# Patient Record
Sex: Male | Born: 1965 | Race: White | Hispanic: No | Marital: Married | Smoking: Former smoker
Health system: Southern US, Community
[De-identification: ages and names within clinical notes are randomized; demographics above are authoritative.]

## PROBLEM LIST (undated history)

## (undated) DIAGNOSIS — F32A Depression, unspecified: Secondary | ICD-10-CM

## (undated) DIAGNOSIS — F419 Anxiety disorder, unspecified: Secondary | ICD-10-CM

## (undated) DIAGNOSIS — L509 Urticaria, unspecified: Secondary | ICD-10-CM

## (undated) DIAGNOSIS — G4730 Sleep apnea, unspecified: Secondary | ICD-10-CM

## (undated) DIAGNOSIS — M2550 Pain in unspecified joint: Secondary | ICD-10-CM

## (undated) DIAGNOSIS — J45909 Unspecified asthma, uncomplicated: Secondary | ICD-10-CM

## (undated) DIAGNOSIS — M459 Ankylosing spondylitis of unspecified sites in spine: Secondary | ICD-10-CM

## (undated) DIAGNOSIS — F988 Other specified behavioral and emotional disorders with onset usually occurring in childhood and adolescence: Secondary | ICD-10-CM

## (undated) DIAGNOSIS — M549 Dorsalgia, unspecified: Secondary | ICD-10-CM

## (undated) DIAGNOSIS — K589 Irritable bowel syndrome without diarrhea: Secondary | ICD-10-CM

## (undated) DIAGNOSIS — K760 Fatty (change of) liver, not elsewhere classified: Secondary | ICD-10-CM

## (undated) DIAGNOSIS — F514 Sleep terrors [night terrors]: Secondary | ICD-10-CM

## (undated) DIAGNOSIS — F1990 Other psychoactive substance use, unspecified, uncomplicated: Secondary | ICD-10-CM

## (undated) DIAGNOSIS — F329 Major depressive disorder, single episode, unspecified: Secondary | ICD-10-CM

## (undated) DIAGNOSIS — F1010 Alcohol abuse, uncomplicated: Secondary | ICD-10-CM

## (undated) DIAGNOSIS — E039 Hypothyroidism, unspecified: Secondary | ICD-10-CM

## (undated) HISTORY — PX: WISDOM TOOTH EXTRACTION: SHX21

## (undated) HISTORY — DX: Sleep terrors (night terrors): F51.4

## (undated) HISTORY — PX: SINOSCOPY: SHX187

## (undated) HISTORY — DX: Fatty (change of) liver, not elsewhere classified: K76.0

## (undated) HISTORY — DX: Other specified behavioral and emotional disorders with onset usually occurring in childhood and adolescence: F98.8

## (undated) HISTORY — PX: ADENOIDECTOMY: SUR15

## (undated) HISTORY — DX: Alcohol abuse, uncomplicated: F10.10

## (undated) HISTORY — DX: Urticaria, unspecified: L50.9

## (undated) HISTORY — PX: TONSILLECTOMY: SUR1361

## (undated) HISTORY — DX: Dorsalgia, unspecified: M54.9

## (undated) HISTORY — DX: Other psychoactive substance use, unspecified, uncomplicated: F19.90

## (undated) HISTORY — PX: KNEE SURGERY: SHX244

## (undated) HISTORY — DX: Irritable bowel syndrome, unspecified: K58.9

## (undated) HISTORY — DX: Pain in unspecified joint: M25.50

---

## 1990-01-13 HISTORY — PX: TONSILLECTOMY AND ADENOIDECTOMY: SHX28

## 2001-12-23 ENCOUNTER — Emergency Department (HOSPITAL_COMMUNITY): Admission: EM | Admit: 2001-12-23 | Discharge: 2001-12-23 | Payer: Self-pay | Admitting: *Deleted

## 2009-03-26 ENCOUNTER — Encounter: Admission: RE | Admit: 2009-03-26 | Discharge: 2009-03-26 | Payer: Self-pay | Admitting: Family Medicine

## 2009-04-04 ENCOUNTER — Encounter: Admission: RE | Admit: 2009-04-04 | Discharge: 2009-04-04 | Payer: Self-pay | Admitting: Family Medicine

## 2010-01-23 ENCOUNTER — Ambulatory Visit
Admission: RE | Admit: 2010-01-23 | Discharge: 2010-01-23 | Payer: Self-pay | Source: Home / Self Care | Attending: Orthopedic Surgery | Admitting: Orthopedic Surgery

## 2010-01-28 LAB — POCT HEMOGLOBIN-HEMACUE: Hemoglobin: 16.4 g/dL (ref 13.0–17.0)

## 2010-02-01 NOTE — Op Note (Signed)
NAMESELSO, MANNOR NO.:  [id number]  MEDICAL RECORD NO.:  [id number]          PATIENT TYPE:  AMB  LOCATION:  DSC                          FACILITY:  [facility]  PHYSICIAN:  Harvie Junior, M.D.   DATE OF BIRTH:  10-Mar-1965  DATE OF PROCEDURE:  01/23/2010 DATE OF DISCHARGE:                              OPERATIVE REPORT   This is a 45 year old man.  PREOPERATIVE DIAGNOSIS:  Medial meniscal tear with suspected proliferative synovial process.  POSTOPERATIVE DIAGNOSES: 1. Medial meniscal tear. 2. Severe chondromalacia of the patellofemoral joint. 3. Proliferative synovium. 4. Medial shelf plica.  OPERATIVE PROCEDURES: 1. Partial posterior horn medial meniscectomy. 2. A chondroplasty of patellofemoral joint. 3. Extensive four-compartment synovectomy. 4. Excision of medial shelf plica.  SURGEON:  Harvie Junior, MD  ASSISTANT:  Marshia Ly, PA  ANESTHESIA:  General.  BRIEF HISTORY:  Mr. Scholze is a 45 year old male with a long history of having had significant pain in his left knee.  We treated him conservatively for a period time with aspiration, injection therapy, activity modification.  With failure of all of these issues and because of concerns of his rheumatologist that he could have some proliferative synovial process, he was ultimately taken to the operating room for evaluation and fixation as needed.  PROCEDURE:  The patient was taken to operating room.  After adequate anesthesia obtained with general anesthetic, the patient was placed supine on the operating room table.  Left leg was examined under anesthesia and noted to be stable in all directions.  He was noted to have a significant effusion at this time.  The patient was prepped and draped in usual sterile fashion.  Following this, routine arthroscopic examination revealed that there was significant amount of synovial fluid within the knee, it looked normal.  Attention was then turned  towards arthroscopy of the knee which showed that there was some significant proliferative synovitis up in the patellofemoral pouch, suprapatellar pouch, and the medial compartment, some with a large medial shelf plica. We went past the medial shelf plica and down into the medial compartment where we noted the patient to have some proliferative synovitis but not dramatic.  Attention was then turned back to the posterior horn of the medial meniscus where there was a complex tear of the posterior horn and the medial meniscus and this was trimmed back to a smooth and stable rim.  There was no real proliferative synovitis along the rim of the meniscus which we a lot of times will see with these proliferative synovitic conditions.  Attention was turned to the ACL which was normal. Attention was turned to lateral compartment, again some synovitis anteriorly and laterally but not again dramatic.  Attention was turned to the lateral compartment which was normal in terms of lateral meniscus and lateral femoral condyle.  Attention was turned back up into the patellofemoral joint where at this time we identified a severe problem with the trochlea.  There was a large delamination the articular cartilage of the trochlea.  This was debrided with a large biter under the delaminating edges back to a smooth and stable rim which  unfortunately left a fairly significant defect.  Patella did not look bad in terms of corresponding defect but in this trochlea was a fairly large defect, probably over a 2 x 3 sq cm area.  Once this was debrided, attention was turned to the proliferative synovial process.  We made a superomedial portal and pulled out synovium that we sent to pathology. We then used a shaver from the superomedial portal to get the superior suprapatellar pouch, took the camera in all three of these areas and basically did a synovectomy in superior patellar pouch, went into the medial gutter and did  extensive synovectomy there as well as debridement of medial shelf plica, went into the lateral gutter, did an extensive synovectomy there and then anteriorly in the knee, we did a synovectomy as well.  Once this was completed, the knee was copiously and thoroughly lavaged and suctioned dry.  Final check was made for any loose fragment, piece of cartilage, seen none.  The knee was again irrigated, suctioned dry and arthroscopic portals were closed with bandage.  Sterile compressive dressing was applied.  The patient was taken to recovery room in satisfactory condition.  Estimated blood loss for procedure was none.     Harvie Junior, M.D.     Ranae Plumber  D:  01/23/2010  T:  01/23/2010  Job:  [id number]  cc:   Sandria Bales. Ezzard Standing, M.D.  Electronically Signed by Jodi Geralds M.D. on 01/31/2010 08:59:36 PM

## 2010-02-03 ENCOUNTER — Encounter: Payer: Self-pay | Admitting: Family Medicine

## 2010-02-22 ENCOUNTER — Other Ambulatory Visit: Payer: Self-pay | Admitting: Family Medicine

## 2010-02-22 DIAGNOSIS — R109 Unspecified abdominal pain: Secondary | ICD-10-CM

## 2010-02-25 ENCOUNTER — Other Ambulatory Visit: Payer: Self-pay

## 2010-03-01 ENCOUNTER — Other Ambulatory Visit: Payer: Self-pay

## 2010-03-18 ENCOUNTER — Ambulatory Visit
Admission: RE | Admit: 2010-03-18 | Discharge: 2010-03-18 | Disposition: A | Discharge status: Home / Self Care | Payer: BC Managed Care – PPO | Source: Ambulatory Visit | Attending: Family Medicine | Admitting: Family Medicine

## 2010-03-18 DIAGNOSIS — R109 Unspecified abdominal pain: Secondary | ICD-10-CM

## 2010-03-18 MED ORDER — IOHEXOL 300 MG/ML  SOLN
125.0000 mL | Freq: Once | INTRAMUSCULAR | Status: AC | PRN
  Administered 2010-03-18: 125 mL via INTRAVENOUS

## 2014-03-09 ENCOUNTER — Other Ambulatory Visit: Payer: Self-pay | Admitting: Family Medicine

## 2014-03-09 DIAGNOSIS — M545 Low back pain: Secondary | ICD-10-CM

## 2014-03-18 ENCOUNTER — Other Ambulatory Visit: Payer: Self-pay

## 2014-03-26 ENCOUNTER — Other Ambulatory Visit: Payer: Self-pay

## 2014-03-29 ENCOUNTER — Encounter: Payer: Self-pay | Admitting: Neurology

## 2014-03-29 ENCOUNTER — Ambulatory Visit (INDEPENDENT_AMBULATORY_CARE_PROVIDER_SITE_OTHER): Payer: BLUE CROSS/BLUE SHIELD | Admitting: Neurology

## 2014-03-29 VITALS — BP 131/75 | HR 83 | Resp 18 | Ht 73.0 in | Wt 260.0 lb

## 2014-03-29 DIAGNOSIS — F514 Sleep terrors [night terrors]: Secondary | ICD-10-CM | POA: Diagnosis not present

## 2014-03-29 DIAGNOSIS — G43909 Migraine, unspecified, not intractable, without status migrainosus: Secondary | ICD-10-CM | POA: Insufficient documentation

## 2014-03-29 DIAGNOSIS — E669 Obesity, unspecified: Secondary | ICD-10-CM | POA: Diagnosis not present

## 2014-03-29 DIAGNOSIS — G43009 Migraine without aura, not intractable, without status migrainosus: Secondary | ICD-10-CM

## 2014-03-29 DIAGNOSIS — R0683 Snoring: Secondary | ICD-10-CM | POA: Diagnosis not present

## 2014-03-29 HISTORY — DX: Sleep terrors (night terrors): F51.4

## 2014-03-29 MED ORDER — CLONAZEPAM 0.5 MG PO TABS: 0.5000 mg | ORAL_TABLET | Freq: Every day | ORAL | Status: DC
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
## 2014-04-27 ENCOUNTER — Ambulatory Visit (INDEPENDENT_AMBULATORY_CARE_PROVIDER_SITE_OTHER): Payer: BLUE CROSS/BLUE SHIELD | Admitting: Neurology

## 2014-04-27 DIAGNOSIS — F514 Sleep terrors [night terrors]: Secondary | ICD-10-CM | POA: Diagnosis not present

## 2014-04-27 DIAGNOSIS — R0683 Snoring: Secondary | ICD-10-CM

## 2014-04-27 DIAGNOSIS — G43009 Migraine without aura, not intractable, without status migrainosus: Secondary | ICD-10-CM

## 2014-04-27 DIAGNOSIS — E669 Obesity, unspecified: Secondary | ICD-10-CM

## 2014-04-28 NOTE — Sleep Study (Signed)
See Scanned Documents in Encounters tab

## 2014-05-16 ENCOUNTER — Telehealth: Payer: Self-pay

## 2014-05-16 DIAGNOSIS — G4733 Obstructive sleep apnea (adult) (pediatric): Secondary | ICD-10-CM

## 2014-05-16 NOTE — Telephone Encounter (Signed)
Called pt to give results of sleep study. No answer. Left message on machine.

## 2014-05-19 NOTE — Telephone Encounter (Signed)
Pt called back, and I relayed pt his sleep study results. Informed him of the severe osa and hypoexemia, and that cpap treatment is recommended. Cpap titration study is advised. Pt wishes to proceed. Will place order. Encouraged to call back with any questions or concerns.

## 2014-05-19 NOTE — Telephone Encounter (Signed)
Patient calling you back . I relayed to patient the office closes today at 12:00 and you would give him a call back in between patient's . Patient understood and was fine with this.

## 2014-06-05 ENCOUNTER — Ambulatory Visit (INDEPENDENT_AMBULATORY_CARE_PROVIDER_SITE_OTHER): Payer: BLUE CROSS/BLUE SHIELD | Admitting: Neurology

## 2014-06-05 DIAGNOSIS — G4733 Obstructive sleep apnea (adult) (pediatric): Secondary | ICD-10-CM | POA: Diagnosis not present

## 2014-06-06 NOTE — Sleep Study (Signed)
See scanned documents in Encounters tab 

## 2014-06-15 ENCOUNTER — Telehealth: Payer: Self-pay

## 2014-06-15 DIAGNOSIS — G4733 Obstructive sleep apnea (adult) (pediatric): Secondary | ICD-10-CM

## 2014-06-15 NOTE — Telephone Encounter (Signed)
Called pt with sleep study results. Informed him that Dr. Vickey Hugerohmeier recommends a cpap. He is agreeable. Insurance is [employer], and he has no preference on DME company. Will send to [facility]. Encouraged pt to wear the cpap 4 or more hours nightly, and to bring his cpap to all appts with us. F/u appt for insurance requirements made for 8/2, pt knows to get here 15 minutes early.

## 2014-07-05 ENCOUNTER — Telehealth: Payer: Self-pay | Admitting: Neurology

## 2014-07-05 NOTE — Telephone Encounter (Signed)
Patient called and requested to speak with the nurse regarding his CPAP machine. Please call and advise.

## 2014-07-06 NOTE — Telephone Encounter (Signed)
Returned pt's phone call. He says he has not heard anything from [facility] regarding his cpap. I called [facility] and they confirmed that they had left three voicemails on the pt's cell phone and he had not returned them. I confirmed the pt's phone number with [facility] and it was the same one we have. Asked [facility] to please try calling the pt again. They said they would.

## 2014-08-16 ENCOUNTER — Encounter: Payer: Self-pay | Admitting: Neurology

## 2014-08-16 ENCOUNTER — Ambulatory Visit (INDEPENDENT_AMBULATORY_CARE_PROVIDER_SITE_OTHER): Payer: BLUE CROSS/BLUE SHIELD | Admitting: Neurology

## 2014-08-16 VITALS — BP 120/82 | HR 74 | Resp 20 | Ht 73.0 in | Wt 262.0 lb

## 2014-08-16 DIAGNOSIS — F901 Attention-deficit hyperactivity disorder, predominantly hyperactive type: Secondary | ICD-10-CM | POA: Insufficient documentation

## 2014-08-16 DIAGNOSIS — Z9989 Dependence on other enabling machines and devices: Secondary | ICD-10-CM

## 2014-08-16 DIAGNOSIS — G4733 Obstructive sleep apnea (adult) (pediatric): Secondary | ICD-10-CM | POA: Insufficient documentation

## 2014-08-16 DIAGNOSIS — G4730 Sleep apnea, unspecified: Secondary | ICD-10-CM

## 2014-08-16 DIAGNOSIS — G4710 Hypersomnia, unspecified: Secondary | ICD-10-CM | POA: Diagnosis not present

## 2014-08-16 MED ORDER — METHYLPHENIDATE HCL 10 MG PO TABS: 10.0000 mg | ORAL_TABLET | Freq: Two times a day (BID) | ORAL | Status: DC

## 2014-08-16 MED ORDER — CLONAZEPAM 0.5 MG PO TABS: 0.2500 mg | ORAL_TABLET | Freq: Every day | ORAL | Status: DC

## 2014-08-16 NOTE — Patient Instructions (Signed)
Methylphenidate tablets What is this medicine? METHYLPHENIDATE (meth il FEN i date) is used to treat attention-deficit hyperactivity disorder (ADHD). It is also used to treat narcolepsy. This medicine may be used for other purposes; ask your health care provider or pharmacist if you have questions. COMMON BRAND NAME(S): Methylin, Ritalin What should I tell my health care provider before I take this medicine? They need to know if you have any of these conditions: -anxiety or panic attacks -circulation problems in fingers and toes -glaucoma -hardening or blockages of the arteries or heart blood vessels -heart disease or a heart defect -high blood pressure -history of a drug or alcohol abuse problem -history of stroke -liver disease -mental illness -motor tics, family history or diagnosis of Tourette's syndrome -seizures -suicidal thoughts, plans, or attempt; a previous suicide attempt by you or a family member -thyroid disease -an unusual or allergic reaction to methylphenidate, other medicines, foods, dyes, or preservatives -pregnant or trying to get pregnant -breast-feeding How should I use this medicine? Take this medicine by mouth with a glass of water. Follow the directions on the prescription label. It is best to take this medicine 30 to 45 minutes before meals, unless your doctor tells you otherwise. Take your medicine at regular intervals. Usually the last dose of the day will be taken at least 4 to 6 hours before bedtime, so it will not interfere with sleep. Do not take your medicine more often than directed. A special MedGuide will be given to you by the pharmacist with each prescription and refill. Be sure to read this information carefully each time. Talk to your pediatrician regarding the use of this medicine in children. While this drug may be prescribed for children as young as 6 years of age for selected conditions, precautions do apply. Overdosage: If you think you have  taken too much of this medicine contact a poison control center or emergency room at once. NOTE: This medicine is only for you. Do not share this medicine with others. What if I miss a dose? If you miss a dose, take it as soon as you can. If it is almost time for your next dose, take only that dose. Do not take double or extra doses. What may interact with this medicine? Do not take this medicine with any of the following medications: -lithium -MAOIs like Carbex, Eldepryl, Marplan, Nardil, and Parnate -other stimulant medicines for attention disorders, weight loss, or to stay awake -procarbazine This medicine may also interact with the following medications: -atomoxetine -caffeine -certain medicines for blood pressure, heart disease, irregular heart beat -certain medicines for depression, anxiety, or psychotic disturbances -certain medicines for seizures like carbamazepine, phenobarbital, phenytoin -cold or allergy medicines -warfarin This list may not describe all possible interactions. Give your health care provider a list of all the medicines, herbs, non-prescription drugs, or dietary supplements you use. Also tell them if you smoke, drink alcohol, or use illegal drugs. Some items may interact with your medicine. What should I watch for while using this medicine? Visit your doctor or health care professional for regular checks on your progress. This prescription requires that you follow special procedures with your doctor and pharmacy. You will need to have a new written prescription from your doctor or health care professional every time you need a refill. This medicine may affect your concentration, or hide signs of tiredness. Until you know how this drug affects you, do not drive, ride a bicycle, use machinery, or do anything that needs mental alertness.   Tell your doctor or health care professional if this medicine loses its effects, or if you feel you need to take more than the  prescribed amount. Do not change the dosage without talking to your doctor or health care professional. For males, contact your doctor or health care professional right away if you have an erection that lasts longer than 4 hours or if it becomes painful. This may be a sign of a serious problem and must be treated right away to prevent permanent damage. Decreased appetite is a common side effect when starting this medicine. Eating small, frequent meals or snacks can help. Talk to your doctor if you continue to have poor eating habits. Height and weight growth of a child taking this medicine will be monitored closely. Do not take this medicine close to bedtime. It may prevent you from sleeping. If you are going to need surgery, a MRI, CT scan, or other procedure, tell your doctor that you are taking this medicine. You may need to stop taking this medicine before the procedure. Tell your doctor or healthcare professional right away if you notice unexplained wounds on your fingers and toes while taking this medicine. You should also tell your healthcare provider if you experience numbness or pain, changes in the skin color, or sensitivity to temperature in your fingers or toes. What side effects may I notice from receiving this medicine? Side effects that you should report to your doctor or health care professional as soon as possible: -allergic reactions like skin rash, itching or hives, swelling of the face, lips, or tongue -changes in vision -chest pain or chest tightness -fast, irregular heartbeat -fingers or toes feel numb, cool, painful -hallucination, loss of contact with reality -high blood pressure -males: prolonged or painful erection -seizures -severe headaches -shortness of breath -suicidal thoughts or other mood changes -trouble walking, dizziness, loss of balance or coordination -uncontrollable head, mouth, neck, arm, or leg movements -unusual bleeding or bruising Side effects that  usually do not require medical attention (report to your doctor or health care professional if they continue or are bothersome): -anxious -headache -loss of appetite -nausea, vomiting -trouble sleeping -weight loss This list may not describe all possible side effects. Call your doctor for medical advice about side effects. You may report side effects to FDA at 1-800-FDA-1088. Where should I keep my medicine? Keep out of the reach of children. This medicine can be abused. Keep your medicine in a safe place to protect it from theft. Do not share this medicine with anyone. Selling or giving away this medicine is dangerous and against the law. Store at room temperature between 15 and 30 degrees C (59 and 86 degrees F). Protect from light and moisture. Keep container tightly closed. Throw away any unused medicine after the expiration date. NOTE: This sheet is a summary. It may not cover all possible information. If you have questions about this medicine, talk to your doctor, pharmacist, or health care provider.  2015, Elsevier/Gold Standard. (2012-09-20 10:09:08)  
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
## 2014-08-29 ENCOUNTER — Other Ambulatory Visit: Payer: Self-pay

## 2014-08-29 ENCOUNTER — Telehealth: Payer: Self-pay | Admitting: Neurology

## 2014-08-29 DIAGNOSIS — Z9989 Dependence on other enabling machines and devices: Secondary | ICD-10-CM

## 2014-08-29 DIAGNOSIS — G4730 Sleep apnea, unspecified: Principal | ICD-10-CM

## 2014-08-29 DIAGNOSIS — G4733 Obstructive sleep apnea (adult) (pediatric): Secondary | ICD-10-CM

## 2014-08-29 DIAGNOSIS — F901 Attention-deficit hyperactivity disorder, predominantly hyperactive type: Secondary | ICD-10-CM

## 2014-08-29 DIAGNOSIS — G4710 Hypersomnia, unspecified: Secondary | ICD-10-CM

## 2014-08-29 MED ORDER — METHYLPHENIDATE HCL 10 MG PO TABS: 10.0000 mg | ORAL_TABLET | Freq: Two times a day (BID) | ORAL | Status: DC

## 2014-08-29 NOTE — Telephone Encounter (Signed)
Pt states that he has only been taking ritalin 10 mg once a day instead of twice a day as prescribed because the RX was written to only give him 30 tablets which only is 2 weeks worth. He hasn't felt any difference. He is requesting another RX for the ritalin with enough pills to take 2/day or perhaps a stronger dose. I advised pt that I would speak to Dr. Vickey Huger and call him back with her recommendations.

## 2014-08-29 NOTE — Telephone Encounter (Signed)
Patient is calling for a written Rx methylphenidate 10 mg.  He states he wonders if he needs a higher dosage as after the first day he could not tell a difference.  Please call.

## 2014-08-29 NOTE — Telephone Encounter (Signed)
Spoke to Dr. Vickey Huger. She said the pt should take the ritalin up to 2 times a day. She said to refill the ritalin for 60 tablets so pt will have 30 days worth of medication.  Spoke to pt and explained this to him. Pt verbalized understanding.

## 2014-09-18 ENCOUNTER — Emergency Department (HOSPITAL_COMMUNITY)
Admission: EM | Admit: 2014-09-18 | Discharge: 2014-09-18 | Disposition: A | Discharge status: Home / Self Care | Payer: BLUE CROSS/BLUE SHIELD | Attending: Emergency Medicine | Admitting: Emergency Medicine

## 2014-09-18 ENCOUNTER — Encounter (HOSPITAL_COMMUNITY): Payer: Self-pay | Admitting: Family Medicine

## 2014-09-18 DIAGNOSIS — Z8659 Personal history of other mental and behavioral disorders: Secondary | ICD-10-CM | POA: Diagnosis not present

## 2014-09-18 DIAGNOSIS — Y998 Other external cause status: Secondary | ICD-10-CM | POA: Diagnosis not present

## 2014-09-18 DIAGNOSIS — T63061A Toxic effect of venom of other North and South American snake, accidental (unintentional), initial encounter: Secondary | ICD-10-CM | POA: Insufficient documentation

## 2014-09-18 DIAGNOSIS — Y9289 Other specified places as the place of occurrence of the external cause: Secondary | ICD-10-CM | POA: Diagnosis not present

## 2014-09-18 DIAGNOSIS — Z79899 Other long term (current) drug therapy: Secondary | ICD-10-CM | POA: Diagnosis not present

## 2014-09-18 DIAGNOSIS — Y9389 Activity, other specified: Secondary | ICD-10-CM | POA: Diagnosis not present

## 2014-09-18 DIAGNOSIS — T63001A Toxic effect of unspecified snake venom, accidental (unintentional), initial encounter: Secondary | ICD-10-CM

## 2014-09-18 LAB — COMPREHENSIVE METABOLIC PANEL
ALT: 85 U/L — ABNORMAL HIGH (ref 17–63)
AST: 50 U/L — ABNORMAL HIGH (ref 15–41)
Albumin: 4.3 g/dL (ref 3.5–5.0)
Alkaline Phosphatase: 45 U/L (ref 38–126)
Anion gap: 8 (ref 5–15)
BUN: 17 mg/dL (ref 6–20)
CO2: 22 mmol/L (ref 22–32)
Calcium: 9.2 mg/dL (ref 8.9–10.3)
Chloride: 112 mmol/L — ABNORMAL HIGH (ref 101–111)
Creatinine, Ser: 1.08 mg/dL (ref 0.61–1.24)
GFR calc Af Amer: >60 mL/min (ref >60)
GFR calc non Af Amer: >60 mL/min (ref >60)
Glucose, Bld: 105 mg/dL — ABNORMAL HIGH (ref 65–99)
Potassium: 4.6 mmol/L (ref 3.5–5.1)
Sodium: 142 mmol/L (ref 135–145)
Total Bilirubin: 1 mg/dL (ref 0.3–1.2)
Total Protein: 6.7 g/dL (ref 6.5–8.1)

## 2014-09-18 LAB — CBC WITH DIFFERENTIAL/PLATELET
Basophils Absolute: 0 10*3/uL (ref 0.0–0.1)
Basophils Relative: 0 % (ref 0–1)
Eosinophils Absolute: 0.2 10*3/uL (ref 0.0–0.7)
Eosinophils Relative: 3 % (ref 0–5)
HCT: 45.2 % (ref 39.0–52.0)
Hemoglobin: 15.3 g/dL (ref 13.0–17.0)
Lymphocytes Relative: 30 % (ref 12–46)
Lymphs Abs: 1.8 10*3/uL (ref 0.7–4.0)
MCH: 28.4 pg (ref 26.0–34.0)
MCHC: 33.8 g/dL (ref 30.0–36.0)
MCV: 83.9 fL (ref 78.0–100.0)
Monocytes Absolute: 0.5 10*3/uL (ref 0.1–1.0)
Monocytes Relative: 9 % (ref 3–12)
Neutro Abs: 3.4 10*3/uL (ref 1.7–7.7)
Neutrophils Relative %: 58 % (ref 43–77)
Platelets: 176 10*3/uL (ref 150–400)
RBC: 5.39 MIL/uL (ref 4.22–5.81)
RDW: 13.4 % (ref 11.5–15.5)
WBC: 5.9 10*3/uL (ref 4.0–10.5)

## 2014-09-18 LAB — PROTIME-INR
INR: 1.1 (ref 0.00–1.49)
Prothrombin Time: 14.4 seconds (ref 11.6–15.2)

## 2014-09-18 LAB — FIBRINOGEN: Fibrinogen: 238 mg/dL (ref 204–475)

## 2014-09-18 MED ORDER — HYDROCODONE-ACETAMINOPHEN 5-325 MG PO TABS: 2.0000 | ORAL_TABLET | ORAL | Status: DC | PRN

## 2014-09-18 MED ORDER — ONDANSETRON 4 MG PO TBDP: 4.0000 mg | ORAL_TABLET | Freq: Three times a day (TID) | ORAL | Status: DC | PRN

## 2014-09-18 MED ORDER — OXYCODONE-ACETAMINOPHEN 5-325 MG PO TABS
1.0000 | ORAL_TABLET | Freq: Once | ORAL | Status: AC
  Administered 2014-09-18: 1 via ORAL
  Filled 2014-09-18: qty 1

## 2014-09-18 MED ORDER — DIPHENHYDRAMINE HCL 50 MG/ML IJ SOLN
25.0000 mg | Freq: Once | INTRAMUSCULAR | Status: AC
  Administered 2014-09-18: 25 mg via INTRAVENOUS
  Filled 2014-09-18: qty 1

## 2014-09-18 MED ORDER — CROTALIDAE POLYVAL IMMUNE FAB IV SOLR
4.0000 | Freq: Once | INTRAVENOUS | Status: DC
  Filled 2014-09-18: qty 72

## 2014-09-18 MED ORDER — SODIUM CHLORIDE 0.9 % IV SOLN
4.0000 | Freq: Once | INTRAVENOUS | Status: AC
  Administered 2014-09-18: 72 mL via INTRAVENOUS
  Filled 2014-09-18: qty 72

## 2014-09-18 MED ORDER — MORPHINE SULFATE (PF) 4 MG/ML IV SOLN
4.0000 mg | INTRAVENOUS | Status: DC | PRN
  Administered 2014-09-18 (×2): 4 mg via INTRAVENOUS
  Filled 2014-09-18 (×2): qty 1

## 2014-09-18 MED ORDER — ONDANSETRON HCL 4 MG/2ML IJ SOLN
4.0000 mg | Freq: Once | INTRAMUSCULAR | Status: AC
  Administered 2014-09-18: 4 mg via INTRAVENOUS
  Filled 2014-09-18: qty 2

## 2014-09-18 NOTE — ED Notes (Signed)
Wrist 20 cm FA 26 cm.

## 2014-09-18 NOTE — ED Notes (Addendum)
Pt FA measured at 28cm circumference

## 2014-09-18 NOTE — ED Notes (Signed)
Wrist: 20 Cm, FA: 26 Cm

## 2014-09-18 NOTE — ED Notes (Signed)
Spoke with Onalee Hua from poison control and gave him an update on patient status.

## 2014-09-18 NOTE — ED Notes (Signed)
Pt wrist measured at 21 cm

## 2014-09-18 NOTE — ED Provider Notes (Signed)
CSN: [id number]     Arrival date & time 09/18/14  1443 History   First MD Initiated Contact with Patient 09/18/14 1449     Chief Complaint  Patient presents with  . Snake Bite      HPI  Pt bitten by copperhead snake on right middle finger at 13:45.  Presents here via ems at 14:30.  Has noted increased pain and swelling with interval measurements every 15 minutes. Given IV pain meds by ems.  Right hand dominant.  Single strike.  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     Past Medical History  Diagnosis Date  . Night terrors   . Adult night terrors 03/29/2014   Past Surgical History  Procedure Laterality Date  . Tonsillectomy and adenoidectomy  1992  . Knee surgery Left   . Wisdom tooth extraction     Family History  Problem Relation Age of Onset  . Aneurysm Mother   . Stroke Father    Social History  Substance Use Topics  . Smoking status: Never Smoker   . Smokeless tobacco: Never Used  . Alcohol Use: 0.0 oz/week    0 Standard drinks or equivalent per week     Comment: Two drinks daily    Review of Systems  Constitutional: Negative for fever, chills, diaphoresis, appetite change and fatigue.  HENT: Negative for mouth sores, sore throat and trouble swallowing.   Eyes: Negative for visual disturbance.  Respiratory: Negative for cough, chest tightness, shortness of breath and wheezing.   Cardiovascular: Negative for chest pain.  Gastrointestinal: Negative for nausea, vomiting, abdominal pain, diarrhea and abdominal distention.  Endocrine: Negative for polydipsia, polyphagia and polyuria.  Genitourinary: Negative for dysuria, frequency and hematuria.  Musculoskeletal: Negative for gait problem.       Bite to RMF.  STS to all digits except thumb, and through wrist, to mid FA.  Soft compartments.  Immediate CRF. Normal sensation.  Limited ROM to flex fist 2/2 swelling.  Skin: Negative for color change, pallor and rash.   Neurological: Negative for dizziness, syncope, light-headedness and headaches.  Hematological: Does not bruise/bleed easily.  Psychiatric/Behavioral: Negative for behavioral problems and confusion.      Allergies  Review of patient's allergies indicates no known allergies.  Home Medications   Prior to Admission medications   Medication Sig Start Date End Date Taking? Authorizing Provider  clonazePAM (KLONOPIN) 0.5 MG tablet Take 0.5 tablets (0.25 mg total) by mouth at bedtime. Patient taking differently: Take 0.5 mg by mouth at bedtime.  08/16/14  Yes Porfirio Mylar Dohmeier, MD  cyclobenzaprine (FLEXERIL) 10 MG tablet Take 10 mg by mouth daily as needed for muscle spasms.  03/08/14  Yes Historical Provider, MD  HUMIRA PEN 40 MG/0.8ML PNKT Inject 40 mg into the skin See admin instructions. Twice monthly (1st & 15th) 07/26/14  Yes Historical Provider, MD  meloxicam (MOBIC) 15 MG tablet Take 15 mg by mouth as needed for pain.  03/08/14  Yes Historical Provider, MD  methylphenidate (RITALIN) 10 MG tablet Take 1 tablet (10 mg total) by mouth 2 (two) times daily. 08/29/14  Yes Carmen Dohmeier, MD  HYDROcodone-acetaminophen (NORCO/VICODIN) 5-325 MG per tablet Take 2 tablets by mouth every 4 (four) hours as needed. 09/18/14   Rolland Porter, MD  ondansetron (ZOFRAN ODT) 4 MG disintegrating tablet Take 1 tablet (4 mg total) by mouth every 8 (eight) hours as needed for nausea. 09/18/14   Rolland Porter, MD   BP  128/66 mmHg  Pulse 62  Temp(Src) 97.9 F (36.6 C) (Oral)  Resp 19  SpO2 96% Physical Exam  Constitutional: He is oriented to person, place, and time. He appears well-developed and well-nourished. No distress.  HENT:  Head: Normocephalic.  Eyes: Conjunctivae are normal. Pupils are equal, round, and reactive to light. No scleral icterus.  Neck: Normal range of motion. Neck supple. No thyromegaly present.  Cardiovascular: Normal rate and regular rhythm.  Exam reveals no gallop and no friction rub.   No murmur  heard. Pulmonary/Chest: Effort normal and breath sounds normal. No respiratory distress. He has no wheezes. He has no rales.  Abdominal: Soft. Bowel sounds are normal. He exhibits no distension. There is no tenderness. There is no rebound.  Musculoskeletal: Normal range of motion.  Bite to RMF.  STS to all digits except thumb, and extending to wrist and to mid FA.  Immediate CRF. + Sensation. Soft compartments.  Limited ROM to fisting 2/2 STS.  Neurological: He is alert and oriented to person, place, and time.  Skin: Skin is warm and dry. No rash noted.  Psychiatric: He has a normal mood and affect. His behavior is normal.    ED Course  Procedures (including critical care time) Labs Review Labs Reviewed  COMPREHENSIVE METABOLIC PANEL - Abnormal; Notable for the following:    Chloride 112 (*)    Glucose, Bld 105 (*)    AST 50 (*)    ALT 85 (*)    All other components within normal limits  CBC WITH DIFFERENTIAL/PLATELET  PROTIME-INR  FIBRINOGEN    Imaging Review No results found. I have personally reviewed and evaluated these images and lab results as part of my medical decision-making.   EKG Interpretation None      MDM   Final diagnoses:  Snake bite, accidental or unintentional, initial encounter    D/W RN at [facility] control.  With rapid STS over 90 minutes to mid forearm Crofab recommended.  D/W patient.  Ordered through pharmacy.  No contraindications.  CRITICAL CARE Performed by: Rolland Porter JOSEPH   Total critical care time: 33 Minutes for infusion of crofab  Critical care time was exclusive of separately billable procedures and treating other patients.  Critical care was necessary to treat or prevent imminent or life-threatening deterioration.  Critical care was time spent personally by me on the following activities: development of treatment plan with patient and/or surrogate as well as nursing, discussions with consultants, evaluation of patient's response to  treatment, examination of patient, obtaining history from patient or surrogate, ordering and performing treatments and interventions, ordering and review of laboratory studies, ordering and review of radiographic studies, pulse oximetry and re-evaluation of patient's condition. Care   Patient reevaluated multiple times. Multiple re-measurements obtained. Observed for 4 hours after completion of infusion of his CroFab. He is doing well. Is required only one additional dose of pain medication. His compartments remain soft. He has excellent capillary refill and sensation. His edema along all measurements including the hand, wrist, and forearm have improved. I again discussed him with poison control. He is appropriate for discharge home. Strict elevation precautions as I recheck with any worsening at home tonight.    Rolland Porter, MD 09/18/14 2308

## 2014-09-18 NOTE — ED Notes (Signed)
Forearm 26 cm. Wrist 20 cm. Pt able to move hand a little better than an hour ago.   

## 2014-09-18 NOTE — ED Notes (Signed)
Pt wrist and arm measured with no change. Pt having no reaction to medication. Started at 50cc an hour x 10 minutes and now CROFAB going at 250cc an hour.

## 2014-09-18 NOTE — ED Notes (Signed)
Pt here for snake bite to right arm x 1 hour ago. sts swelling radiating up arm. sts copper head bite,

## 2014-09-18 NOTE — ED Notes (Addendum)
Pt FA measured at 23 cm in circumference.

## 2014-09-18 NOTE — Discharge Instructions (Signed)
Snake Bite °Snakes may be either venomous (containing poison) or nonvenomous (nonpoisonous). A nonvenomous snake bite will cause trauma or a wound to the skin and possibly the deeper tissues. A venomous snake will also cause a traumatic wound, but more importantly, it may have injected venom into the wound. Snake bite venom can be extremely serious and even deadly. One type of venom may cause major skin, tissue and muscle damage, and failure of normal blood clotting. This may cause extreme swelling and pain of the affected area. Another type of venom can affect the brain and nervous system and may cause death. The treatment for venomous snake bite may require the use of antivenom medicine. If you are unsure if your bite is from a venomous snake, you MUST seek immediate medical attention. °YOU MIGHT NEED A TETANUS SHOT NOW IF: °· You have no idea when you had the last one. °· You have never had a tetanus shot before. °· The bite broke your skin. °If you need a tetanus shot, and you decide not to get one, there is a rare chance of getting tetanus. Sickness from tetanus can be serious. °HOME CARE INSTRUCTIONS  °A snake bit you and caused a skin wound. It may or may not have been venomous. If the snake was venomous, a small amount of venom may have been injected into your skin. °· Keep the bite area clean and dry. °· Keep the extremity elevated above the level of the heart for the next 48 hours. °· Wash the bite area 3 times daily with soap and water or an antiseptic. Apply an adhesive or gauze bandage to the bite area. °· If you develop blistering of any type at the site of the bite, protect the blisters from breaking. Do not attempt to open it. °· If you were given a tetanus shot, your arm may get swollen, red and warm at the shot site. This is a common response to the injection. °SEEK IMMEDIATE MEDICAL CARE IF:  °· You develop symptoms of poisoning including increased pain, redness, swelling, blood blisters or purple  spots in the bite area, nausea, vomiting, numbness, tingling, excessive sweating, breathing difficulty, blurred vision, feelings of lightheadedness, or feeling faint. If you develop symptoms of poisoning, you MUST seek immediate medical attention. °· The bite becomes infected. Symptoms may include redness, swelling, pain, tenderness, pus, red streaks running from the wound, or an oral temperature above 102° F (38.9° C), not controlled by medicine. °· Your condition or wound becomes worse. °MAKE SURE YOU:  °· Understand these instructions. °· Will watch your condition. °· Will get help right away if you are not doing well or get worse. °Document Released: 12/28/1999 Document Revised: 03/24/2011 Document Reviewed: 05/23/2009 °[employer]® Patient Information ©2015 [employer]. This information is not intended to replace advice given to you by your health care provider. Make sure you discuss any questions you have with your health care provider. ° °

## 2014-09-18 NOTE — ED Notes (Signed)
Forearm 26 cm. Wrist 20 cm. Pt able to move hand a little better than an hour ago.

## 2014-09-18 NOTE — ED Notes (Signed)
Patient's wrist measurement. Wrist: 20.4 Cm. FA: 26.6 cm

## 2014-09-20 ENCOUNTER — Encounter: Payer: Self-pay | Admitting: Neurology

## 2014-09-20 ENCOUNTER — Ambulatory Visit (INDEPENDENT_AMBULATORY_CARE_PROVIDER_SITE_OTHER): Payer: BLUE CROSS/BLUE SHIELD | Admitting: Neurology

## 2014-09-20 VITALS — BP 139/85 | HR 72 | Resp 20 | Ht 73.0 in | Wt 267.0 lb

## 2014-09-20 DIAGNOSIS — G4730 Sleep apnea, unspecified: Secondary | ICD-10-CM

## 2014-09-20 DIAGNOSIS — F901 Attention-deficit hyperactivity disorder, predominantly hyperactive type: Secondary | ICD-10-CM | POA: Diagnosis not present

## 2014-09-20 DIAGNOSIS — G4710 Hypersomnia, unspecified: Secondary | ICD-10-CM

## 2014-09-20 DIAGNOSIS — G4733 Obstructive sleep apnea (adult) (pediatric): Secondary | ICD-10-CM | POA: Diagnosis not present

## 2014-09-20 DIAGNOSIS — M5442 Lumbago with sciatica, left side: Secondary | ICD-10-CM | POA: Diagnosis not present

## 2014-09-20 DIAGNOSIS — F514 Sleep terrors [night terrors]: Secondary | ICD-10-CM | POA: Diagnosis not present

## 2014-09-20 DIAGNOSIS — Z9989 Dependence on other enabling machines and devices: Principal | ICD-10-CM

## 2014-09-20 MED ORDER — METHYLPHENIDATE HCL 10 MG PO TABS: 10.0000 mg | ORAL_TABLET | Freq: Two times a day (BID) | ORAL | Status: DC

## 2014-09-20 MED ORDER — HYDROCODONE-ACETAMINOPHEN 5-325 MG PO TABS: 2.0000 | ORAL_TABLET | ORAL | Status: DC | PRN

## 2014-09-20 MED ORDER — CLONAZEPAM 0.5 MG PO TABS: 0.5000 mg | ORAL_TABLET | Freq: Every day | ORAL | Status: DC

## 2014-09-20 NOTE — Patient Instructions (Signed)
Sciatica with Rehab The sciatic nerve runs from the back down the leg and is responsible for sensation and control of the muscles in the back (posterior) side of the thigh, lower leg, and foot. Sciatica is a condition that is characterized by inflammation of this nerve.  SYMPTOMS   Signs of nerve damage, including numbness and/or weakness along the posterior side of the lower extremity.  Pain in the back of the thigh that may also travel down the leg.  Pain that worsens when sitting for long periods of time.  Occasionally, pain in the back or buttock. CAUSES  Inflammation of the sciatic nerve is the cause of sciatica. The inflammation is due to something irritating the nerve. Common sources of irritation include:  Sitting for long periods of time.  Direct trauma to the nerve.  Arthritis of the spine.  Herniated or ruptured disk.  Slipping of the vertebrae (spondylolisthesis).  Pressure from soft tissues, such as muscles or ligament-like tissue (fascia). RISK INCREASES WITH:  Sports that place pressure or stress on the spine (football or weightlifting).  Poor strength and flexibility.  Failure to warm up properly before activity.  Family history of low back pain or disk disorders.  Previous back injury or surgery.  Poor body mechanics, especially when lifting, or poor posture. PREVENTION   Warm up and stretch properly before activity.  Maintain physical fitness:  Strength, flexibility, and endurance.  Cardiovascular fitness.  Learn and use proper technique, especially with posture and lifting. When possible, have coach correct improper technique.  Avoid activities that place stress on the spine. PROGNOSIS If treated properly, then sciatica usually resolves within 6 weeks. However, occasionally surgery is necessary.  RELATED COMPLICATIONS   Permanent nerve damage, including pain, numbness, tingle, or weakness.  Chronic back pain.  Risks of surgery: infection,  bleeding, nerve damage, or damage to surrounding tissues. TREATMENT Treatment initially involves resting from any activities that aggravate your symptoms. The use of ice and medication may help reduce pain and inflammation. The use of strengthening and stretching exercises may help reduce pain with activity. These exercises may be performed at home or with referral to a therapist. A therapist may recommend further treatments, such as transcutaneous electronic nerve stimulation (TENS) or ultrasound. Your caregiver may recommend corticosteroid injections to help reduce inflammation of the sciatic nerve. If symptoms persist despite non-surgical (conservative) treatment, then surgery may be recommended. MEDICATION  If pain medication is necessary, then nonsteroidal anti-inflammatory medications, such as aspirin and ibuprofen, or other minor pain relievers, such as acetaminophen, are often recommended.  Do not take pain medication for 7 days before surgery.  Prescription pain relievers may be given if deemed necessary by your caregiver. Use only as directed and only as much as you need.  Ointments applied to the skin may be helpful.  Corticosteroid injections may be given by your caregiver. These injections should be reserved for the most serious cases, because they may only be given a certain number of times. HEAT AND COLD  Cold treatment (icing) relieves pain and reduces inflammation. Cold treatment should be applied for 10 to 15 minutes every 2 to 3 hours for inflammation and pain and immediately after any activity that aggravates your symptoms. Use ice packs or massage the area with a piece of ice (ice massage).  Heat treatment may be used prior to performing the stretching and strengthening activities prescribed by your caregiver, physical therapist, or athletic trainer. Use a heat pack or soak the injury in warm water.   SEEK MEDICAL CARE IF:  Treatment seems to offer no benefit, or the condition  worsens.  Any medications produce adverse side effects. EXERCISES  RANGE OF MOTION (ROM) AND STRETCHING EXERCISES - Sciatica Most people with sciatic will find that their symptoms worsen with either excessive bending forward (flexion) or arching at the low back (extension). The exercises which will help resolve your symptoms will focus on the opposite motion. Your physician, physical therapist or athletic trainer will help you determine which exercises will be most helpful to resolve your low back pain. Do not complete any exercises without first consulting with your clinician. Discontinue any exercises which worsen your symptoms until you speak to your clinician. If you have pain, numbness or tingling which travels down into your buttocks, leg or foot, the goal of the therapy is for these symptoms to move closer to your back and eventually resolve. Occasionally, these leg symptoms will get better, but your low back pain may worsen; this is typically an indication of progress in your rehabilitation. Be certain to be very alert to any changes in your symptoms and the activities in which you participated in the 24 hours prior to the change. Sharing this information with your clinician will allow him/her to most efficiently treat your condition. These exercises may help you when beginning to rehabilitate your injury. Your symptoms may resolve with or without further involvement from your physician, physical therapist or athletic trainer. While completing these exercises, remember:   Restoring tissue flexibility helps normal motion to return to the joints. This allows healthier, less painful movement and activity.  An effective stretch should be held for at least 30 seconds.  A stretch should never be painful. You should only feel a gentle lengthening or release in the stretched tissue. FLEXION RANGE OF MOTION AND STRETCHING EXERCISES: STRETCH - Flexion, Single Knee to Chest   Lie on a firm bed or floor  with both legs extended in front of you.  Keeping one leg in contact with the floor, bring your opposite knee to your chest. Hold your leg in place by either grabbing behind your thigh or at your knee.  Pull until you feel a gentle stretch in your low back. Hold __________ seconds.  Slowly release your grasp and repeat the exercise with the opposite side. Repeat __________ times. Complete this exercise __________ times per day.  STRETCH - Flexion, Double Knee to Chest  Lie on a firm bed or floor with both legs extended in front of you.  Keeping one leg in contact with the floor, bring your opposite knee to your chest.  Tense your stomach muscles to support your back and then lift your other knee to your chest. Hold your legs in place by either grabbing behind your thighs or at your knees.  Pull both knees toward your chest until you feel a gentle stretch in your low back. Hold __________ seconds.  Tense your stomach muscles and slowly return one leg at a time to the floor. Repeat __________ times. Complete this exercise __________ times per day.  STRETCH - Low Trunk Rotation   Lie on a firm bed or floor. Keeping your legs in front of you, bend your knees so they are both pointed toward the ceiling and your feet are flat on the floor.  Extend your arms out to the side. This will stabilize your upper body by keeping your shoulders in contact with the floor.  Gently and slowly drop both knees together to one side until   you feel a gentle stretch in your low back. Hold for __________ seconds.  Tense your stomach muscles to support your low back as you bring your knees back to the starting position. Repeat the exercise to the other side. Repeat __________ times. Complete this exercise __________ times per day  EXTENSION RANGE OF MOTION AND FLEXIBILITY EXERCISES: STRETCH - Extension, Prone on Elbows  Lie on your stomach on the floor, a bed will be too soft. Place your palms about shoulder  width apart and at the height of your head.  Place your elbows under your shoulders. If this is too painful, stack pillows under your chest.  Allow your body to relax so that your hips drop lower and make contact more completely with the floor.  Hold this position for __________ seconds.  Slowly return to lying flat on the floor. Repeat __________ times. Complete this exercise __________ times per day.  RANGE OF MOTION - Extension, Prone Press Ups  Lie on your stomach on the floor, a bed will be too soft. Place your palms about shoulder width apart and at the height of your head.  Keeping your back as relaxed as possible, slowly straighten your elbows while keeping your hips on the floor. You may adjust the placement of your hands to maximize your comfort. As you gain motion, your hands will come more underneath your shoulders.  Hold this position __________ seconds.  Slowly return to lying flat on the floor. Repeat __________ times. Complete this exercise __________ times per day.  STRENGTHENING EXERCISES - Sciatica  These exercises may help you when beginning to rehabilitate your injury. These exercises should be done near your "sweet spot." This is the neutral, low-back arch, somewhere between fully rounded and fully arched, that is your least painful position. When performed in this safe range of motion, these exercises can be used for people who have either a flexion or extension based injury. These exercises may resolve your symptoms with or without further involvement from your physician, physical therapist or athletic trainer. While completing these exercises, remember:   Muscles can gain both the endurance and the strength needed for everyday activities through controlled exercises.  Complete these exercises as instructed by your physician, physical therapist or athletic trainer. Progress with the resistance and repetition exercises only as your caregiver advises.  You may  experience muscle soreness or fatigue, but the pain or discomfort you are trying to eliminate should never worsen during these exercises. If this pain does worsen, stop and make certain you are following the directions exactly. If the pain is still present after adjustments, discontinue the exercise until you can discuss the trouble with your clinician. STRENGTHENING - Deep Abdominals, Pelvic Tilt   Lie on a firm bed or floor. Keeping your legs in front of you, bend your knees so they are both pointed toward the ceiling and your feet are flat on the floor.  Tense your lower abdominal muscles to press your low back into the floor. This motion will rotate your pelvis so that your tail bone is scooping upwards rather than pointing at your feet or into the floor.  With a gentle tension and even breathing, hold this position for __________ seconds. Repeat __________ times. Complete this exercise __________ times per day.  STRENGTHENING - Abdominals, Crunches   Lie on a firm bed or floor. Keeping your legs in front of you, bend your knees so they are both pointed toward the ceiling and your feet are flat on the   floor. Cross your arms over your chest.  Slightly tip your chin down without bending your neck.  Tense your abdominals and slowly lift your trunk high enough to just clear your shoulder blades. Lifting higher can put excessive stress on the low back and does not further strengthen your abdominal muscles.  Control your return to the starting position. Repeat __________ times. Complete this exercise __________ times per day.  STRENGTHENING - Quadruped, Opposite UE/LE Lift  Assume a hands and knees position on a firm surface. Keep your hands under your shoulders and your knees under your hips. You may place padding under your knees for comfort.  Find your neutral spine and gently tense your abdominal muscles so that you can maintain this position. Your shoulders and hips should form a rectangle  that is parallel with the floor and is not twisted.  Keeping your trunk steady, lift your right hand no higher than your shoulder and then your left leg no higher than your hip. Make sure you are not holding your breath. Hold this position __________ seconds.  Continuing to keep your abdominal muscles tense and your back steady, slowly return to your starting position. Repeat with the opposite arm and leg. Repeat __________ times. Complete this exercise __________ times per day.  STRENGTHENING - Abdominals and Quadriceps, Straight Leg Raise   Lie on a firm bed or floor with both legs extended in front of you.  Keeping one leg in contact with the floor, bend the other knee so that your foot can rest flat on the floor.  Find your neutral spine, and tense your abdominal muscles to maintain your spinal position throughout the exercise.  Slowly lift your straight leg off the floor about 6 inches for a count of 15, making sure to not hold your breath.  Still keeping your neutral spine, slowly lower your leg all the way to the floor. Repeat this exercise with each leg __________ times. Complete this exercise __________ times per day. POSTURE AND BODY MECHANICS CONSIDERATIONS - Sciatica Keeping correct posture when sitting, standing or completing your activities will reduce the stress put on different body tissues, allowing injured tissues a chance to heal and limiting painful experiences. The following are general guidelines for improved posture. Your physician or physical therapist will provide you with any instructions specific to your needs. While reading these guidelines, remember:  The exercises prescribed by your provider will help you have the flexibility and strength to maintain correct postures.  The correct posture provides the optimal environment for your joints to work. All of your joints have less wear and tear when properly supported by a spine with good posture. This means you will  experience a healthier, less painful body.  Correct posture must be practiced with all of your activities, especially prolonged sitting and standing. Correct posture is as important when doing repetitive low-stress activities (typing) as it is when doing a single heavy-load activity (lifting). RESTING POSITIONS Consider which positions are most painful for you when choosing a resting position. If you have pain with flexion-based activities (sitting, bending, stooping, squatting), choose a position that allows you to rest in a less flexed posture. You would want to avoid curling into a fetal position on your side. If your pain worsens with extension-based activities (prolonged standing, working overhead), avoid resting in an extended position such as sleeping on your stomach. Most people will find more comfort when they rest with their spine in a more neutral position, neither too rounded nor too   arched. Lying on a non-sagging bed on your side with a pillow between your knees, or on your back with a pillow under your knees will often provide some relief. Keep in mind, being in any one position for a prolonged period of time, no matter how correct your posture, can still lead to stiffness. PROPER SITTING POSTURE In order to minimize stress and discomfort on your spine, you must sit with correct posture Sitting with good posture should be effortless for a healthy body. Returning to good posture is a gradual process. Many people can work toward this most comfortably by using various supports until they have the flexibility and strength to maintain this posture on their own. When sitting with proper posture, your ears will fall over your shoulders and your shoulders will fall over your hips. You should use the back of the chair to support your upper back. Your low back will be in a neutral position, just slightly arched. You may place a small pillow or folded towel at the base of your low back for support.  When  working at a desk, create an environment that supports good, upright posture. Without extra support, muscles fatigue and lead to excessive strain on joints and other tissues. Keep these recommendations in mind: CHAIR:   A chair should be able to slide under your desk when your back makes contact with the back of the chair. This allows you to work closely.  The chair's height should allow your eyes to be level with the upper part of your monitor and your hands to be slightly lower than your elbows. BODY POSITION  Your feet should make contact with the floor. If this is not possible, use a foot rest.  Keep your ears over your shoulders. This will reduce stress on your neck and low back. INCORRECT SITTING POSTURES   If you are feeling tired and unable to assume a healthy sitting posture, do not slouch or slump. This puts excessive strain on your back tissues, causing more damage and pain. Healthier options include:  Using more support, like a lumbar pillow.  Switching tasks to something that requires you to be upright or walking.  Talking a brief walk.  Lying down to rest in a neutral-spine position. PROLONGED STANDING WHILE SLIGHTLY LEANING FORWARD  When completing a task that requires you to lean forward while standing in one place for a long time, place either foot up on a stationary 2-4 inch high object to help maintain the best posture. When both feet are on the ground, the low back tends to lose its slight inward curve. If this curve flattens (or becomes too large), then the back and your other joints will experience too much stress, fatigue more quickly and can cause pain.  CORRECT STANDING POSTURES Proper standing posture should be assumed with all daily activities, even if they only take a few moments, like when brushing your teeth. As in sitting, your ears should fall over your shoulders and your shoulders should fall over your hips. You should keep a slight tension in your abdominal  muscles to brace your spine. Your tailbone should point down to the ground, not behind your body, resulting in an over-extended swayback posture.  INCORRECT STANDING POSTURES  Common incorrect standing postures include a forward head, locked knees and/or an excessive swayback. WALKING Walk with an upright posture. Your ears, shoulders and hips should all line-up. PROLONGED ACTIVITY IN A FLEXED POSITION When completing a task that requires you to bend forward   at your waist or lean over a low surface, try to find a way to stabilize 3 of 4 of your limbs. You can place a hand or elbow on your thigh or rest a knee on the surface you are reaching across. This will provide you more stability so that your muscles do not fatigue as quickly. By keeping your knees relaxed, or slightly bent, you will also reduce stress across your low back. CORRECT LIFTING TECHNIQUES DO :   Assume a wide stance. This will provide you more stability and the opportunity to get as close as possible to the object which you are lifting.  Tense your abdominals to brace your spine; then bend at the knees and hips. Keeping your back locked in a neutral-spine position, lift using your leg muscles. Lift with your legs, keeping your back straight.  Test the weight of unknown objects before attempting to lift them.  Try to keep your elbows locked down at your sides in order get the best strength from your shoulders when carrying an object.  Always ask for help when lifting heavy or awkward objects. INCORRECT LIFTING TECHNIQUES DO NOT:   Lock your knees when lifting, even if it is a small object.  Bend and twist. Pivot at your feet or move your feet when needing to change directions.  Assume that you cannot safely pick up a paperclip without proper posture. Document Released: 12/30/2004 Document Revised: 05/16/2013 Document Reviewed: 04/13/2008 [employer] Patient Information 2015 [employer]. This information is not intended to  replace advice given to you by your health care provider. Make sure you discuss any questions you have with your health care provider.  
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
## 2014-10-18 ENCOUNTER — Other Ambulatory Visit: Payer: BLUE CROSS/BLUE SHIELD

## 2014-11-09 ENCOUNTER — Encounter: Payer: Self-pay | Admitting: *Deleted

## 2014-11-09 ENCOUNTER — Other Ambulatory Visit: Payer: Self-pay | Admitting: Neurology

## 2014-11-09 DIAGNOSIS — G4730 Sleep apnea, unspecified: Principal | ICD-10-CM

## 2014-11-09 DIAGNOSIS — G4733 Obstructive sleep apnea (adult) (pediatric): Secondary | ICD-10-CM

## 2014-11-09 DIAGNOSIS — F901 Attention-deficit hyperactivity disorder, predominantly hyperactive type: Secondary | ICD-10-CM

## 2014-11-09 DIAGNOSIS — Z9989 Dependence on other enabling machines and devices: Secondary | ICD-10-CM

## 2014-11-09 DIAGNOSIS — G4710 Hypersomnia, unspecified: Secondary | ICD-10-CM

## 2014-11-09 MED ORDER — METHYLPHENIDATE HCL 10 MG PO TABS: 10.0000 mg | ORAL_TABLET | Freq: Two times a day (BID) | ORAL | Status: DC

## 2014-11-09 NOTE — Progress Notes (Signed)
Ritalin rx. up front [facility] 

## 2014-11-09 NOTE — Telephone Encounter (Signed)
Request entered, forwarded to provider for approval.  

## 2014-11-09 NOTE — Telephone Encounter (Signed)
Patient is calling to get a written Rx for methylphenidate (RITALIN) 10 MG tablet. I advised the Rx will be ready in 24 hours unless otherwise advised by the nurse.  Thank you. °

## 2014-11-15 ENCOUNTER — Ambulatory Visit (INDEPENDENT_AMBULATORY_CARE_PROVIDER_SITE_OTHER): Payer: BLUE CROSS/BLUE SHIELD

## 2014-11-15 DIAGNOSIS — M5442 Lumbago with sciatica, left side: Secondary | ICD-10-CM

## 2014-11-15 MED ORDER — GADOPENTETATE DIMEGLUMINE 469.01 MG/ML IV SOLN: 20.0000 mL | Freq: Once | INTRAVENOUS | Status: DC | PRN

## 2014-11-21 ENCOUNTER — Telehealth: Payer: Self-pay

## 2014-11-21 ENCOUNTER — Telehealth: Payer: Self-pay | Admitting: *Deleted

## 2014-11-21 DIAGNOSIS — M48061 Spinal stenosis, lumbar region without neurogenic claudication: Secondary | ICD-10-CM

## 2014-11-21 NOTE — Telephone Encounter (Signed)
-----   Message from Melvyn Novasarmen Dohmeier, MD sent at 11/20/2014  5:15 PM EST ----- Severe spinal stenosis.  L 3-4 and L4-5 - recommend a neurosurgical evaluation. Please ask patient if he already has a neurosurgeon that he would like to see CD   coincidentally : Cyst in the right kidney .

## 2014-11-21 NOTE — Telephone Encounter (Signed)
Called [facility] they will make a copy of Mri Disc will  send over to [facility] for patient,I called patient 11/21/14.

## 2014-11-21 NOTE — Telephone Encounter (Signed)
Spoke to pt and advised him of the severe spinal stenosis, and that Dr. Vickey Hugerohmeier recommends a neurosurgical evaluation. He would like a neurosurgery referral.  Pt was advised that there is a cyst in his right kidney seen on MRI. He asked that I fax these results to Dr. Laurann Montanaynthia White to discuss further with her.  Pt also requesting "pictures" from his MRI. I advised him that I would have our MR department give him a call regarding that. [facility]

## 2014-11-21 NOTE — Telephone Encounter (Signed)
Advised pt that the MRI results were faxed to Dr. Cliffton AstersWhite. Pt is requesting Dr. Vickey Hugerohmeier to give him a call regarding MRI results. He has more questions.

## 2014-11-22 NOTE — Telephone Encounter (Signed)
Patient reports back problems since age 49, he will see a neurosurgeon and asked for a disc of the images.  The renal cyst is not a worrisome finding. CD

## 2014-12-14 ENCOUNTER — Telehealth: Payer: Self-pay

## 2014-12-14 ENCOUNTER — Other Ambulatory Visit: Payer: Self-pay | Admitting: Neurology

## 2014-12-14 DIAGNOSIS — G4710 Hypersomnia, unspecified: Secondary | ICD-10-CM

## 2014-12-14 DIAGNOSIS — F901 Attention-deficit hyperactivity disorder, predominantly hyperactive type: Secondary | ICD-10-CM

## 2014-12-14 DIAGNOSIS — Z9989 Dependence on other enabling machines and devices: Secondary | ICD-10-CM

## 2014-12-14 DIAGNOSIS — G4733 Obstructive sleep apnea (adult) (pediatric): Secondary | ICD-10-CM

## 2014-12-14 DIAGNOSIS — G4730 Sleep apnea, unspecified: Principal | ICD-10-CM

## 2014-12-14 MED ORDER — METHYLPHENIDATE HCL 10 MG PO TABS: 10.0000 mg | ORAL_TABLET | Freq: Two times a day (BID) | ORAL | Status: DC

## 2014-12-14 NOTE — Telephone Encounter (Signed)
Request entered, forwarded to provider for approval.  

## 2014-12-14 NOTE — Telephone Encounter (Signed)
I spoke to pt and advised him that his RX was ready for pick up at the front desk. Pt verbalized understanding.

## 2014-12-14 NOTE — Telephone Encounter (Signed)
Patient called to request refill of methylphenidate (RITALIN) 10 MG tablet °

## 2015-01-17 ENCOUNTER — Telehealth: Payer: Self-pay | Admitting: Neurology

## 2015-01-17 NOTE — Telephone Encounter (Signed)
I called back and spoke with the patient.  He prefers to wait until Dr Vickey Hugerohmeier is back in the office tomorrow to address his medication questions.

## 2015-01-17 NOTE — Telephone Encounter (Signed)
Pt needs refill on methylphenidate (RITALIN) 10 MG tablet. Pt also called and said that he used to take Welbutrin for seasonal blues. He wants to know if he started to take it again would it be ok with his other medications. He says he was taking 150 mg once a day. He said he has some from an old rx. Please call and advise [phone].

## 2015-01-18 ENCOUNTER — Other Ambulatory Visit: Payer: Self-pay

## 2015-01-18 ENCOUNTER — Telehealth: Payer: Self-pay

## 2015-01-18 DIAGNOSIS — G4730 Sleep apnea, unspecified: Principal | ICD-10-CM

## 2015-01-18 DIAGNOSIS — F901 Attention-deficit hyperactivity disorder, predominantly hyperactive type: Secondary | ICD-10-CM

## 2015-01-18 DIAGNOSIS — Z9989 Dependence on other enabling machines and devices: Secondary | ICD-10-CM

## 2015-01-18 DIAGNOSIS — G4710 Hypersomnia, unspecified: Secondary | ICD-10-CM

## 2015-01-18 DIAGNOSIS — G4733 Obstructive sleep apnea (adult) (pediatric): Secondary | ICD-10-CM

## 2015-01-18 MED ORDER — METHYLPHENIDATE HCL 10 MG PO TABS: 10.0000 mg | ORAL_TABLET | Freq: Two times a day (BID) | ORAL | Status: DC

## 2015-01-18 NOTE — Telephone Encounter (Signed)
I left a voice mail explaining the mood changes can related to dopamine release , and a higher dose may be avoidable by changing to extended release FOCALIN, generic at 20 mg. He was asked to call me back. Reviewed medication list. CD

## 2015-01-18 NOTE — Telephone Encounter (Signed)
Patent states he was previously taking Wellbutrin 150mg  tablets one daily for seasonal blues.  He would like to start taking it again, and would like to know if Dr Vickey Hugerohmeier is agreeable with this.  Also wants to ensure there aren't any contraindications with meds we prescribe.  Indicates he still has some tablets left over from an old Rx.

## 2015-01-18 NOTE — Telephone Encounter (Signed)
Spoke to pt. He also states that his ritalin does not have the effect it used to. He doesn't feel that it keeps him awake any more. He is taking ritalin 10mg  BID. He says that if he forgets his ritalin one day, he feels very depressed.  Pt is asking whether he should increase the ritalin or change to something else? He also still wants Dr. Vickey Hugerohmeier to review his medications and give her opinion on the wellbutrin (see Jessica's note).  Pt was advised his RX for ritalin is ready for pick up at the front desk.

## 2015-01-19 MED ORDER — DEXMETHYLPHENIDATE HCL ER 20 MG PO CP24: 20.0000 mg | ORAL_CAPSULE | Freq: Every day | ORAL | Status: DC

## 2015-01-19 MED ORDER — METHYLPHENIDATE HCL 10 MG PO TABS: 10.0000 mg | ORAL_TABLET | Freq: Two times a day (BID) | ORAL | Status: DC

## 2015-01-19 NOTE — Addendum Note (Signed)
Addended by: Melvyn NovasHMEIER, Zylie Mumaw on: 01/19/2015 12:40 PM   Modules accepted: Orders

## 2015-01-19 NOTE — Telephone Encounter (Signed)
I called patient back to relay providers note.  Got no answer.  Left message.

## 2015-01-19 NOTE — Telephone Encounter (Signed)
Patient would like to up the dosage of methylphenidate (RITALIN) 10 MG tablet and states Dr. Vickey Hugerohmeier mentioned switching to time release medication. Patient will go with whatever Dr. Vickey Hugerohmeier recommends.

## 2015-01-19 NOTE — Telephone Encounter (Signed)
Patient returned Dr. Vickey Hugerohmeier' call

## 2015-01-19 NOTE — Telephone Encounter (Signed)
Patient called back, prefers time release. Order printed. CD

## 2015-02-21 ENCOUNTER — Telehealth: Payer: Self-pay

## 2015-02-21 ENCOUNTER — Other Ambulatory Visit: Payer: Self-pay | Admitting: Neurology

## 2015-02-21 DIAGNOSIS — G4733 Obstructive sleep apnea (adult) (pediatric): Secondary | ICD-10-CM

## 2015-02-21 DIAGNOSIS — F901 Attention-deficit hyperactivity disorder, predominantly hyperactive type: Secondary | ICD-10-CM

## 2015-02-21 DIAGNOSIS — G4710 Hypersomnia, unspecified: Secondary | ICD-10-CM

## 2015-02-21 DIAGNOSIS — G4730 Sleep apnea, unspecified: Secondary | ICD-10-CM

## 2015-02-21 DIAGNOSIS — Z9989 Dependence on other enabling machines and devices: Secondary | ICD-10-CM

## 2015-02-21 MED ORDER — DEXMETHYLPHENIDATE HCL ER 20 MG PO CP24: 20.0000 mg | ORAL_CAPSULE | Freq: Every day | ORAL | Status: DC

## 2015-02-21 MED ORDER — METHYLPHENIDATE HCL 10 MG PO TABS: 10.0000 mg | ORAL_TABLET | Freq: Two times a day (BID) | ORAL | Status: DC

## 2015-02-21 NOTE — Telephone Encounter (Signed)
Pt needs refill on methylphenidate (RITALIN) 10 MG tablet and dexmethylphenidate (FOCALIN XR) 20 MG 24 hr capsule. Thank you

## 2015-02-21 NOTE — Telephone Encounter (Signed)
I called pt's number listed to advise him that his RXs are available for pick up at the front desk. This no longer is pt's number. Please get a correct number for pt if he calls back.  I called pt's wife's number listed on the DPR and asked her to call us back.  When pt calls back, please get a correct phone number and update it, and advised pt that his RXs are available for pick up at the front desk.

## 2015-02-22 NOTE — Telephone Encounter (Signed)
Patient called to check status of RX's, advised RX's are ready for pick up, phone number has been updated.

## 2015-03-26 ENCOUNTER — Other Ambulatory Visit: Payer: Self-pay | Admitting: Neurology

## 2015-03-26 DIAGNOSIS — F901 Attention-deficit hyperactivity disorder, predominantly hyperactive type: Secondary | ICD-10-CM

## 2015-03-26 MED ORDER — DEXMETHYLPHENIDATE HCL ER 20 MG PO CP24: 20.0000 mg | ORAL_CAPSULE | Freq: Every day | ORAL | Status: DC

## 2015-03-26 NOTE — Telephone Encounter (Signed)
Patient called to request refill of dexmethylphenidate (FOCALIN XR) 20 MG 24 hr capsule

## 2015-03-27 NOTE — Telephone Encounter (Signed)
Spoke to pt and advised him that his RX for focalin is ready for pick up at the front desk.

## 2015-04-27 ENCOUNTER — Telehealth: Payer: Self-pay | Admitting: Neurology

## 2015-04-27 DIAGNOSIS — G4733 Obstructive sleep apnea (adult) (pediatric): Secondary | ICD-10-CM

## 2015-04-27 DIAGNOSIS — G4730 Sleep apnea, unspecified: Secondary | ICD-10-CM

## 2015-04-27 DIAGNOSIS — G4710 Hypersomnia, unspecified: Secondary | ICD-10-CM

## 2015-04-27 DIAGNOSIS — Z9989 Dependence on other enabling machines and devices: Secondary | ICD-10-CM

## 2015-04-27 DIAGNOSIS — F901 Attention-deficit hyperactivity disorder, predominantly hyperactive type: Secondary | ICD-10-CM

## 2015-04-27 NOTE — Telephone Encounter (Signed)
Patient is calling to get written Rx's for methylphenidate (RITALIN) 10 MG tablet and dexmethylphenidate (FOCALIN XR) 20 MG 24 hr capsule. I advised the Rx's will be ready in 24 hours (Monday) unless the nurse advises otherwise.

## 2015-04-30 MED ORDER — METHYLPHENIDATE HCL 10 MG PO TABS: 10.0000 mg | ORAL_TABLET | Freq: Two times a day (BID) | ORAL | Status: DC

## 2015-04-30 MED ORDER — DEXMETHYLPHENIDATE HCL ER 20 MG PO CP24: 20.0000 mg | ORAL_CAPSULE | Freq: Every day | ORAL | Status: DC

## 2015-04-30 NOTE — Telephone Encounter (Signed)
I called pt to advise him that his ritalin and focalin RXs are available for pick up at the front desk. No answer, left a message asking him to call me back. If pt calls back, please advise pt of this information and of clinic hours.

## 2015-04-30 NOTE — Telephone Encounter (Signed)
Pt returned Kristen's call. Clinic hrs relayed to pt. York SpanielSaid he will come today to pick up RX's.

## 2015-06-08 ENCOUNTER — Telehealth: Payer: Self-pay | Admitting: Neurology

## 2015-06-08 DIAGNOSIS — G4733 Obstructive sleep apnea (adult) (pediatric): Secondary | ICD-10-CM

## 2015-06-08 DIAGNOSIS — G4710 Hypersomnia, unspecified: Secondary | ICD-10-CM

## 2015-06-08 DIAGNOSIS — G4730 Sleep apnea, unspecified: Principal | ICD-10-CM

## 2015-06-08 DIAGNOSIS — Z9989 Dependence on other enabling machines and devices: Secondary | ICD-10-CM

## 2015-06-08 DIAGNOSIS — F901 Attention-deficit hyperactivity disorder, predominantly hyperactive type: Secondary | ICD-10-CM

## 2015-06-08 MED ORDER — METHYLPHENIDATE HCL 10 MG PO TABS: 10.0000 mg | ORAL_TABLET | Freq: Two times a day (BID) | ORAL | Status: DC

## 2015-06-08 MED ORDER — DEXMETHYLPHENIDATE HCL ER 20 MG PO CP24: 20.0000 mg | ORAL_CAPSULE | Freq: Every day | ORAL | Status: DC

## 2015-06-08 NOTE — Telephone Encounter (Signed)
I will refill the Ritalin and Focalin at this time.

## 2015-06-08 NOTE — Telephone Encounter (Signed)
Patient called to request refill of methylphenidate (RITALIN) 10 MG tablet and dexmethylphenidate (FOCALIN XR) 20 MG 24 hr capsule

## 2015-06-12 NOTE — Telephone Encounter (Signed)
Rx printed, signed, up front for pick-up. 

## 2015-08-02 ENCOUNTER — Telehealth: Payer: Self-pay | Admitting: Neurology

## 2015-08-02 DIAGNOSIS — G4730 Sleep apnea, unspecified: Secondary | ICD-10-CM

## 2015-08-02 DIAGNOSIS — Z9989 Dependence on other enabling machines and devices: Secondary | ICD-10-CM

## 2015-08-02 DIAGNOSIS — G4733 Obstructive sleep apnea (adult) (pediatric): Secondary | ICD-10-CM

## 2015-08-02 DIAGNOSIS — F901 Attention-deficit hyperactivity disorder, predominantly hyperactive type: Secondary | ICD-10-CM

## 2015-08-02 DIAGNOSIS — G4710 Hypersomnia, unspecified: Secondary | ICD-10-CM

## 2015-08-02 MED ORDER — DEXMETHYLPHENIDATE HCL ER 20 MG PO CP24: 20.0000 mg | ORAL_CAPSULE | Freq: Every day | ORAL | Status: DC

## 2015-08-02 MED ORDER — METHYLPHENIDATE HCL 10 MG PO TABS: 10.0000 mg | ORAL_TABLET | Freq: Two times a day (BID) | ORAL | Status: DC

## 2015-08-02 NOTE — Telephone Encounter (Signed)
Pt's RX for focalin and ritalin are ready for pick up at the front desk. No answer, left a message asking pt to call me back. If pt calls back, please advise him of this information and of clinic hours.

## 2015-08-02 NOTE — Telephone Encounter (Signed)
Patient called to request refills of methylphenidate (RITALIN) 10 MG tablet and dexmethylphenidate (FOCALIN XR) 20 MG 24 hr capsule

## 2015-08-03 NOTE — Telephone Encounter (Signed)
Pt called back , I relayed his rx's were ready for pick up. He will be here Monday

## 2015-09-12 ENCOUNTER — Other Ambulatory Visit: Payer: Self-pay | Admitting: Neurology

## 2015-09-12 DIAGNOSIS — G4710 Hypersomnia, unspecified: Secondary | ICD-10-CM

## 2015-09-12 DIAGNOSIS — G4733 Obstructive sleep apnea (adult) (pediatric): Secondary | ICD-10-CM

## 2015-09-12 DIAGNOSIS — Z9989 Dependence on other enabling machines and devices: Secondary | ICD-10-CM

## 2015-09-12 DIAGNOSIS — F901 Attention-deficit hyperactivity disorder, predominantly hyperactive type: Secondary | ICD-10-CM

## 2015-09-12 DIAGNOSIS — G4730 Sleep apnea, unspecified: Principal | ICD-10-CM

## 2015-09-12 MED ORDER — DEXMETHYLPHENIDATE HCL ER 20 MG PO CP24: 20.0000 mg | ORAL_CAPSULE | Freq: Every day | ORAL | 0 refills | Status: DC

## 2015-09-12 MED ORDER — METHYLPHENIDATE HCL 10 MG PO TABS: 10.0000 mg | ORAL_TABLET | Freq: Two times a day (BID) | ORAL | 0 refills | Status: DC

## 2015-09-12 NOTE — Telephone Encounter (Signed)
I called pt's pharmacy and spoke to the pharmacist. They do not accept faxed ritalin or focalin RXs. They would have to be e-scribed. EPIC does not allow these medications to be e-scribed and therefore, pt will have to pick these prescriptions up. I explained this to the pt. Pt verbalized understanding. I advised him that his ritalin and focalin are ready for pick up at the front desk.

## 2015-09-12 NOTE — Telephone Encounter (Signed)
Patient requesting refill of  methylphenidate (RITALIN) 10 MG tablet , dexmethylphenidate (FOCALIN XR) 20 MG 24 hr capsule Pharmacy: pick up [id number] - [place], [place] - [address]  Pt wants to know if he can have this sent to his pharmacy now. He says that his daughter is on the same medication and he does not have to pick hers up anymore. Please call pt back

## 2015-09-27 ENCOUNTER — Ambulatory Visit (INDEPENDENT_AMBULATORY_CARE_PROVIDER_SITE_OTHER): Payer: BLUE CROSS/BLUE SHIELD | Admitting: Neurology

## 2015-09-27 ENCOUNTER — Encounter: Payer: Self-pay | Admitting: Neurology

## 2015-09-27 DIAGNOSIS — G4710 Hypersomnia, unspecified: Secondary | ICD-10-CM | POA: Diagnosis not present

## 2015-09-27 DIAGNOSIS — G4733 Obstructive sleep apnea (adult) (pediatric): Secondary | ICD-10-CM

## 2015-09-27 DIAGNOSIS — F901 Attention-deficit hyperactivity disorder, predominantly hyperactive type: Secondary | ICD-10-CM | POA: Diagnosis not present

## 2015-09-27 DIAGNOSIS — G4730 Sleep apnea, unspecified: Secondary | ICD-10-CM

## 2015-09-27 DIAGNOSIS — Z9989 Dependence on other enabling machines and devices: Secondary | ICD-10-CM

## 2015-09-27 MED ORDER — DEXMETHYLPHENIDATE HCL ER 20 MG PO CP24: 20.0000 mg | ORAL_CAPSULE | Freq: Every day | ORAL | 0 refills | Status: DC

## 2015-09-27 MED ORDER — METHYLPHENIDATE HCL 10 MG PO TABS: 10.0000 mg | ORAL_TABLET | Freq: Two times a day (BID) | ORAL | 0 refills | Status: DC
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
## 2015-12-27 ENCOUNTER — Telehealth: Payer: Self-pay | Admitting: Neurology

## 2015-12-27 DIAGNOSIS — G4730 Sleep apnea, unspecified: Secondary | ICD-10-CM

## 2015-12-27 DIAGNOSIS — F901 Attention-deficit hyperactivity disorder, predominantly hyperactive type: Secondary | ICD-10-CM

## 2015-12-27 DIAGNOSIS — G4733 Obstructive sleep apnea (adult) (pediatric): Secondary | ICD-10-CM

## 2015-12-27 DIAGNOSIS — Z9989 Dependence on other enabling machines and devices: Secondary | ICD-10-CM

## 2015-12-27 DIAGNOSIS — G4710 Hypersomnia, unspecified: Secondary | ICD-10-CM

## 2015-12-27 NOTE — Telephone Encounter (Signed)
Dr. Vickey Hugerohmeier patient. Both Rx due, and pt up to date on appts. Ok to refill?

## 2015-12-27 NOTE — Telephone Encounter (Signed)
Pt request refill fo r methylphenidate (RITALIN) 10 MG tablet and dexmethylphenidate (FOCALIN XR) 20 MG 24 hr capsule . Pt will pick up RX on Monday

## 2015-12-28 NOTE — Telephone Encounter (Signed)
I'm sorry I did not see this yesterday, if Dr. Vickey Hugerohmeier is not here Monday I will sign it thanks

## 2015-12-31 MED ORDER — DEXMETHYLPHENIDATE HCL ER 20 MG PO CP24: 20.0000 mg | ORAL_CAPSULE | Freq: Every day | ORAL | 0 refills | Status: DC

## 2015-12-31 MED ORDER — METHYLPHENIDATE HCL 10 MG PO TABS: 10.0000 mg | ORAL_TABLET | Freq: Two times a day (BID) | ORAL | 0 refills | Status: DC

## 2015-12-31 NOTE — Telephone Encounter (Signed)
I called patient and he is aware that I have placed Rxs at front desk for p/u

## 2015-12-31 NOTE — Telephone Encounter (Signed)
Rx x2 printed in Dr. Oliva Bustardohmeier's name, waiting for signature

## 2015-12-31 NOTE — Addendum Note (Signed)
Addended by: Crisoforo OxfordURNER, Koula Venier S on: 12/31/2015 08:52 AM   Modules accepted: Orders

## 2016-03-17 ENCOUNTER — Other Ambulatory Visit: Payer: Self-pay | Admitting: Neurology

## 2016-03-17 DIAGNOSIS — F901 Attention-deficit hyperactivity disorder, predominantly hyperactive type: Secondary | ICD-10-CM

## 2016-03-17 DIAGNOSIS — G4733 Obstructive sleep apnea (adult) (pediatric): Secondary | ICD-10-CM

## 2016-03-17 DIAGNOSIS — G4730 Sleep apnea, unspecified: Secondary | ICD-10-CM

## 2016-03-17 DIAGNOSIS — G4710 Hypersomnia, unspecified: Secondary | ICD-10-CM

## 2016-03-17 DIAGNOSIS — Z9989 Dependence on other enabling machines and devices: Secondary | ICD-10-CM

## 2016-03-17 MED ORDER — DEXMETHYLPHENIDATE HCL ER 20 MG PO CP24: 20.0000 mg | ORAL_CAPSULE | Freq: Every day | ORAL | 0 refills | Status: DC

## 2016-03-17 MED ORDER — METHYLPHENIDATE HCL 10 MG PO TABS: 10.0000 mg | ORAL_TABLET | Freq: Two times a day (BID) | ORAL | 0 refills | Status: DC

## 2016-03-17 NOTE — Telephone Encounter (Signed)
I called pt. I advised him that his RXs are available for pick up at the front desk. Pt verbalized understanding.

## 2016-03-17 NOTE — Telephone Encounter (Signed)
Patient called office requesting refill for dexmethylphenidate (FOCALIN XR) 20 MG 24 hr capsule and methylphenidate (RITALIN) 10 MG tablet.

## 2016-03-17 NOTE — Addendum Note (Signed)
Addended by: Geronimo RunningINKINS, Graesyn Schreifels A on: 03/17/2016 11:41 AM   Modules accepted: Orders

## 2016-05-19 ENCOUNTER — Other Ambulatory Visit: Payer: Self-pay | Admitting: Neurology

## 2016-05-19 DIAGNOSIS — G4730 Sleep apnea, unspecified: Secondary | ICD-10-CM

## 2016-05-19 DIAGNOSIS — F901 Attention-deficit hyperactivity disorder, predominantly hyperactive type: Secondary | ICD-10-CM

## 2016-05-19 DIAGNOSIS — Z9989 Dependence on other enabling machines and devices: Secondary | ICD-10-CM

## 2016-05-19 DIAGNOSIS — G4733 Obstructive sleep apnea (adult) (pediatric): Secondary | ICD-10-CM

## 2016-05-19 DIAGNOSIS — G4710 Hypersomnia, unspecified: Secondary | ICD-10-CM

## 2016-05-19 MED ORDER — DEXMETHYLPHENIDATE HCL ER 20 MG PO CP24: 20.0000 mg | ORAL_CAPSULE | Freq: Every day | ORAL | 0 refills | Status: DC

## 2016-05-19 MED ORDER — METHYLPHENIDATE HCL 10 MG PO TABS: 10.0000 mg | ORAL_TABLET | Freq: Two times a day (BID) | ORAL | 0 refills | Status: DC

## 2016-05-19 NOTE — Addendum Note (Signed)
Addended by: Geronimo RunningINKINS, Eylin Pontarelli A on: 05/19/2016 01:21 PM   Modules accepted: Orders

## 2016-05-19 NOTE — Telephone Encounter (Signed)
Patient called office requesting refill for dexmethylphenidate (FOCALIN XR) 20 MG 24 hr capsule and methylphenidate (RITALIN) 10 MG tablet

## 2016-05-19 NOTE — Telephone Encounter (Signed)
I called pt and advised him that his prescriptions are ready for pick up at the front desk and of clinic hours. Pt verbalized understanding of results. Pt had no questions at this time but was encouraged to call back if questions arise.

## 2016-07-15 ENCOUNTER — Other Ambulatory Visit: Payer: Self-pay | Admitting: Neurology

## 2016-07-15 DIAGNOSIS — F901 Attention-deficit hyperactivity disorder, predominantly hyperactive type: Secondary | ICD-10-CM

## 2016-07-15 DIAGNOSIS — G4733 Obstructive sleep apnea (adult) (pediatric): Secondary | ICD-10-CM

## 2016-07-15 DIAGNOSIS — G4730 Sleep apnea, unspecified: Secondary | ICD-10-CM

## 2016-07-15 DIAGNOSIS — G4710 Hypersomnia, unspecified: Secondary | ICD-10-CM

## 2016-07-15 DIAGNOSIS — Z9989 Dependence on other enabling machines and devices: Secondary | ICD-10-CM

## 2016-07-15 NOTE — Telephone Encounter (Signed)
Patient called office requesting refills for methylphenidate (RITALIN) 10 MG tablet AND dexmethylphenidate (FOCALIN XR) 20 MG 24 hr capsule

## 2016-07-17 MED ORDER — METHYLPHENIDATE HCL 10 MG PO TABS: 10.0000 mg | ORAL_TABLET | Freq: Two times a day (BID) | ORAL | 0 refills | Status: DC

## 2016-07-17 MED ORDER — DEXMETHYLPHENIDATE HCL ER 20 MG PO CP24: 20.0000 mg | ORAL_CAPSULE | Freq: Every day | ORAL | 0 refills | Status: DC

## 2016-07-17 NOTE — Addendum Note (Signed)
Addended by: Marlana LatusBRUNO, CASEY C on: 07/17/2016 10:14 AM   Modules accepted: Orders

## 2016-07-17 NOTE — Telephone Encounter (Signed)
Called pt and made him aware that his meds will be ready for pick up at the front desk. Pt verbalized understanding

## 2016-09-11 ENCOUNTER — Other Ambulatory Visit: Payer: Self-pay | Admitting: Neurology

## 2016-09-11 ENCOUNTER — Telehealth: Payer: Self-pay | Admitting: Neurology

## 2016-09-11 DIAGNOSIS — G4733 Obstructive sleep apnea (adult) (pediatric): Secondary | ICD-10-CM

## 2016-09-11 DIAGNOSIS — Z9989 Dependence on other enabling machines and devices: Secondary | ICD-10-CM

## 2016-09-11 DIAGNOSIS — G4710 Hypersomnia, unspecified: Secondary | ICD-10-CM

## 2016-09-11 DIAGNOSIS — G4730 Sleep apnea, unspecified: Principal | ICD-10-CM

## 2016-09-11 DIAGNOSIS — F901 Attention-deficit hyperactivity disorder, predominantly hyperactive type: Secondary | ICD-10-CM

## 2016-09-11 MED ORDER — DEXMETHYLPHENIDATE HCL ER 20 MG PO CP24: 20.0000 mg | ORAL_CAPSULE | Freq: Every day | ORAL | 0 refills | Status: DC

## 2016-09-11 MED ORDER — METHYLPHENIDATE HCL 10 MG PO TABS: 10.0000 mg | ORAL_TABLET | Freq: Two times a day (BID) | ORAL | 0 refills | Status: DC

## 2016-09-11 NOTE — Telephone Encounter (Signed)
Prescriptions will be at front ready for pick up after lunch.

## 2016-09-11 NOTE — Telephone Encounter (Signed)
Patient called in stated he needed to get a refill for his Ritalin 10 mg & Focalin 20 mg.

## 2016-09-22 ENCOUNTER — Ambulatory Visit (INDEPENDENT_AMBULATORY_CARE_PROVIDER_SITE_OTHER): Payer: 59 | Admitting: Neurology

## 2016-09-22 ENCOUNTER — Encounter: Payer: Self-pay | Admitting: Neurology

## 2016-09-22 VITALS — BP 150/93 | HR 66 | Ht 72.0 in | Wt 266.0 lb

## 2016-09-22 DIAGNOSIS — G4733 Obstructive sleep apnea (adult) (pediatric): Secondary | ICD-10-CM | POA: Insufficient documentation

## 2016-09-22 DIAGNOSIS — Z789 Other specified health status: Secondary | ICD-10-CM | POA: Diagnosis not present

## 2016-09-22 DIAGNOSIS — F514 Sleep terrors [night terrors]: Secondary | ICD-10-CM | POA: Diagnosis not present

## 2016-09-22 NOTE — Addendum Note (Signed)
Addended by: Melvyn NovasHMEIER, Kemani Demarais on: 09/22/2016 10:28 AM   Modules accepted: Orders
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
## 2016-10-20 ENCOUNTER — Ambulatory Visit (INDEPENDENT_AMBULATORY_CARE_PROVIDER_SITE_OTHER): Payer: 59 | Admitting: Neurology

## 2016-10-20 DIAGNOSIS — G4733 Obstructive sleep apnea (adult) (pediatric): Secondary | ICD-10-CM | POA: Diagnosis not present

## 2016-10-20 DIAGNOSIS — Z789 Other specified health status: Secondary | ICD-10-CM

## 2016-10-20 DIAGNOSIS — F514 Sleep terrors [night terrors]: Secondary | ICD-10-CM

## 2016-10-20 DIAGNOSIS — M450 Ankylosing spondylitis of multiple sites in spine: Secondary | ICD-10-CM

## 2016-11-04 ENCOUNTER — Other Ambulatory Visit: Payer: Self-pay | Admitting: Neurology

## 2016-11-04 ENCOUNTER — Telehealth: Payer: Self-pay | Admitting: Neurology

## 2016-11-04 DIAGNOSIS — G4733 Obstructive sleep apnea (adult) (pediatric): Secondary | ICD-10-CM

## 2016-11-04 DIAGNOSIS — Z9989 Dependence on other enabling machines and devices: Secondary | ICD-10-CM

## 2016-11-04 DIAGNOSIS — G4730 Sleep apnea, unspecified: Secondary | ICD-10-CM

## 2016-11-04 DIAGNOSIS — G4710 Hypersomnia, unspecified: Secondary | ICD-10-CM

## 2016-11-04 DIAGNOSIS — F901 Attention-deficit hyperactivity disorder, predominantly hyperactive type: Secondary | ICD-10-CM

## 2016-11-04 MED ORDER — DEXMETHYLPHENIDATE HCL ER 20 MG PO CP24: 20.0000 mg | ORAL_CAPSULE | Freq: Every day | ORAL | 0 refills | Status: DC

## 2016-11-04 MED ORDER — METHYLPHENIDATE HCL 10 MG PO TABS: 10.0000 mg | ORAL_TABLET | Freq: Two times a day (BID) | ORAL | 0 refills | Status: DC

## 2016-11-04 NOTE — Telephone Encounter (Signed)
Prescriptions will be ready for pick up.

## 2016-11-04 NOTE — Telephone Encounter (Signed)
Patient requesting refill of  methylphenidate (RITALIN) 10 MG tablet and dexmethylphenidate (FOCALIN XR) 20 MG 24 hr capsule.

## 2016-11-10 ENCOUNTER — Ambulatory Visit: Payer: 59 | Admitting: Nurse Practitioner

## 2016-11-20 NOTE — Procedures (Signed)
[facility] [address] [place], [place] [address] NAME:    [name]                                                         DOB: 08/24/1965 MEDICAL RECORD NUMBER [id number]                                 DOS: 11/16/16 & 11/17/16 REFERRING PHYSICIAN: Harlan Stains, M.D.                           STUDY PERFORMED: Home Sleep Study HISTORY: [name] is a 51 y.o. male Patient, who was seen here for night terrors and apnea in a RV on 09-22-2016, had a previous HST on 10-20-2016. That HST device failed and we repeat now on Watch Pat. This study is performed with and without dental device.  [name] has used CPAP, but not compliantly over the last 9 months. His Epworth Sleepiness Scale however reflects a score of only 4 points and fatigue severity of 11 points and he seems to overall do very well. Current medications are identical to last years he states. He had a baseline AHI of 31.8- but felt not better on CPAP. He uses a mandibular advancement device - but feels jaw pain in AM. He still has a deviated septum- and considers ENT surgery.  [name] has a long history of sleep terrors night terrors, beginning in 1994 and was referred by Dr. Harlan Stains on 06-25-11 for a sleep consultation. At that time he had used Lexapro along with Wellbutrin. He had a sleep study in 2007 which had not seen any apnea or organic sleep disorder at the time. A parasomnia was not captured. 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 He does have a history of HLA-B27 positive blood tests ankylosing spondylitis. STUDY RESULTS:  Study without dental device-             Study with dental device Total Recording:    6 hours, 55 minutes                                 6 hours, 94mns Total Apnea/Hypopnea Index (AHI): 26.0 /hr.                        13.2/hr. Average Oxygen Saturation:            93 %                               95%                                         Lowest Oxygen  Saturation:              85 %  88% Time Oxygen Saturation Below 88%: 1.0   %                           0% Average Heart Rate:                         66 bpm                          66 bpm                                                       ( 46 -97)                            ( 41-94) IMPRESSION: The HST with dental device documented a reduction in AHI by almost 50%, placed the previous moderate-severe apnea into the mild category and allowed for a reduction in hypoxemia time. The patient did not experience tachy-brady arrhythmias in either study, with or without dental device.  RECOMMENDATION: The dental device works - I like for [name] to continue wearing it.   I certify that I have reviewed the raw data recording prior to the issuance of this report in accordance with the standards of the American Academy of Sleep Medicine (AASM). Asencion Partridge Cariah Salatino,M.D.    11-19-2016  Medical Director of [facility] at [facility]; Diplomat of the ABPN and ABSM, and accredited by AASM

## 2016-11-25 ENCOUNTER — Telehealth: Payer: Self-pay | Admitting: Neurology

## 2016-11-25 NOTE — Telephone Encounter (Signed)
Pt has returned the call to RN Casey, he is asking for a call back °

## 2016-11-25 NOTE — Telephone Encounter (Signed)
Called the patient back to make him aware that the sleep study that was completed with the dental device did show that the patient had a 50 % reduction in his apnea. Dr. Brett Fairy is recommending that the patient start using the dental device to help with apnea treatment. The patient stated that he thought this was what he needed to have done to give reason to have the ENT surgery completed. I explained that had the dental device not worked with treating then she would recommend the ENT but since it did help she didn't feel that was necessary. The patient wanted to know if he could still pursue the ENT and have the surgery if this would be covered under insurance I explained that I did not know the answer and that he probably wouldn't know unless he met and had a consultation with the ENT. The patient really was wanting to get something to help with the night terrors. He is having them very frequently and it brings him to tears it causes him so much terror. I explained that she had no answer in regards to that but that I would ask if she feels a cognitive behavior therapist referral would be helpful. I will discuss with Dr Brett Fairy and contact him back with her thoughts.  Pt verbalized understanding. Pt had no questions at this time but was encouraged to call back if questions arise.

## 2016-11-25 NOTE — Telephone Encounter (Signed)
Called patient to discuss sleep study results. No answer at this time. LVM for the patient to call back.   

## 2016-11-25 NOTE — Telephone Encounter (Signed)
-----   Message from Melvyn Novasarmen Dohmeier, MD sent at 11/20/2016  8:39 AM EST ----- The apnea component has been reduced to a mild apnea  By using the dental device- This can not address the parasomnia or night terror component. I would rec. To continue using the dental device as CPAP was not well tolerated. Should the patient undergo septal deviation surgery correction , he may tolerate CPAP and alleviate the apnea completely. CD

## 2016-11-26 ENCOUNTER — Other Ambulatory Visit: Payer: Self-pay | Admitting: Neurology

## 2016-11-26 DIAGNOSIS — J342 Deviated nasal septum: Secondary | ICD-10-CM

## 2016-11-26 DIAGNOSIS — G4733 Obstructive sleep apnea (adult) (pediatric): Secondary | ICD-10-CM

## 2016-11-26 MED ORDER — CLONIDINE HCL 0.1 MG PO TABS: 0.1000 mg | ORAL_TABLET | Freq: Every day | ORAL | 11 refills | Status: DC

## 2016-11-26 NOTE — Telephone Encounter (Signed)
Called the patient and made him aware that we will start a medication to help with the night terrors. I have educated on this and it was called into the pharmacy that he has on file. Pt verbalized understanding. I have also informed him that we will place a referral in for ENT to Dr Annalee GentaShoemaker to help the patient will assessing the deviated setptum and if surgery would be an option for him. Pt verbalized understanding. Pt had no questions at this time but was encouraged to call back if questions arise.

## 2016-11-27 ENCOUNTER — Other Ambulatory Visit: Payer: Self-pay | Admitting: Orthopedic Surgery

## 2016-11-27 DIAGNOSIS — M48062 Spinal stenosis, lumbar region with neurogenic claudication: Secondary | ICD-10-CM

## 2016-11-27 DIAGNOSIS — G9519 Other vascular myelopathies: Secondary | ICD-10-CM

## 2016-12-15 ENCOUNTER — Ambulatory Visit
Admission: RE | Admit: 2016-12-15 | Discharge: 2016-12-15 | Disposition: A | Discharge status: Home / Self Care | Payer: BLUE CROSS/BLUE SHIELD | Source: Ambulatory Visit | Attending: Orthopedic Surgery | Admitting: Orthopedic Surgery

## 2016-12-15 DIAGNOSIS — G9519 Other vascular myelopathies: Secondary | ICD-10-CM

## 2016-12-15 DIAGNOSIS — M48062 Spinal stenosis, lumbar region with neurogenic claudication: Secondary | ICD-10-CM

## 2016-12-19 ENCOUNTER — Other Ambulatory Visit: Payer: Self-pay | Admitting: Orthopedic Surgery

## 2017-01-08 ENCOUNTER — Encounter (HOSPITAL_COMMUNITY): Payer: Self-pay | Admitting: *Deleted

## 2017-01-08 NOTE — Pre-Procedure Instructions (Signed)
[name]  01/08/2017      [id number] - [place], [place] - [address] [place] [id number] Phone: [phone] Fax: [phone]    Your procedure is scheduled on January 14, 2017.  [facility].M.  Call this number if you have problems the morning of surgery:  [phone]   Remember:  Do not eat food or drink liquids after midnight.  Take these medicines the morning of surgery with A SIP OF WATER Levothyroxine.  May use Albuterol inhaler if needed. Bring inhaler with you to the hospital.  7 days prior to surgery STOP taking any Aspirin (unless otherwise instructed by your surgeon), Aleve, Naproxen, Ibuprofen, Motrin, Advil, Goody's, BC's, all herbal medications, fish oil, all vitamins, and Meloxicam (Mobic).   Do not wear jewelry.  Do not wear lotions, powders, or cologne, or deodorant.  Men may shave face and neck.  Do not bring valuables to the hospital.  [facility] is not responsible for any belongings or valuables.  Contacts, dentures or bridgework may not be worn into surgery.  Leave your suitcase in the car.  After surgery it may be brought to your room.  For patients admitted to the hospital, discharge time will be determined by your treatment team.  Patients discharged the day of surgery will not be allowed to drive home.    [facility]- Preparing For Surgery  Before surgery, you can play an important role. Because skin is not sterile, your skin needs to be as free of germs as possible. You can reduce the number of germs on your skin by washing with CHG (chlorahexidine gluconate) Soap before surgery.  CHG is an antiseptic cleaner which kills germs and bonds with the skin to continue killing germs even after washing.  Please do not use if you have an allergy to CHG or antibacterial soaps. If your skin becomes reddened/irritated stop using the CHG.  Do not shave  (including legs and underarms) for at least 48 hours prior to first CHG shower. It is OK to shave your face.  Please follow these instructions carefully.   1. Shower the NIGHT BEFORE SURGERY and the MORNING OF SURGERY with CHG.   2. If you chose to wash your hair, wash your hair first as usual with your normal shampoo.  3. After you shampoo, rinse your hair and body thoroughly to remove the shampoo.  4. Use CHG as you would any other liquid soap. You can apply CHG directly to the skin and wash gently with a scrungie or a clean washcloth.   5. Apply the CHG Soap to your body ONLY FROM THE NECK DOWN.  Do not use on open wounds or open sores. Avoid contact with your eyes, ears, mouth and genitals (private parts). Wash Face and genitals (private parts)  with your normal soap.  6. Wash thoroughly, paying special attention to the area where your surgery will be performed.  7. Thoroughly rinse your body with warm water from the neck down.  8. DO NOT shower/wash with your normal soap after using and rinsing off the CHG Soap.  9. Pat yourself dry with a CLEAN TOWEL.  10. Wear CLEAN PAJAMAS to bed the night before surgery, wear comfortable clothes the morning of surgery  11. Place CLEAN SHEETS on your bed the night of your first shower and DO NOT SLEEP WITH PETS.    Day  of Surgery: Do not apply any deodorants/lotions. Please wear clean clothes to the hospital/surgery center.      Please read over the following fact sheets that you were given. Pain Booklet, Coughing and Deep Breathing, MRSA Information and Surgical Site Infection Prevention

## 2017-01-09 ENCOUNTER — Encounter (HOSPITAL_COMMUNITY)
Admission: RE | Admit: 2017-01-09 | Discharge: 2017-01-09 | Disposition: A | Discharge status: Home / Self Care | Payer: 59 | Source: Ambulatory Visit | Attending: Orthopedic Surgery | Admitting: Orthopedic Surgery

## 2017-01-09 ENCOUNTER — Other Ambulatory Visit: Payer: Self-pay

## 2017-01-09 ENCOUNTER — Encounter (HOSPITAL_COMMUNITY): Payer: Self-pay

## 2017-01-09 ENCOUNTER — Ambulatory Visit (HOSPITAL_COMMUNITY)
Admission: RE | Admit: 2017-01-09 | Discharge: 2017-01-09 | Disposition: A | Discharge status: Home / Self Care | Payer: 59 | Source: Ambulatory Visit | Attending: Orthopedic Surgery | Admitting: Orthopedic Surgery

## 2017-01-09 DIAGNOSIS — Z01812 Encounter for preprocedural laboratory examination: Secondary | ICD-10-CM | POA: Diagnosis present

## 2017-01-09 DIAGNOSIS — Z79899 Other long term (current) drug therapy: Secondary | ICD-10-CM | POA: Diagnosis not present

## 2017-01-09 DIAGNOSIS — J45909 Unspecified asthma, uncomplicated: Secondary | ICD-10-CM | POA: Diagnosis not present

## 2017-01-09 DIAGNOSIS — Z01818 Encounter for other preprocedural examination: Secondary | ICD-10-CM | POA: Insufficient documentation

## 2017-01-09 DIAGNOSIS — E039 Hypothyroidism, unspecified: Secondary | ICD-10-CM | POA: Insufficient documentation

## 2017-01-09 DIAGNOSIS — Z01810 Encounter for preprocedural cardiovascular examination: Secondary | ICD-10-CM | POA: Diagnosis present

## 2017-01-09 DIAGNOSIS — G4733 Obstructive sleep apnea (adult) (pediatric): Secondary | ICD-10-CM | POA: Insufficient documentation

## 2017-01-09 DIAGNOSIS — M459 Ankylosing spondylitis of unspecified sites in spine: Secondary | ICD-10-CM | POA: Diagnosis not present

## 2017-01-09 HISTORY — DX: Ankylosing spondylitis of unspecified sites in spine: M45.9

## 2017-01-09 HISTORY — DX: Major depressive disorder, single episode, unspecified: F32.9

## 2017-01-09 HISTORY — DX: Anxiety disorder, unspecified: F41.9

## 2017-01-09 HISTORY — DX: Unspecified asthma, uncomplicated: J45.909

## 2017-01-09 HISTORY — DX: Depression, unspecified: F32.A

## 2017-01-09 HISTORY — DX: Sleep apnea, unspecified: G47.30

## 2017-01-09 HISTORY — DX: Hypothyroidism, unspecified: E03.9

## 2017-01-09 LAB — URINALYSIS, ROUTINE W REFLEX MICROSCOPIC
Bilirubin Urine: NEGATIVE
GLUCOSE, UA: NEGATIVE mg/dL
KETONES UR: NEGATIVE mg/dL
Leukocytes, UA: NEGATIVE
Nitrite: NEGATIVE
PH: 5 (ref 5.0–8.0)
Protein, ur: NEGATIVE mg/dL
SPECIFIC GRAVITY, URINE: 1.017 (ref 1.005–1.030)
SQUAMOUS EPITHELIAL / LPF: NONE SEEN

## 2017-01-09 LAB — COMPREHENSIVE METABOLIC PANEL
ALK PHOS: 48 U/L (ref 38–126)
ALT: 79 U/L — AB (ref 17–63)
ANION GAP: 9 (ref 5–15)
AST: 46 U/L — ABNORMAL HIGH (ref 15–41)
Albumin: 4.3 g/dL (ref 3.5–5.0)
BILIRUBIN TOTAL: 0.7 mg/dL (ref 0.3–1.2)
BUN: 14 mg/dL (ref 6–20)
CALCIUM: 9.1 mg/dL (ref 8.9–10.3)
CO2: 25 mmol/L (ref 22–32)
CREATININE: 0.94 mg/dL (ref 0.61–1.24)
Chloride: 105 mmol/L (ref 101–111)
Glucose, Bld: 124 mg/dL — ABNORMAL HIGH (ref 65–99)
Potassium: 3.8 mmol/L (ref 3.5–5.1)
SODIUM: 139 mmol/L (ref 135–145)
TOTAL PROTEIN: 7.1 g/dL (ref 6.5–8.1)

## 2017-01-09 LAB — CBC WITH DIFFERENTIAL/PLATELET
Basophils Absolute: 0 10*3/uL (ref 0.0–0.1)
Basophils Relative: 0 %
EOS ABS: 0.2 10*3/uL (ref 0.0–0.7)
Eosinophils Relative: 3 %
HCT: 45.9 % (ref 39.0–52.0)
HEMOGLOBIN: 15.2 g/dL (ref 13.0–17.0)
LYMPHS ABS: 2 10*3/uL (ref 0.7–4.0)
LYMPHS PCT: 36 %
MCH: 27.6 pg (ref 26.0–34.0)
MCHC: 33.1 g/dL (ref 30.0–36.0)
MCV: 83.5 fL (ref 78.0–100.0)
MONOS PCT: 8 %
Monocytes Absolute: 0.4 10*3/uL (ref 0.1–1.0)
NEUTROS PCT: 53 %
Neutro Abs: 2.9 10*3/uL (ref 1.7–7.7)
Platelets: 173 10*3/uL (ref 150–400)
RBC: 5.5 MIL/uL (ref 4.22–5.81)
RDW: 13.3 % (ref 11.5–15.5)
WBC: 5.5 10*3/uL (ref 4.0–10.5)

## 2017-01-09 LAB — SURGICAL PCR SCREEN
MRSA, PCR: NEGATIVE
STAPHYLOCOCCUS AUREUS: NEGATIVE

## 2017-01-09 LAB — PROTIME-INR
INR: 0.93
PROTHROMBIN TIME: 12.3 s (ref 11.4–15.2)

## 2017-01-09 LAB — APTT: aPTT: 27 seconds (ref 24–36)

## 2017-01-12 ENCOUNTER — Encounter (HOSPITAL_COMMUNITY): Payer: Self-pay

## 2017-01-12 MED ORDER — DEXTROSE 5 % IV SOLN
3.0000 g | INTRAVENOUS | Status: DC
  Filled 2017-01-12: qty 3000

## 2017-01-12 NOTE — Progress Notes (Signed)
Anesthesia Chart Review:  Pt is a 51 year old male scheduled for L3-4 decompression on 01/14/2017 with Estill BambergMark Dumonski, MD  - PCP is Samuel Jesterynthia Butler, MD  PMH includes:  OSA, asthma, night terrors, hypothyroidism, ankylosing spondilitis. Never smoker. BMI 36  Medications include: Albuterol, clonidine, Focalin XR, Humira, levothyroxine, methylphenidate   BP (!) 175/77 Comment: informed Boneta LucksJenny, RN  Pulse 84   Temp 36.6 C (Oral)   Resp 18   Ht 6' (1.829 m)   Wt 265 lb 14 oz (120.6 kg)   SpO2 96%   BMI 36.06 kg/m   Preoperative labs reviewed.   - AST 46, ALT 79  CXR 01/09/17: No active cardiopulmonary disease.  EKG 01/09/17: NSR  Pt does not have hx HTN, but BP elevated at pre-admission testing.  Will route this note to PCP for follow up.   If no changes, I anticipate pt can proceed with surgery as scheduled.   Rica Mastngela Kabbe, FNP-BC [facility] Phone: [phone] 01/12/2017 10:31 AM

## 2017-01-13 ENCOUNTER — Encounter (HOSPITAL_COMMUNITY): Payer: Self-pay | Admitting: Anesthesiology

## 2017-01-13 NOTE — Anesthesia Preprocedure Evaluation (Addendum)
Anesthesia Evaluation  Patient identified by MRN, date of birth, ID band Patient awake    Reviewed: Allergy & Precautions, NPO status , Patient's Chart, lab work & pertinent test results  Airway Mallampati: II  TM Distance: >3 FB Neck ROM: Full    Dental  (+) Teeth Intact, Dental Advisory Given   Pulmonary asthma , sleep apnea ,    breath sounds clear to auscultation       Cardiovascular negative cardio ROS   Rhythm:Regular Rate:Normal     Neuro/Psych  Headaches, PSYCHIATRIC DISORDERS Anxiety Depression  Neuromuscular disease    GI/Hepatic negative GI ROS, Neg liver ROS,   Endo/Other  diabetes, Type 2, Insulin DependentHypothyroidism   Renal/GU negative Renal ROS  negative genitourinary   Musculoskeletal negative musculoskeletal ROS (+)   Abdominal (+) + obese,   Peds  Hematology negative hematology ROS (+)   Anesthesia Other Findings   Reproductive/Obstetrics                            Lab Results  Component Value Date   WBC 5.5 01/09/2017   HGB 15.2 01/09/2017   HCT 45.9 01/09/2017   MCV 83.5 01/09/2017   PLT 173 01/09/2017   Lab Results  Component Value Date   CREATININE 0.94 01/09/2017   BUN 14 01/09/2017   NA 139 01/09/2017   K 3.8 01/09/2017   CL 105 01/09/2017   CO2 25 01/09/2017   Lab Results  Component Value Date   INR 0.93 01/09/2017   INR 1.10 09/18/2014   EKG: NSR   Anesthesia Physical Anesthesia Plan  ASA: II  Anesthesia Plan: General   Post-op Pain Management:    Induction: Intravenous  PONV Risk Score and Plan: 3 and Ondansetron, Dexamethasone and Midazolam  Airway Management Planned: Oral ETT  Additional Equipment: None  Intra-op Plan:   Post-operative Plan: Extubation in OR  Informed Consent: I have reviewed the patients History and Physical, chart, labs and discussed the procedure including the risks, benefits and alternatives for the  proposed anesthesia with the patient or authorized representative who has indicated his/her understanding and acceptance.   Dental advisory given  Plan Discussed with: CRNA  Anesthesia Plan Comments:         Anesthesia Quick Evaluation

## 2017-01-14 ENCOUNTER — Encounter (HOSPITAL_COMMUNITY)
Admission: RE | Disposition: A | Discharge status: Home / Self Care | Payer: Self-pay | Source: Ambulatory Visit | Attending: Orthopedic Surgery

## 2017-01-14 ENCOUNTER — Ambulatory Visit (HOSPITAL_COMMUNITY): Payer: 59 | Admitting: Emergency Medicine

## 2017-01-14 ENCOUNTER — Ambulatory Visit (HOSPITAL_COMMUNITY): Payer: 59 | Admitting: Anesthesiology

## 2017-01-14 ENCOUNTER — Encounter (HOSPITAL_COMMUNITY): Payer: Self-pay | Admitting: *Deleted

## 2017-01-14 ENCOUNTER — Ambulatory Visit (HOSPITAL_COMMUNITY): Payer: 59

## 2017-01-14 ENCOUNTER — Ambulatory Visit (HOSPITAL_COMMUNITY)
Admission: RE | Admit: 2017-01-14 | Discharge: 2017-01-14 | Disposition: A | Discharge status: Home / Self Care | Payer: 59 | Source: Ambulatory Visit | Attending: Orthopedic Surgery | Admitting: Orthopedic Surgery

## 2017-01-14 ENCOUNTER — Other Ambulatory Visit: Payer: Self-pay

## 2017-01-14 DIAGNOSIS — J45909 Unspecified asthma, uncomplicated: Secondary | ICD-10-CM | POA: Insufficient documentation

## 2017-01-14 DIAGNOSIS — M48062 Spinal stenosis, lumbar region with neurogenic claudication: Secondary | ICD-10-CM | POA: Insufficient documentation

## 2017-01-14 DIAGNOSIS — M5126 Other intervertebral disc displacement, lumbar region: Secondary | ICD-10-CM | POA: Insufficient documentation

## 2017-01-14 DIAGNOSIS — F329 Major depressive disorder, single episode, unspecified: Secondary | ICD-10-CM | POA: Diagnosis not present

## 2017-01-14 DIAGNOSIS — F419 Anxiety disorder, unspecified: Secondary | ICD-10-CM | POA: Diagnosis not present

## 2017-01-14 DIAGNOSIS — Z79899 Other long term (current) drug therapy: Secondary | ICD-10-CM | POA: Insufficient documentation

## 2017-01-14 DIAGNOSIS — E039 Hypothyroidism, unspecified: Secondary | ICD-10-CM | POA: Insufficient documentation

## 2017-01-14 DIAGNOSIS — Z419 Encounter for procedure for purposes other than remedying health state, unspecified: Secondary | ICD-10-CM

## 2017-01-14 DIAGNOSIS — G4730 Sleep apnea, unspecified: Secondary | ICD-10-CM | POA: Insufficient documentation

## 2017-01-14 HISTORY — PX: LUMBAR LAMINECTOMY/DECOMPRESSION MICRODISCECTOMY: SHX5026

## 2017-01-14 SURGERY — LUMBAR LAMINECTOMY/DECOMPRESSION MICRODISCECTOMY
Anesthesia: General

## 2017-01-14 MED ORDER — MIDAZOLAM HCL 2 MG/2ML IJ SOLN
INTRAMUSCULAR | Status: AC
  Filled 2017-01-14: qty 2

## 2017-01-14 MED ORDER — BUPIVACAINE LIPOSOME 1.3 % IJ SUSP
20.0000 mL | INTRAMUSCULAR | Status: DC
  Filled 2017-01-14: qty 20

## 2017-01-14 MED ORDER — HYDROMORPHONE HCL 1 MG/ML IJ SOLN
0.2500 mg | INTRAMUSCULAR | Status: DC | PRN
  Administered 2017-01-14 (×4): 0.5 mg via INTRAVENOUS

## 2017-01-14 MED ORDER — PROMETHAZINE HCL 25 MG/ML IJ SOLN: 6.2500 mg | INTRAMUSCULAR | Status: DC | PRN

## 2017-01-14 MED ORDER — DEXMEDETOMIDINE HCL 200 MCG/2ML IV SOLN
INTRAVENOUS | Status: DC | PRN
  Administered 2017-01-14: 4 ug via INTRAVENOUS
  Administered 2017-01-14: 8 ug via INTRAVENOUS
  Administered 2017-01-14 (×7): 4 ug via INTRAVENOUS

## 2017-01-14 MED ORDER — SODIUM CHLORIDE 0.9 % IV SOLN
INTRAVENOUS | Status: DC | PRN
  Administered 2017-01-14: 500 mL

## 2017-01-14 MED ORDER — DEXAMETHASONE SODIUM PHOSPHATE 10 MG/ML IJ SOLN
INTRAMUSCULAR | Status: AC
  Filled 2017-01-14: qty 1

## 2017-01-14 MED ORDER — THROMBIN (RECOMBINANT) 5000 UNITS EX SOLR
CUTANEOUS | Status: DC | PRN
  Administered 2017-01-14: 5000 [IU] via TOPICAL
  Administered 2017-01-14: 10000 [IU] via TOPICAL
  Administered 2017-01-14: 5000 [IU] via TOPICAL

## 2017-01-14 MED ORDER — FENTANYL CITRATE (PF) 250 MCG/5ML IJ SOLN
INTRAMUSCULAR | Status: DC | PRN
  Administered 2017-01-14: 100 ug via INTRAVENOUS
  Administered 2017-01-14 (×3): 50 ug via INTRAVENOUS
  Administered 2017-01-14: 100 ug via INTRAVENOUS
  Administered 2017-01-14: 50 ug via INTRAVENOUS

## 2017-01-14 MED ORDER — CEFAZOLIN SODIUM-DEXTROSE 2-4 GM/100ML-% IV SOLN
INTRAVENOUS | Status: AC
  Filled 2017-01-14: qty 100

## 2017-01-14 MED ORDER — THROMBIN (RECOMBINANT) 5000 UNITS EX SOLR
CUTANEOUS | Status: AC
  Filled 2017-01-14: qty 5000

## 2017-01-14 MED ORDER — METHYLPREDNISOLONE ACETATE 40 MG/ML IJ SUSP
INTRAMUSCULAR | Status: AC
  Filled 2017-01-14: qty 1

## 2017-01-14 MED ORDER — CEFAZOLIN SODIUM-DEXTROSE 2-3 GM-%(50ML) IV SOLR
INTRAVENOUS | Status: DC | PRN
  Administered 2017-01-14: 2 g via INTRAVENOUS

## 2017-01-14 MED ORDER — 0.9 % SODIUM CHLORIDE (POUR BTL) OPTIME
TOPICAL | Status: DC | PRN
  Administered 2017-01-14 (×3): 1000 mL

## 2017-01-14 MED ORDER — THROMBIN (RECOMBINANT) 5000 UNITS EX SOLR
CUTANEOUS | Status: AC
  Filled 2017-01-14: qty 10000

## 2017-01-14 MED ORDER — BUPIVACAINE-EPINEPHRINE 0.25% -1:200000 IJ SOLN
INTRAMUSCULAR | Status: DC | PRN
  Administered 2017-01-14: 30 mL

## 2017-01-14 MED ORDER — DEXTROSE 5 % IV SOLN
INTRAVENOUS | Status: DC | PRN
  Administered 2017-01-14: 25 ug/min via INTRAVENOUS

## 2017-01-14 MED ORDER — ARTIFICIAL TEARS OPHTHALMIC OINT
TOPICAL_OINTMENT | OPHTHALMIC | Status: DC | PRN
  Administered 2017-01-14: 1 via OPHTHALMIC

## 2017-01-14 MED ORDER — PHENYLEPHRINE HCL 10 MG/ML IJ SOLN
INTRAMUSCULAR | Status: DC | PRN
  Administered 2017-01-14: 40 ug via INTRAVENOUS
  Administered 2017-01-14 (×2): 80 ug via INTRAVENOUS

## 2017-01-14 MED ORDER — BUPIVACAINE-EPINEPHRINE (PF) 0.25% -1:200000 IJ SOLN
INTRAMUSCULAR | Status: AC
  Filled 2017-01-14: qty 30

## 2017-01-14 MED ORDER — POVIDONE-IODINE 7.5 % EX SOLN
Freq: Once | CUTANEOUS | Status: DC
  Filled 2017-01-14: qty 118

## 2017-01-14 MED ORDER — ONDANSETRON HCL 4 MG/2ML IJ SOLN
INTRAMUSCULAR | Status: AC
  Filled 2017-01-14: qty 2

## 2017-01-14 MED ORDER — DEXAMETHASONE SODIUM PHOSPHATE 10 MG/ML IJ SOLN
INTRAMUSCULAR | Status: DC | PRN
  Administered 2017-01-14: 4 mg via INTRAVENOUS

## 2017-01-14 MED ORDER — METHYLENE BLUE 0.5 % INJ SOLN
INTRAVENOUS | Status: DC | PRN
  Administered 2017-01-14: .3 mL

## 2017-01-14 MED ORDER — HYDROMORPHONE HCL 1 MG/ML IJ SOLN
INTRAMUSCULAR | Status: AC
  Administered 2017-01-14: 0.5 mg via INTRAVENOUS
  Filled 2017-01-14: qty 1

## 2017-01-14 MED ORDER — MEPERIDINE HCL 25 MG/ML IJ SOLN: 6.2500 mg | INTRAMUSCULAR | Status: DC | PRN

## 2017-01-14 MED ORDER — BUPIVACAINE LIPOSOME 1.3 % IJ SUSP
INTRAMUSCULAR | Status: DC | PRN
  Administered 2017-01-14: 20 mL

## 2017-01-14 MED ORDER — LACTATED RINGERS IV SOLN
INTRAVENOUS | Status: DC | PRN
  Administered 2017-01-14 (×2): via INTRAVENOUS

## 2017-01-14 MED ORDER — LIDOCAINE 2% (20 MG/ML) 5 ML SYRINGE
INTRAMUSCULAR | Status: DC | PRN
  Administered 2017-01-14: 100 mg via INTRAVENOUS

## 2017-01-14 MED ORDER — FENTANYL CITRATE (PF) 250 MCG/5ML IJ SOLN
INTRAMUSCULAR | Status: AC
  Filled 2017-01-14: qty 5

## 2017-01-14 MED ORDER — MIDAZOLAM HCL 2 MG/2ML IJ SOLN
INTRAMUSCULAR | Status: DC | PRN
  Administered 2017-01-14: 2 mg via INTRAVENOUS

## 2017-01-14 MED ORDER — PROPOFOL 10 MG/ML IV BOLUS
INTRAVENOUS | Status: AC
  Filled 2017-01-14: qty 20

## 2017-01-14 MED ORDER — LACTATED RINGERS IV SOLN: INTRAVENOUS | Status: DC

## 2017-01-14 MED ORDER — ROCURONIUM BROMIDE 10 MG/ML (PF) SYRINGE
PREFILLED_SYRINGE | INTRAVENOUS | Status: DC | PRN
  Administered 2017-01-14: 20 mg via INTRAVENOUS
  Administered 2017-01-14: 10 mg via INTRAVENOUS
  Administered 2017-01-14: 40 mg via INTRAVENOUS
  Administered 2017-01-14: 30 mg via INTRAVENOUS
  Administered 2017-01-14: 50 mg via INTRAVENOUS
  Administered 2017-01-14: 10 mg via INTRAVENOUS

## 2017-01-14 MED ORDER — ONDANSETRON HCL 4 MG/2ML IJ SOLN
INTRAMUSCULAR | Status: DC | PRN
  Administered 2017-01-14: 4 mg via INTRAVENOUS

## 2017-01-14 MED ORDER — SUGAMMADEX SODIUM 200 MG/2ML IV SOLN
INTRAVENOUS | Status: DC | PRN
  Administered 2017-01-14: 400 mg via INTRAVENOUS

## 2017-01-14 MED ORDER — PROPOFOL 10 MG/ML IV BOLUS
INTRAVENOUS | Status: DC | PRN
  Administered 2017-01-14: 200 mg via INTRAVENOUS

## 2017-01-14 MED ORDER — METHYLPREDNISOLONE ACETATE 40 MG/ML IJ SUSP
INTRAMUSCULAR | Status: DC | PRN
  Administered 2017-01-14: 40 mg

## 2017-01-14 MED ORDER — SUGAMMADEX SODIUM 500 MG/5ML IV SOLN
INTRAVENOUS | Status: AC
  Filled 2017-01-14: qty 5

## 2017-01-14 MED ORDER — ROCURONIUM BROMIDE 10 MG/ML (PF) SYRINGE
PREFILLED_SYRINGE | INTRAVENOUS | Status: AC
  Filled 2017-01-14: qty 10

## 2017-01-14 MED ORDER — METHYLENE BLUE 0.5 % INJ SOLN
INTRAVENOUS | Status: AC
  Filled 2017-01-14: qty 10

## 2017-01-14 MED ORDER — HYDROMORPHONE HCL 1 MG/ML IJ SOLN
INTRAMUSCULAR | Status: AC
  Filled 2017-01-14: qty 1

## 2017-01-14 SURGICAL SUPPLY — 73 items
BENZOIN TINCTURE PRP APPL 2/3 (GAUZE/BANDAGES/DRESSINGS) ×3 IMPLANT
BUR ROUND FLUTED 5 RND (BURR) ×2 IMPLANT
BUR ROUND FLUTED 5MM RND (BURR) ×1
BUR ROUND PRECISION 4.0 (BURR) IMPLANT
BUR ROUND PRECISION 4.0MM (BURR)
CANISTER SUCT 3000ML PPV (MISCELLANEOUS) ×3 IMPLANT
CARTRIDGE OIL MAESTRO DRILL (MISCELLANEOUS) ×1 IMPLANT
CLOSURE WOUND 1/2 X4 (GAUZE/BANDAGES/DRESSINGS) ×1
CORDS BIPOLAR (ELECTRODE) ×3 IMPLANT
COVER SURGICAL LIGHT HANDLE (MISCELLANEOUS) ×3 IMPLANT
DIFFUSER DRILL AIR PNEUMATIC (MISCELLANEOUS) ×3 IMPLANT
DRAIN CHANNEL 15F RND FF W/TCR (WOUND CARE) IMPLANT
DRAPE POUCH INSTRU U-SHP 10X18 (DRAPES) ×3 IMPLANT
DRAPE SURG 17X23 STRL (DRAPES) ×12 IMPLANT
DURAPREP 26ML APPLICATOR (WOUND CARE) ×3 IMPLANT
ELECT BLADE 4.0 EZ CLEAN MEGAD (MISCELLANEOUS) ×3
ELECT CAUTERY BLADE 6.4 (BLADE) ×3 IMPLANT
ELECT REM PT RETURN 9FT ADLT (ELECTROSURGICAL) ×3
ELECTRODE BLDE 4.0 EZ CLN MEGD (MISCELLANEOUS) ×1 IMPLANT
ELECTRODE REM PT RTRN 9FT ADLT (ELECTROSURGICAL) ×1 IMPLANT
EVACUATOR SILICONE 100CC (DRAIN) IMPLANT
FILTER STRAW FLUID ASPIR (MISCELLANEOUS) ×3 IMPLANT
GAUZE SPONGE 4X4 12PLY STRL (GAUZE/BANDAGES/DRESSINGS) ×3 IMPLANT
GAUZE SPONGE 4X4 16PLY XRAY LF (GAUZE/BANDAGES/DRESSINGS) ×6 IMPLANT
GLOVE BIO SURGEON STRL SZ7 (GLOVE) ×3 IMPLANT
GLOVE BIO SURGEON STRL SZ8 (GLOVE) ×6 IMPLANT
GLOVE BIOGEL PI IND STRL 7.0 (GLOVE) ×1 IMPLANT
GLOVE BIOGEL PI IND STRL 8 (GLOVE) ×2 IMPLANT
GLOVE BIOGEL PI INDICATOR 7.0 (GLOVE) ×2
GLOVE BIOGEL PI INDICATOR 8 (GLOVE) ×4
GOWN STRL REUS W/ TWL LRG LVL3 (GOWN DISPOSABLE) ×1 IMPLANT
GOWN STRL REUS W/ TWL XL LVL3 (GOWN DISPOSABLE) ×2 IMPLANT
GOWN STRL REUS W/TWL LRG LVL3 (GOWN DISPOSABLE) ×2
GOWN STRL REUS W/TWL XL LVL3 (GOWN DISPOSABLE) ×4
IV CATH 14GX2 1/4 (CATHETERS) ×3 IMPLANT
KIT BASIN OR (CUSTOM PROCEDURE TRAY) ×3 IMPLANT
KIT POSITION SURG JACKSON T1 (MISCELLANEOUS) ×3 IMPLANT
KIT ROOM TURNOVER OR (KITS) ×3 IMPLANT
NEEDLE 18GX1X1/2 (RX/OR ONLY) (NEEDLE) ×3 IMPLANT
NEEDLE 22X1 1/2 (OR ONLY) (NEEDLE) ×3 IMPLANT
NEEDLE HYPO 25GX1X1/2 BEV (NEEDLE) ×3 IMPLANT
NEEDLE SPNL 18GX3.5 QUINCKE PK (NEEDLE) ×6 IMPLANT
NS IRRIG 1000ML POUR BTL (IV SOLUTION) ×3 IMPLANT
OIL CARTRIDGE MAESTRO DRILL (MISCELLANEOUS) ×3
PACK LAMINECTOMY ORTHO (CUSTOM PROCEDURE TRAY) ×3 IMPLANT
PACK UNIVERSAL I (CUSTOM PROCEDURE TRAY) ×3 IMPLANT
PAD ARMBOARD 7.5X6 YLW CONV (MISCELLANEOUS) ×12 IMPLANT
PATTIES SURGICAL .5 X.5 (GAUZE/BANDAGES/DRESSINGS) IMPLANT
PATTIES SURGICAL .5 X1 (DISPOSABLE) ×3 IMPLANT
SPOGE SURGIFLO 8M (HEMOSTASIS) ×2
SPONGE INTESTINAL PEANUT (DISPOSABLE) ×3 IMPLANT
SPONGE SURGIFLO 8M (HEMOSTASIS) ×1 IMPLANT
SPONGE SURGIFOAM ABS GEL 100 (HEMOSTASIS) ×3 IMPLANT
SPONGE SURGIFOAM ABS GEL SZ50 (HEMOSTASIS) ×3 IMPLANT
STRIP CLOSURE SKIN 1/2X4 (GAUZE/BANDAGES/DRESSINGS) ×2 IMPLANT
SURGIFLO W/THROMBIN 8M KIT (HEMOSTASIS) IMPLANT
SUT MNCRL AB 4-0 PS2 18 (SUTURE) ×3 IMPLANT
SUT VIC AB 0 CT1 18XCR BRD 8 (SUTURE) IMPLANT
SUT VIC AB 0 CT1 27 (SUTURE)
SUT VIC AB 0 CT1 27XBRD ANBCTR (SUTURE) IMPLANT
SUT VIC AB 0 CT1 8-18 (SUTURE)
SUT VIC AB 1 CT1 18XCR BRD 8 (SUTURE) ×2 IMPLANT
SUT VIC AB 1 CT1 8-18 (SUTURE) ×4
SUT VIC AB 2-0 CT2 18 VCP726D (SUTURE) ×6 IMPLANT
SYR 20CC LL (SYRINGE) ×3 IMPLANT
SYR BULB IRRIGATION 50ML (SYRINGE) ×3 IMPLANT
SYR CONTROL 10ML LL (SYRINGE) ×6 IMPLANT
SYR TB 1ML 26GX3/8 SAFETY (SYRINGE) ×6 IMPLANT
SYR TB 1ML LUER SLIP (SYRINGE) ×6 IMPLANT
TOWEL OR 17X24 6PK STRL BLUE (TOWEL DISPOSABLE) ×3 IMPLANT
TOWEL OR 17X26 10 PK STRL BLUE (TOWEL DISPOSABLE) ×3 IMPLANT
WATER STERILE IRR 1000ML POUR (IV SOLUTION) ×3 IMPLANT
YANKAUER SUCT BULB TIP NO VENT (SUCTIONS) ×3 IMPLANT

## 2017-01-14 NOTE — Anesthesia Procedure Notes (Signed)
Procedure Name: Intubation Date/Time: 01/14/2017 8:42 AM Performed by: Bryson Corona, CRNA Pre-anesthesia Checklist: Patient identified, Emergency Drugs available, Suction available and Patient being monitored Patient Re-evaluated:Patient Re-evaluated prior to induction Oxygen Delivery Method: Circle System Utilized Preoxygenation: Pre-oxygenation with 100% oxygen Induction Type: IV induction Ventilation: Oral airway inserted - appropriate to patient size and Two handed mask ventilation required Laryngoscope Size: Mac and 4 Grade View: Grade II Tube type: Oral Tube size: 7.0 mm Number of attempts: 1 Airway Equipment and Method: Stylet and Oral airway Placement Confirmation: ETT inserted through vocal cords under direct vision,  positive ETCO2 and breath sounds checked- equal and bilateral Secured at: 22 cm Tube secured with: Tape Dental Injury: Teeth and Oropharynx as per pre-operative assessment

## 2017-01-14 NOTE — H&P (Signed)
PREOPERATIVE H&P  Chief Complaint: Bilateral leg pain  HPI: [name] is a 52 y.o. male who presents with ongoing pain in the bilateral legs x 30 years  MRI reveals spinal stenosis at L3/4  Patient has failed multiple forms of conservative care and continues to have pain (see office notes for additional details regarding the patient's full course of treatment)  Past Medical History:  Diagnosis Date  . Adult night terrors 03/29/2014  . Ankylosing spondylitis (HCC)   . Anxiety   . Asthma    EXERCISE INDUCED  RARELY  USES INHALER  . Depression   . Hypothyroidism   . Night terrors   . Sleep apnea    SLEEPS W/ MOUTH GUARD    Past Surgical History:  Procedure Laterality Date  . KNEE SURGERY Left   . TONSILLECTOMY AND ADENOIDECTOMY  1992  . WISDOM TOOTH EXTRACTION     Social History   Socioeconomic History  . Marital status: Married    Spouse name: None  . Number of children: 3  . Years of education: 2313  . Highest education level: None  Social Needs  . Financial resource strain: None  . Food insecurity - worry: None  . Food insecurity - inability: None  . Transportation needs - medical: None  . Transportation needs - non-medical: None  Occupational History  . None  Tobacco Use  . Smoking status: Never Smoker  . Smokeless tobacco: Never Used  Substance and Sexual Activity  . Alcohol use: Yes    Alcohol/week: 0.0 oz    Comment: Two drinks daily  . Drug use: No  . Sexual activity: None  Other Topics Concern  . None  Social History Narrative   Patient lives at home with his wife [name]([name]).   Patient works full time [employer].   Education some college.   Right handed.   Caffeine two cups of coffee daily.    Family History  Problem Relation Age of Onset  . Aneurysm Mother   . Stroke Father    No Known Allergies Prior to Admission medications   Medication Sig Start Date End Date Taking? Authorizing Provider  albuterol (PROVENTIL  HFA;VENTOLIN HFA) 108 (90 Base) MCG/ACT inhaler Inhale 2 puffs into the lungs every 6 (six) hours as needed for wheezing or shortness of breath.   Yes [provider]  cloNIDine (CATAPRES) 0.1 MG tablet Take 1 tablet (0.1 mg total) at bedtime by mouth. 11/26/16  Yes Dohmeier, Porfirio Mylararmen, MD  dexmethylphenidate (FOCALIN XR) 20 MG 24 hr capsule Take 1 capsule (20 mg total) by mouth daily. 11/04/16  Yes Dohmeier, Porfirio Mylararmen, MD  HUMIRA PEN 40 MG/0.8ML PNKT Inject 40 mg into the skin See admin instructions. Twice monthly (1st & 15th) 07/26/14  Yes [provider]  levothyroxine (SYNTHROID, LEVOTHROID) 50 MCG tablet Take 50 mcg by mouth daily. 12/04/16  Yes [provider]  meloxicam (MOBIC) 15 MG tablet Take 15 mg by mouth daily.  11/03/16  Yes [provider]  methocarbamol (ROBAXIN) 750 MG tablet TAKE 1 TABLET DAILY 12/19/16  Yes [provider]  methylphenidate (RITALIN) 10 MG tablet Take 1 tablet (10 mg total) by mouth 2 (two) times daily. 11/04/16  Yes Dohmeier, Porfirio Mylararmen, MD  HYDROcodone-acetaminophen (NORCO/VICODIN) 5-325 MG per tablet Take 2 tablets by mouth every 4 (four) hours as needed. Patient not taking: Reported on 12/30/2016 09/20/14   Dohmeier, Porfirio Mylararmen, MD     All other systems have been reviewed and were  otherwise negative with the exception of those mentioned in the HPI and as above.  Physical Exam: Vitals:   01/14/17 0725  BP: (!) 152/98  Pulse: 70  Resp: 18  Temp: (!) 97.3 F (36.3 C)  SpO2: 98%    There is no height or weight on file to calculate BMI.  General: Alert, no acute distress Cardiovascular: No pedal edema Respiratory: No cyanosis, no use of accessory musculature Skin: No lesions in the area of chief complaint Neurologic: Sensation intact distally Psychiatric: Patient is competent for consent with normal mood and affect Lymphatic: No axillary or cervical lymphadenopathy   Assessment/Plan: BILATERAL LEG PAIN Plan for  Procedure(s): LUMBAR 3-4 DECOMPRESSION   Emilee Hero, MD 01/14/2017 8:06 AM

## 2017-01-14 NOTE — Transfer of Care (Signed)
Immediate Anesthesia Transfer of Care Note  Patient: [name]  Procedure(s) Performed: LUMBAR 3-4 DECOMPRESSION (N/A )  Patient Location: PACU  Anesthesia Type:General  Level of Consciousness: drowsy  Airway & Oxygen Therapy: Patient Spontanous Breathing and Patient connected to face mask oxygen  Post-op Assessment: Report given to RN and Post -op Vital signs reviewed and stable  Post vital signs: Reviewed and stable  Last Vitals:  Vitals:   01/14/17 0725  BP: (!) 152/98  Pulse: 70  Resp: 18  Temp: (!) 36.3 C  SpO2: 98%    Last Pain:  Vitals:   01/14/17 0725  TempSrc: Oral  PainSc:       Patients Stated Pain Goal: 3 (01/14/17 0704)  Complications: No apparent anesthesia complications

## 2017-01-15 ENCOUNTER — Encounter (HOSPITAL_COMMUNITY): Payer: Self-pay | Admitting: Orthopedic Surgery

## 2017-01-15 NOTE — Anesthesia Postprocedure Evaluation (Signed)
Anesthesia Post Note  Patient: [name]  Procedure(s) Performed: LUMBAR 3-4 DECOMPRESSION (N/A )     Patient location during evaluation: PACU Anesthesia Type: General Level of consciousness: awake and alert Pain management: pain level controlled Vital Signs Assessment: post-procedure vital signs reviewed and stable Respiratory status: spontaneous breathing, nonlabored ventilation, respiratory function stable and patient connected to nasal cannula oxygen Cardiovascular status: blood pressure returned to baseline and stable Postop Assessment: no apparent nausea or vomiting Anesthetic complications: no    Last Vitals:  Vitals:   01/14/17 1340 01/14/17 1424  BP: (!) 148/89 (!) 146/101  Pulse: 84 86  Resp: 18 15  Temp: 36.7 C 36.7 C  SpO2: 95% 97%     Pain Goal: Patients Stated Pain Goal: 3 (01/14/17 0704)               Shelton SilvasKevin D Kristine Tiley

## 2017-01-15 NOTE — Op Note (Signed)
NAMLearta Harrison:  Harrison, Joe               ACCOUNT NO.:  [id number]  MEDICAL RECORD NO.:  [id number]  PHYSICIAN:  Estill BambergMark Gavriel Holzhauer, MD           DATE OF BIRTH:  DATE OF PROCEDURE:  01/14/2017                              OPERATIVE REPORT   PREOPERATIVE DIAGNOSES: 1. L3-4 spinal stenosis. 2. L3-4 disk herniation. 3. 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  POSTOPERATIVE DIAGNOSES: 1. L3-4 spinal stenosis. 2. L3-4 disk herniation. 3. 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  PROCEDURE:  L3-4 decompression with laminectomy and bilateral partial facetectomy, including removal of herniated L3-4 intervertebral disk fragment.  SURGEON:  Estill BambergMark Mitsugi Schrader, MD.  ASSISTANJason Harrison:  Joe McKenzie, PA-C.  ANESTHESIA:  General endotracheal anesthesia.  COMPLICATIONS:  None.  DISPOSITION:  Stable.  ESTIMATED BLOOD LOSS:  200 mL.  INDICATIONS FOR SURGERY:  Briefly, [name] is a very pleasant 52 year old male, who did present to me with ongoing pain in his bilateral legs and back.  The back pain has been present for many years.  The leg pain had been present for about a year, and did increase over the course of the last few months.  The patient's MRI did reveal the findings noted above.  We did proceed with appropriate nonoperative treatment measures, but he did continue to have ongoing pain.  Given the patient's ongoing dysfunction, we did discuss proceeding with a decompressive procedure. The patient was fully aware of the risks and limitations of surgery and did wish to proceed.  OPERATIVE DETAILS:  On January 14, 2017, the patient was brought to surgery and general endotracheal anesthesia was administered.  The patient was placed prone on a well-padded flat Jackson bed with a Wilson frame.  Antibiotics were given and a time-out procedure was performed after the prepping and draping the back in the usual fashion.  At this point, a midline incision was made  overlying the L3-4 intervertebral space.  The L3 and L4 lamina were subperiosteally exposed after incising the midline of the fascia.  A self-retaining retractor was placed.  At this point, a thorough and complete L3-4 decompression was performed. The L3 spinous process was removed and a bilateral partial facetectomy was performed on the left and on the right sides.  In doing so, I was able to thoroughly remove all the ligamentum flavum and facet hypertrophy compressing the spinal canal.  At this point, I turned my attention to the right lateral recess and the traversing right L4 nerve. When the assistant holding medial retraction of the right L4 nerve, a readily obvious L3-4 disk protrusion was identified, which did appear to be compressive.  Therefore, I did perform an annulotomy and using a series of straight and upward biting micro pituitaries, I did remove the protruding fragments compressing the spinal canal.  I did feel that removal of the protruding disk fragments did adequately decompress both  the right and left sides.  At this point, the wound was copiously irrigated.  I did also irrigate the intervertebral space and the wound using saline mixed with bacitracin and polymyxin B to help minimize the risk of infection.  All bleeding was then controlled using bipolar electrocautery, in addition to Surgiflo.  At this point, 20 mg of Depo-Medrol was introduced about the epidural space.  The fascia  was then closed using #1 Vicryl followed by 2-0 Vicryl, followed by 4-0 Monocryl.  Benzoin and Steri-Strips were applied, followed by sterile dressing.  All instrument counts were correct at the termination of the procedure.  Of note, Joe Coop, PA-C, was my assistant throughout surgery, and did aid in retraction suctioning and closure from start to finish.     Estill Bamberg, MD   ______________________________ Estill Bamberg, MD    MD/MEDQ  D:  01/14/2017  T:  01/14/2017   Job:  [id number]

## 2017-01-19 ENCOUNTER — Other Ambulatory Visit: Payer: Self-pay | Admitting: Neurology

## 2017-01-19 ENCOUNTER — Telehealth: Payer: Self-pay | Admitting: Neurology

## 2017-01-19 DIAGNOSIS — G4730 Sleep apnea, unspecified: Secondary | ICD-10-CM

## 2017-01-19 DIAGNOSIS — G4710 Hypersomnia, unspecified: Secondary | ICD-10-CM

## 2017-01-19 DIAGNOSIS — F901 Attention-deficit hyperactivity disorder, predominantly hyperactive type: Secondary | ICD-10-CM

## 2017-01-19 DIAGNOSIS — G4733 Obstructive sleep apnea (adult) (pediatric): Secondary | ICD-10-CM

## 2017-01-19 DIAGNOSIS — Z9989 Dependence on other enabling machines and devices: Secondary | ICD-10-CM

## 2017-01-19 MED ORDER — METHYLPHENIDATE HCL 10 MG PO TABS: 10.0000 mg | ORAL_TABLET | Freq: Two times a day (BID) | ORAL | 0 refills | Status: DC

## 2017-01-19 MED ORDER — DEXMETHYLPHENIDATE HCL ER 20 MG PO CP24: 20.0000 mg | ORAL_CAPSULE | Freq: Every day | ORAL | 0 refills | Status: DC

## 2017-01-19 NOTE — Telephone Encounter (Signed)
Pt is requesting a refill for dexmethylphenidate (FOCALIN XR) 20 MG 24 hr capsule and methylphenidate (RITALIN) 10 MG tablet

## 2017-01-19 NOTE — Telephone Encounter (Signed)
The scripts will be ready for the patient at the front desk.

## 2017-01-27 ENCOUNTER — Ambulatory Visit: Payer: 59 | Admitting: Nurse Practitioner

## 2017-01-29 ENCOUNTER — Encounter (INDEPENDENT_AMBULATORY_CARE_PROVIDER_SITE_OTHER): Payer: 59

## 2017-02-12 ENCOUNTER — Encounter (INDEPENDENT_AMBULATORY_CARE_PROVIDER_SITE_OTHER): Payer: 59

## 2017-02-16 ENCOUNTER — Ambulatory Visit (INDEPENDENT_AMBULATORY_CARE_PROVIDER_SITE_OTHER): Payer: 59 | Admitting: Family Medicine

## 2017-02-16 ENCOUNTER — Encounter (INDEPENDENT_AMBULATORY_CARE_PROVIDER_SITE_OTHER): Payer: Self-pay | Admitting: Family Medicine

## 2017-02-16 VITALS — BP 129/78 | HR 67 | Temp 97.6°F | Ht 72.0 in | Wt 253.0 lb

## 2017-02-16 DIAGNOSIS — E038 Other specified hypothyroidism: Secondary | ICD-10-CM

## 2017-02-16 DIAGNOSIS — K760 Fatty (change of) liver, not elsewhere classified: Secondary | ICD-10-CM

## 2017-02-16 DIAGNOSIS — R5383 Other fatigue: Secondary | ICD-10-CM | POA: Diagnosis not present

## 2017-02-16 DIAGNOSIS — Z1331 Encounter for screening for depression: Secondary | ICD-10-CM

## 2017-02-16 DIAGNOSIS — Z0289 Encounter for other administrative examinations: Secondary | ICD-10-CM

## 2017-02-16 DIAGNOSIS — Z9189 Other specified personal risk factors, not elsewhere classified: Secondary | ICD-10-CM

## 2017-02-16 DIAGNOSIS — G4733 Obstructive sleep apnea (adult) (pediatric): Secondary | ICD-10-CM

## 2017-02-16 DIAGNOSIS — Z6834 Body mass index (BMI) 34.0-34.9, adult: Secondary | ICD-10-CM

## 2017-02-16 DIAGNOSIS — E669 Obesity, unspecified: Secondary | ICD-10-CM

## 2017-02-17 NOTE — Progress Notes (Signed)
Office: [phone]  /  Fax: [phone]   Dear Dr. Cliffton Asters,   Thank you for referring [name] to our clinic. The following note includes my evaluation and treatment recommendations.  HPI:   Chief Complaint: OBESITY    [name] has been referred by Laurann Montana, MD for consultation regarding his obesity and obesity related comorbidities.    [name] (MR# [id number]) is a 52 y.o. male who presents on 02/16/2017 for obesity evaluation and treatment. Current BMI is Body mass index is 34.31 kg/m.Marland Kitchen [name] has been struggling with his weight for many years and has been unsuccessful in either losing weight, maintaining weight loss, or reaching his healthy weight goal.     [name] attended our information session and states he is currently in the action stage of change and ready to dedicate time achieving and maintaining a healthier weight. [name] is interested in becoming our patient and working on intensive lifestyle modifications including (but not limited to) diet, exercise and weight loss.    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 he has significant food cravings issues  he snacks frequently in the evenings he skips meals frequently he is frequently drinking liquids with calories he frequently makes poor food choices he frequently eats larger portions than normal  he struggles with emotional eating   Fatigue [name] feels his energy is lower than it should be. This has worsened with weight gain and has not worsened recently. [name] admits to daytime somnolence and denies waking up still tired. Patient is at risk for obstructive sleep apnea. Patent has a history of symptoms of daytime fatigue. Patient generally gets 7 hours of sleep per night, and  states they generally have generally restful sleep. Snoring is present. Apneic episodes are not present. 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 He has a history of fatty liver and he denies abdominal pain or jaundice. He denies excessive alcohol intake.  Hypothyroidism [name] has a diagnosis of hypothyroidism. He is on levothyroxine. He denies hot or cold intolerance or palpitations, but does admit to ongoing fatigue.  Sleep Apnea [name] is not on CPAP, he uses a mouth piece while sleeping.  At risk for cardiovascular disease [name] is at a higher than average risk for cardiovascular disease due to obesity. He currently denies any chest pain.  Depression Screen [name]'s Food and Mood (modified PHQ-9) score was  Depression screen PHQ 2/9 02/16/2017  Decreased Interest 0  Down, Depressed, Hopeless 0  PHQ - 2 Score 0  Altered sleeping 1  Tired, decreased energy 0  Change in appetite 1  Feeling bad or failure about yourself  0  Trouble concentrating 0  Moving slowly or fidgety/restless 0  Suicidal thoughts 0  PHQ-9 Score 2  Difficult doing work/chores Not difficult at all    ALLERGIES: No Known Allergies  MEDICATIONS: Current Outpatient Medications on File Prior to Visit  Medication Sig Dispense Refill  . cloNIDine (CATAPRES) 0.1 MG tablet Take 1 tablet (0.1 mg total) at bedtime by mouth. 30 tablet 11  . dexmethylphenidate (FOCALIN XR) 20 MG 24 hr capsule Take 1 capsule (20 mg total) by mouth daily. 30 capsule 0  . HUMIRA PEN 40  MG/0.8ML PNKT Inject 40 mg into the skin See admin instructions. Twice monthly (1st & 15th)  1  . levothyroxine (SYNTHROID, LEVOTHROID) 50 MCG tablet Take 50 mcg by mouth daily.  3  . meloxicam (MOBIC) 15 MG tablet Take 15 mg by mouth daily.    . methocarbamol (ROBAXIN) 750 MG tablet TAKE 1 TABLET DAILY  2  . methylphenidate (RITALIN) 10 MG tablet Take 1 tablet (10 mg total) by mouth 2 (two)  times daily. 60 tablet 0   Current Facility-Administered Medications on File Prior to Visit  Medication Dose Route Frequency Provider Last Rate Last Dose  . gadopentetate dimeglumine (MAGNEVIST) injection 20 mL  20 mL Intravenous Once PRN Dohmeier, Porfirio Mylararmen, MD        PAST MEDICAL HISTORY: Past Medical History:  Diagnosis Date  . ADD (attention deficit disorder)   . Adult night terrors 03/29/2014  . Alcohol abuse   . Ankylosing spondylitis (HCC)   . Anxiety   . Asthma    EXERCISE INDUCED  RARELY  USES INHALER  . Back pain   . Depression   . Drug use   . Fatty liver   . Hypothyroidism   . IBS (irritable bowel syndrome)   . Joint pain   . Night terrors   . Sleep apnea    SLEEPS W/ MOUTH GUARD     PAST SURGICAL HISTORY: Past Surgical History:  Procedure Laterality Date  . KNEE SURGERY Left   . LUMBAR LAMINECTOMY/DECOMPRESSION MICRODISCECTOMY N/A 01/14/2017   Procedure: LUMBAR 3-4 DECOMPRESSION;  Surgeon: Estill Bambergumonski, Mark, MD;  Location: [facility] OR;  Service: Orthopedics;  Laterality: N/A;  LUMBAR 2-4 DECOMPRESSION / 2 HRS FOR CASE.  . TONSILLECTOMY AND ADENOIDECTOMY  1992  . WISDOM TOOTH EXTRACTION      SOCIAL HISTORY: Social History   Tobacco Use  . Smoking status: Former Smoker    Years: 4.00    Types: Cigarettes    Last attempt to quit: 01/16/1986    Years since quitting: 31.1  . Smokeless tobacco: Never Used  Substance Use Topics  . Alcohol use: Yes    Alcohol/week: 0.0 oz    Comment: Two drinks daily  . Drug use: Yes    Types: Marijuana    FAMILY HISTORY: Family History  Problem Relation Age of Onset  . Aneurysm Mother   . Sudden death Mother   . Stroke Father   . Hypertension Father   . Thyroid disease Father   . Alcoholism Father     ROS: Review of Systems  Constitutional: Positive for malaise/fatigue. Negative for weight loss.       Negative hot/cold intolerance  HENT: Positive for tinnitus.        Dry mouth  Eyes: Positive for photophobia.       Wear  glasses or contacts Floaters  Negative Jaundice  Cardiovascular: Negative for chest pain and palpitations.  Gastrointestinal: Negative for abdominal pain.  Musculoskeletal: Positive for back pain.  Psychiatric/Behavioral: Positive for depression. Negative for suicidal ideas.    PHYSICAL EXAM: Blood pressure 129/78, pulse 67, temperature 97.6 F (36.4 C), temperature source Oral, height 6' (1.829 m), weight 253 lb (114.8 kg), SpO2 97 %. Body mass index is 34.31 kg/m. Physical Exam  Constitutional: He is oriented to person, place, and time. He appears well-developed and well-nourished.  HENT:  Head: Normocephalic and atraumatic.  Nose: Nose normal.  Eyes: EOM are normal. No scleral icterus.  Neck: Normal range of motion. Neck supple. No thyromegaly present.  Cardiovascular: Normal  rate and regular rhythm.  Pulmonary/Chest: Effort normal. No respiratory distress.  Abdominal: Soft. There is no tenderness.  + Obesity  Musculoskeletal:  Range of Motion normal in all 4 extremities Trace edema noted in bilateral lower extremities  Neurological: He is alert and oriented to person, place, and time. Coordination normal.  Skin: Skin is warm and dry.  Psychiatric: He has a normal mood and affect. His behavior is normal.  Vitals reviewed.   RECENT LABS AND TESTS: BMET    Component Value Date/Time   NA 140 02/16/2017 1142   K 4.4 02/16/2017 1142   CL 103 02/16/2017 1142   CO2 20 02/16/2017 1142   GLUCOSE 93 02/16/2017 1142   GLUCOSE 124 (H) 01/09/2017 1231   BUN 18 02/16/2017 1142   CREATININE 0.91 02/16/2017 1142   CALCIUM 9.7 02/16/2017 1142   GFRNONAA 97 02/16/2017 1142   GFRAA 112 02/16/2017 1142   Lab Results  Component Value Date   HGBA1C 5.3 02/16/2017   Lab Results  Component Value Date   INSULIN WILL FOLLOW 02/16/2017   CBC    Component Value Date/Time   WBC 5.5 01/09/2017 1231   RBC 5.50 01/09/2017 1231   HGB 15.2 01/09/2017 1231   HCT 45.9 01/09/2017 1231     PLT 173 01/09/2017 1231   MCV 83.5 01/09/2017 1231   MCH 27.6 01/09/2017 1231   MCHC 33.1 01/09/2017 1231   RDW 13.3 01/09/2017 1231   LYMPHSABS 2.0 01/09/2017 1231   MONOABS 0.4 01/09/2017 1231   EOSABS 0.2 01/09/2017 1231   BASOSABS 0.0 01/09/2017 1231   Iron/TIBC/Ferritin/ %Sat No results found for: IRON, TIBC, FERRITIN, IRONPCTSAT Lipid Panel     Component Value Date/Time   CHOL 130 02/16/2017 1142   TRIG 118 02/16/2017 1142   HDL 37 (L) 02/16/2017 1142   LDLCALC 69 02/16/2017 1142   Hepatic Function Panel     Component Value Date/Time   PROT 7.6 02/16/2017 1142   ALBUMIN 4.7 02/16/2017 1142   AST 45 (H) 02/16/2017 1142   ALT 78 (H) 02/16/2017 1142   ALKPHOS 64 02/16/2017 1142   BILITOT 0.7 02/16/2017 1142      Component Value Date/Time   TSH WILL FOLLOW 02/16/2017 1142    ECG  shows NSR with a rate of 70 BPM INDIRECT CALORIMETER done today shows a VO2 of 349 and a REE of 2426.  His calculated basal metabolic rate is 7829 thus his basal metabolic rate is better than expected.    ASSESSMENT AND PLAN: Other fatigue - Plan: EKG 12-Lead, Hemoglobin A1c, Insulin, random, VITAMIN D 25 Hydroxy (Vit-D Deficiency, Fractures)  NAFLD (nonalcoholic fatty liver disease) - Plan: Comprehensive metabolic panel, Lipid Panel With LDL/HDL Ratio  Other specified hypothyroidism - Plan: T3, T4, free, TSH  Obstructive sleep apnea syndrome  At risk for heart disease  Depression screening  Class 1 obesity with serious comorbidity and body mass index (BMI) of 34.0 to 34.9 in adult, unspecified obesity type  PLAN:  Fatigue [name] was informed that his fatigue may be related to obesity, depression or many other causes. Labs will be ordered, and in the meanwhile Colburn has agreed to work on diet, exercise and weight loss to help with fatigue. Proper sleep hygiene was discussed including the need for 7-8 hours of quality sleep each night. A sleep study was not ordered based on  symptoms and Epworth score.  Non-Alcoholic Fatty Liver Disease We discussed the likely diagnosis of non alcoholic fatty liver disease today  and how this condition is obesity related. Rhiley was educated on his risk of developing NASH or even liver failure and weight loss is the best treatment for NAFLD. [name] agreed to continue with his weight loss efforts with healthier diet and exercise as an essential part of his treatment plan.  Hypothyroidism [name] was informed of the importance of good thyroid control to help with weight loss efforts. He was also informed that supertheraputic thyroid levels are dangerous and will not improve weight loss results. EKG done and we will check labs. [name] agrees to continue levothyroxine as is for now and he agrees to follow up with our clinic in 2 weeks.  Sleep Apnea We discussed sleep apnea, IC, and weight loss recommended. [name] agrees to follow up with our clinic in 2 weeks.  Cardiovascular risk counselling [name] was given extended (15 minutes) coronary artery disease prevention counseling today. He is 52 y.o. male and has risk factors for heart disease including obesity. We discussed intensive lifestyle modifications today with an emphasis on specific weight loss instructions and strategies. Pt was also informed of the importance of increasing exercise and decreasing saturated fats to help prevent heart disease.  Depression Screen [name] had a negative depression screening. Depression is commonly associated with obesity and often results in emotional eating behaviors. We will monitor this closely and work on CBT to help improve the non-hunger eating patterns. Referral to Psychology may be required if no improvement is seen as he continues in our clinic.  Obesity [name] is currently in the action stage of change and his goal is to continue with weight loss efforts. I recommend Norlan begin the structured treatment plan as follows:  He has agreed to  follow the Category 3 plan + 300 calories Keonte has been instructed to eventually work up to a goal of 150 minutes of combined cardio and strengthening exercise per week for weight loss and overall health benefits. We discussed the following Behavioral Modification Strategies today: increasing lean protein intake, decreasing simple carbohydrates  and work on meal planning and easy cooking plans   He was informed of the importance of frequent follow up visits to maximize his success with intensive lifestyle modifications for his multiple health conditions. He was informed we would discuss his lab results at his next visit unless there is a critical issue that needs to be addressed sooner. Neeko agreed to keep his next visit at the agreed upon time to discuss these results.    OBESITY BEHAVIORAL INTERVENTION VISIT  Today's visit was # 1 out of 22.  Starting weight: 253 lbs Starting date: 02/16/17 Today's weight : 253 lbs  Today's date: 02/16/2017 Total lbs lost to date: 0 (Patients must lose 7 lbs in the first 6 months to continue with counseling)   ASK: We discussed the diagnosis of obesity with Mendy J Sweaney today and Yusuke agreed to give [name] permission to discuss obesity behavioral modification therapy today.  ASSESS: Lowen has the diagnosis of obesity and his BMI today is 34.31 Elex is in the action stage of change   ADVISE: Siegfried was educated on the multiple health risks of obesity as well as the benefit of weight loss to improve his health. He was advised of the need for long term treatment and the importance of lifestyle modifications.  AGREE: Multiple dietary modification options and treatment options were discussed and  Jerron agreed to the above obesity treatment plan.   Trude Mcburney, am acting as Energy manager for  [employer]  Dalbert Garnet, MD  I have reviewed the above documentation for accuracy and completeness, and I agree with the above. -Quillian Quince, MD

## 2017-02-18 LAB — COMPREHENSIVE METABOLIC PANEL
ALBUMIN: 4.7 g/dL (ref 3.5–5.5)
ALK PHOS: 64 IU/L (ref 39–117)
ALT: 78 IU/L — ABNORMAL HIGH (ref 0–44)
AST: 45 IU/L — ABNORMAL HIGH (ref 0–40)
Albumin/Globulin Ratio: 1.6 (ref 1.2–2.2)
BUN / CREAT RATIO: 20 (ref 9–20)
BUN: 18 mg/dL (ref 6–24)
Bilirubin Total: 0.7 mg/dL (ref 0.0–1.2)
CALCIUM: 9.7 mg/dL (ref 8.7–10.2)
CO2: 20 mmol/L (ref 20–29)
CREATININE: 0.91 mg/dL (ref 0.76–1.27)
Chloride: 103 mmol/L (ref 96–106)
GFR calc Af Amer: 112 mL/min/{1.73_m2} (ref >59)
GFR, EST NON AFRICAN AMERICAN: 97 mL/min/{1.73_m2} (ref >59)
GLUCOSE: 93 mg/dL (ref 65–99)
Globulin, Total: 2.9 g/dL (ref 1.5–4.5)
Potassium: 4.4 mmol/L (ref 3.5–5.2)
Sodium: 140 mmol/L (ref 134–144)
Total Protein: 7.6 g/dL (ref 6.0–8.5)

## 2017-02-18 LAB — T4, FREE: FREE T4: 1.41 ng/dL (ref 0.82–1.77)

## 2017-02-18 LAB — LIPID PANEL WITH LDL/HDL RATIO
CHOLESTEROL TOTAL: 130 mg/dL (ref 100–199)
HDL: 37 mg/dL — AB (ref >39)
LDL CALC: 69 mg/dL (ref 0–99)
LDl/HDL Ratio: 1.9 ratio (ref 0.0–3.6)
TRIGLYCERIDES: 118 mg/dL (ref 0–149)
VLDL Cholesterol Cal: 24 mg/dL (ref 5–40)

## 2017-02-18 LAB — TSH: TSH: 3.43 u[IU]/mL (ref 0.450–4.500)

## 2017-02-18 LAB — T3: T3, Total: 109 ng/dL (ref 71–180)

## 2017-02-18 LAB — VITAMIN D 25 HYDROXY (VIT D DEFICIENCY, FRACTURES): Vit D, 25-Hydroxy: 19.3 ng/mL — ABNORMAL LOW (ref 30.0–100.0)

## 2017-02-18 LAB — HEMOGLOBIN A1C
Est. average glucose Bld gHb Est-mCnc: 105 mg/dL
HEMOGLOBIN A1C: 5.3 % (ref 4.8–5.6)

## 2017-02-18 LAB — INSULIN, RANDOM: INSULIN: 10 u[IU]/mL (ref 2.6–24.9)

## 2017-03-03 ENCOUNTER — Ambulatory Visit (INDEPENDENT_AMBULATORY_CARE_PROVIDER_SITE_OTHER): Payer: 59 | Admitting: Family Medicine

## 2017-03-03 VITALS — BP 126/79 | HR 65 | Temp 97.9°F | Ht 72.0 in | Wt 247.0 lb

## 2017-03-03 DIAGNOSIS — E669 Obesity, unspecified: Secondary | ICD-10-CM

## 2017-03-03 DIAGNOSIS — E559 Vitamin D deficiency, unspecified: Secondary | ICD-10-CM | POA: Diagnosis not present

## 2017-03-03 DIAGNOSIS — E8881 Metabolic syndrome: Secondary | ICD-10-CM | POA: Insufficient documentation

## 2017-03-03 DIAGNOSIS — Z6833 Body mass index (BMI) 33.0-33.9, adult: Secondary | ICD-10-CM | POA: Diagnosis not present

## 2017-03-03 DIAGNOSIS — K760 Fatty (change of) liver, not elsewhere classified: Secondary | ICD-10-CM

## 2017-03-03 DIAGNOSIS — Z9189 Other specified personal risk factors, not elsewhere classified: Secondary | ICD-10-CM | POA: Diagnosis not present

## 2017-03-03 DIAGNOSIS — E88819 Insulin resistance, unspecified: Secondary | ICD-10-CM | POA: Insufficient documentation

## 2017-03-03 MED ORDER — VITAMIN D (ERGOCALCIFEROL) 1.25 MG (50000 UNIT) PO CAPS: 50000.0000 [IU] | ORAL_CAPSULE | ORAL | 0 refills | Status: DC

## 2017-03-03 NOTE — Progress Notes (Signed)
Office: [phone]  /  Fax: [phone]   HPI:   Chief Complaint: OBESITY [name] is here to discuss his progress with his obesity treatment plan. He is on the Category 3 plan +300 calories and is following his eating plan approximately 99 % of the time. He states he is exercising with elliptical for 15 minutes 2 times per week. [name] has done well with weight loss on his category 3 plan. He states his hunger was controlled. He is drinking beer, but is limiting his portions to his snack calories. His weight is 247 lb (112 kg) today and has had a weight loss of 6 pounds over a period of 2 weeks since his last visit. He has lost 6 lbs since starting treatment with [name].  Vitamin D deficiency [name] has a new diagnosis of vitamin D deficiency. He is not currently taking vit D and admits fatigue, but denies nausea, vomiting or muscle weakness.   Ref. Range 02/16/2017 11:42  Vitamin D, 25-Hydroxy Latest Ref Range: 30.0 - 100.0 ng/mL 19.3 (L)   Insulin Resistance [name] has a new diagnosis of insulin resistance based on his elevated fasting insulin level >5. Although [name]'s blood glucose readings and A1c are within normal limits still, insulin resistance puts him at greater risk of metabolic syndrome and diabetes.   At risk for diabetes [name] is at higher than average risk for developing diabetes due to his obesity and insulin resistance. He currently denies polyuria or polydipsia.  Non Alcoholic Fatty Liver Disease [name] had a previous dx of NAFLD. His AST and ALT are mildly elevated. He denies abdominal pain or jaundice. He denies excessive alcohol intake.  ALLERGIES: No Known Allergies  MEDICATIONS: Current Outpatient Medications on File Prior to Visit  Medication Sig Dispense Refill  . cloNIDine (CATAPRES) 0.1 MG tablet Take 1 tablet (0.1 mg total) at bedtime by mouth. 30 tablet 11  . dexmethylphenidate (FOCALIN XR) 20 MG 24 hr capsule Take 1 capsule (20 mg total) by mouth daily.  30 capsule 0  . HUMIRA PEN 40 MG/0.8ML PNKT Inject 40 mg into the skin See admin instructions. Twice monthly (1st & 15th)  1  . levothyroxine (SYNTHROID, LEVOTHROID) 50 MCG tablet Take 50 mcg by mouth daily.  3  . meloxicam (MOBIC) 15 MG tablet Take 15 mg by mouth daily.    . methocarbamol (ROBAXIN) 750 MG tablet TAKE 1 TABLET DAILY  2  . methylphenidate (RITALIN) 10 MG tablet Take 1 tablet (10 mg total) by mouth 2 (two) times daily. 60 tablet 0   Current Facility-Administered Medications on File Prior to Visit  Medication Dose Route Frequency Provider Last Rate Last Dose  . gadopentetate dimeglumine (MAGNEVIST) injection 20 mL  20 mL Intravenous Once PRN Dohmeier, Porfirio Mylar, MD        PAST MEDICAL HISTORY: Past Medical History:  Diagnosis Date  . ADD (attention deficit disorder)   . Adult night terrors 03/29/2014  . Alcohol abuse   . Ankylosing spondylitis (HCC)   . Anxiety   . Asthma    EXERCISE INDUCED  RARELY  USES INHALER  . Back pain   . Depression   . Drug use   . Fatty liver   . Hypothyroidism   . IBS (irritable bowel syndrome)   . Joint pain   . Night terrors   . Sleep apnea    SLEEPS W/ MOUTH GUARD     PAST SURGICAL HISTORY: Past Surgical History:  Procedure Laterality Date  . KNEE SURGERY Left   .  LUMBAR LAMINECTOMY/DECOMPRESSION MICRODISCECTOMY N/A 01/14/2017   Procedure: LUMBAR 3-4 DECOMPRESSION;  Surgeon: Estill Bamberg, MD;  Location: [facility] OR;  Service: Orthopedics;  Laterality: N/A;  LUMBAR 2-4 DECOMPRESSION / 2 HRS FOR CASE.  . TONSILLECTOMY AND ADENOIDECTOMY  1992  . WISDOM TOOTH EXTRACTION      SOCIAL HISTORY: Social History   Tobacco Use  . Smoking status: Former Smoker    Years: 4.00    Types: Cigarettes    Last attempt to quit: 01/16/1986    Years since quitting: 31.1  . Smokeless tobacco: Never Used  Substance Use Topics  . Alcohol use: Yes    Alcohol/week: 0.0 oz    Comment: Two drinks daily  . Drug use: Yes    Types: Marijuana    FAMILY  HISTORY: Family History  Problem Relation Age of Onset  . Aneurysm Mother   . Sudden death Mother   . Stroke Father   . Hypertension Father   . Thyroid disease Father   . Alcoholism Father     ROS: Review of Systems  Constitutional: Positive for malaise/fatigue and weight loss.  Gastrointestinal: Negative for abdominal pain, nausea and vomiting.  Genitourinary: Negative for frequency.  Musculoskeletal:       Negative for muscle weakness  Skin:       Negative for jaundice  Endo/Heme/Allergies: Negative for polydipsia.    PHYSICAL EXAM: Blood pressure 126/79, pulse 65, temperature 97.9 F (36.6 C), temperature source Oral, height 6' (1.829 m), weight 247 lb (112 kg), SpO2 96 %. Body mass index is 33.5 kg/m. Physical Exam  Constitutional: He is oriented to person, place, and time. He appears well-developed and well-nourished.  Cardiovascular: Normal rate.  Pulmonary/Chest: Effort normal.  Musculoskeletal: Normal range of motion.  Neurological: He is oriented to person, place, and time.  Skin: Skin is warm and dry.  Psychiatric: He has a normal mood and affect. His behavior is normal.  Vitals reviewed.   RECENT LABS AND TESTS: BMET    Component Value Date/Time   NA 140 02/16/2017 1142   K 4.4 02/16/2017 1142   CL 103 02/16/2017 1142   CO2 20 02/16/2017 1142   GLUCOSE 93 02/16/2017 1142   GLUCOSE 124 (H) 01/09/2017 1231   BUN 18 02/16/2017 1142   CREATININE 0.91 02/16/2017 1142   CALCIUM 9.7 02/16/2017 1142   GFRNONAA 97 02/16/2017 1142   GFRAA 112 02/16/2017 1142   Lab Results  Component Value Date   HGBA1C 5.3 02/16/2017   Lab Results  Component Value Date   INSULIN 10.0 02/16/2017   CBC    Component Value Date/Time   WBC 5.5 01/09/2017 1231   RBC 5.50 01/09/2017 1231   HGB 15.2 01/09/2017 1231   HCT 45.9 01/09/2017 1231   PLT 173 01/09/2017 1231   MCV 83.5 01/09/2017 1231   MCH 27.6 01/09/2017 1231   MCHC 33.1 01/09/2017 1231   RDW 13.3  01/09/2017 1231   LYMPHSABS 2.0 01/09/2017 1231   MONOABS 0.4 01/09/2017 1231   EOSABS 0.2 01/09/2017 1231   BASOSABS 0.0 01/09/2017 1231   Iron/TIBC/Ferritin/ %Sat No results found for: IRON, TIBC, FERRITIN, IRONPCTSAT Lipid Panel     Component Value Date/Time   CHOL 130 02/16/2017 1142   TRIG 118 02/16/2017 1142   HDL 37 (L) 02/16/2017 1142   LDLCALC 69 02/16/2017 1142   Hepatic Function Panel     Component Value Date/Time   PROT 7.6 02/16/2017 1142   ALBUMIN 4.7 02/16/2017 1142   AST 45 (  H) 02/16/2017 1142   ALT 78 (H) 02/16/2017 1142   ALKPHOS 64 02/16/2017 1142   BILITOT 0.7 02/16/2017 1142      Component Value Date/Time   TSH 3.430 02/16/2017 1142     Ref. Range 02/16/2017 11:42  Vitamin D, 25-Hydroxy Latest Ref Range: 30.0 - 100.0 ng/mL 19.3 (L)   ASSESSMENT AND PLAN: Vitamin D deficiency - Plan: Vitamin D, Ergocalciferol, (DRISDOL) 50000 units CAPS capsule  Insulin resistance  NAFLD (nonalcoholic fatty liver disease)  At risk for diabetes mellitus  Class 1 obesity with serious comorbidity and body mass index (BMI) of 33.0 to 33.9 in adult, unspecified obesity type  PLAN:  Vitamin D Deficiency Thaddaeus was informed that low vitamin D levels contributes to fatigue and are associated with obesity, breast, and colon cancer. He agrees to start to take prescription Vit D @50 ,000 IU every week #4 with no refills. We will recheck labs in 3 months and will follow up for routine testing of vitamin D, at least 2-3 times per year. He was informed of the risk of over-replacement of vitamin D and agrees to not increase his dose unless he discusses this with us first. Huston agrees to follow up with our clinic in  2 weeks.  Insulin Resistance Bart will continue to work on weight loss, exercise, and decreasing simple carbohydrates in his diet to help decrease the risk of diabetes. He was informed that eating too many simple carbohydrates or too many calories at one sitting  increases the likelihood of GI side effects. We will defer metformin for now and will recheck labs in 3 months. Dezi agreed to follow up with us as directed to monitor his progress.  Diabetes risk counseling Layson was given extended (30 minutes) diabetes prevention counseling today. He is 52 y.o. male and has risk factors for diabetes including obesity and insulin resistance. We discussed intensive lifestyle modifications today with an emphasis on weight loss as well as increasing exercise and decreasing simple carbohydrates in his diet.  Non Alcoholic Fatty Liver Disease We discussed the diagnosis of non alcoholic fatty liver disease today and how this condition is obesity related. Asser was educated on his risk of developing NASH or even liver failure and the only proven treatment for NAFLD was weight loss. Levaughn agreed to continue with his weight loss efforts with healthier diet and exercise as an essential part of his treatment plan. We will recheck labs in 3 months and Ugochukwu agreed to follow up as directed.  Obesity Marvin is currently in the action stage of change. As such, his goal is to continue with weight loss efforts He has agreed to follow the Category 3 plan +300 calories [place] has been instructed to work up to a goal of 150 minutes of combined cardio and strengthening exercise per week for weight loss and overall health benefits. We discussed the following Behavioral Modification Strategies today: increasing lean protein intake, decreasing simple carbohydrates  and decrease ETOH  Abhiraj has agreed to follow up with our clinic in 2 weeks. He was informed of the importance of frequent follow up visits to maximize his success with intensive lifestyle modifications for his multiple health conditions.   OBESITY BEHAVIORAL INTERVENTION VISIT  Today's visit was # 2 out of 22.  Starting weight: 253 lbs Starting date: 02/16/17 Today's weight : 247 lbs Today's date: 03/03/2017 Total  lbs lost to date: 6 (Patients must lose 7 lbs in the first 6 months to continue with counseling)   ASK:  We discussed the diagnosis of obesity with Kieon J Cuartas today and Lola agreed to give [name] permission to discuss obesity behavioral modification therapy today.  ASSESS: Gail has the diagnosis of obesity and his BMI today is 33.49 Eh is in the action stage of change   ADVISE: Edem was educated on the multiple health risks of obesity as well as the benefit of weight loss to improve his health. He was advised of the need for long term treatment and the importance of lifestyle modifications.  AGREE: Multiple dietary modification options and treatment options were discussed and  Eytan agreed to the above obesity treatment plan.  I, [place] Crane, am acting as transcriptionist for Quillian Quince, MD  I have reviewed the above documentation for accuracy and completeness, and I agree with the above. -Quillian Quince, MD

## 2017-03-09 ENCOUNTER — Other Ambulatory Visit: Payer: Self-pay | Admitting: Neurology

## 2017-03-09 ENCOUNTER — Telehealth: Payer: Self-pay | Admitting: Neurology

## 2017-03-09 DIAGNOSIS — F901 Attention-deficit hyperactivity disorder, predominantly hyperactive type: Secondary | ICD-10-CM

## 2017-03-09 DIAGNOSIS — G4710 Hypersomnia, unspecified: Secondary | ICD-10-CM

## 2017-03-09 DIAGNOSIS — G4730 Sleep apnea, unspecified: Secondary | ICD-10-CM

## 2017-03-09 DIAGNOSIS — G4733 Obstructive sleep apnea (adult) (pediatric): Secondary | ICD-10-CM

## 2017-03-09 DIAGNOSIS — Z9989 Dependence on other enabling machines and devices: Secondary | ICD-10-CM

## 2017-03-09 MED ORDER — DEXMETHYLPHENIDATE HCL ER 20 MG PO CP24: 20.0000 mg | ORAL_CAPSULE | Freq: Every day | ORAL | 0 refills | Status: DC

## 2017-03-09 MED ORDER — METHYLPHENIDATE HCL 10 MG PO TABS: 10.0000 mg | ORAL_TABLET | Freq: Two times a day (BID) | ORAL | 0 refills | Status: DC

## 2017-03-09 NOTE — Telephone Encounter (Signed)
Scripts will be ready for the patient at the front

## 2017-03-09 NOTE — Telephone Encounter (Signed)
Patient requesting refill for methylphenidate (RITALIN) 10 MG tablet and dexmethylphenidate (FOCALIN XR) 20 MG 24 hr capsule.

## 2017-03-13 ENCOUNTER — Ambulatory Visit: Payer: 59 | Admitting: Nurse Practitioner

## 2017-03-16 ENCOUNTER — Ambulatory Visit (INDEPENDENT_AMBULATORY_CARE_PROVIDER_SITE_OTHER): Payer: 59 | Admitting: Family Medicine

## 2017-03-16 VITALS — BP 142/79 | HR 68 | Temp 98.4°F | Ht 72.0 in | Wt 243.0 lb

## 2017-03-16 DIAGNOSIS — E669 Obesity, unspecified: Secondary | ICD-10-CM

## 2017-03-16 DIAGNOSIS — Z6833 Body mass index (BMI) 33.0-33.9, adult: Secondary | ICD-10-CM

## 2017-03-16 DIAGNOSIS — R030 Elevated blood-pressure reading, without diagnosis of hypertension: Secondary | ICD-10-CM | POA: Diagnosis not present

## 2017-03-16 NOTE — Progress Notes (Signed)
Office: [phone]  /  Fax: [phone]   HPI:   Chief Complaint: OBESITY [name] is here to discuss his progress with his obesity treatment plan. He is on the Category 3 plan + 300 calories and is following his eating plan approximately 90 % of the time. He states he is doing cardio for 25 minutes 3 times per week. [name] continues to do well with weight loss. Hunger is mostly controlled and he has added exercise. He is happy with his Category 3 plan overall.  His weight is 243 lb (110.2 kg) today and has had a weight loss of 4 pounds over a period of 2 weeks since his last visit. He has lost 10 lbs since starting treatment with [name].  Elevated Blood Pressure [name] is on clonidine at night to help with nigh terrors. His blood pressure is elevated today but has been normal the last 2 visits. He denies chest pain or headache. He is working weight loss to help control his blood pressure with the goal of decreasing his risk of heart attack and stroke. [name]'s blood pressure is not currently controlled.  ALLERGIES: No Known Allergies  MEDICATIONS: Current Outpatient Medications on File Prior to Visit  Medication Sig Dispense Refill  . cloNIDine (CATAPRES) 0.1 MG tablet Take 1 tablet (0.1 mg total) at bedtime by mouth. 30 tablet 11  . dexmethylphenidate (FOCALIN XR) 20 MG 24 hr capsule Take 1 capsule (20 mg total) by mouth daily. 30 capsule 0  . HUMIRA PEN 40 MG/0.8ML PNKT Inject 40 mg into the skin See admin instructions. Twice monthly (1st & 15th)  1  . levothyroxine (SYNTHROID, LEVOTHROID) 50 MCG tablet Take 75 mcg by mouth daily.   3  . meloxicam (MOBIC) 15 MG tablet Take 15 mg by mouth daily.    . methocarbamol (ROBAXIN) 750 MG tablet TAKE 1 TABLET DAILY  2  . methylphenidate (RITALIN) 10 MG tablet Take 1 tablet (10 mg total) by mouth 2 (two) times daily. 60 tablet 0  . Vitamin D, Ergocalciferol, (DRISDOL) 50000 units CAPS capsule Take 1 capsule (50,000 Units total) by mouth every 7  (seven) days. 4 capsule 0   Current Facility-Administered Medications on File Prior to Visit  Medication Dose Route Frequency Provider Last Rate Last Dose  . gadopentetate dimeglumine (MAGNEVIST) injection 20 mL  20 mL Intravenous Once PRN Dohmeier, Porfirio Mylararmen, MD        PAST MEDICAL HISTORY: Past Medical History:  Diagnosis Date  . ADD (attention deficit disorder)   . Adult night terrors 03/29/2014  . Alcohol abuse   . Ankylosing spondylitis (HCC)   . Anxiety   . Asthma    EXERCISE INDUCED  RARELY  USES INHALER  . Back pain   . Depression   . Drug use   . Fatty liver   . Hypothyroidism   . IBS (irritable bowel syndrome)   . Joint pain   . Night terrors   . Sleep apnea    SLEEPS W/ MOUTH GUARD     PAST SURGICAL HISTORY: Past Surgical History:  Procedure Laterality Date  . KNEE SURGERY Left   . LUMBAR LAMINECTOMY/DECOMPRESSION MICRODISCECTOMY N/A 01/14/2017   Procedure: LUMBAR 3-4 DECOMPRESSION;  Surgeon: Estill Bambergumonski, Mark, MD;  Location: [facility] OR;  Service: Orthopedics;  Laterality: N/A;  LUMBAR 2-4 DECOMPRESSION / 2 HRS FOR CASE.  . TONSILLECTOMY AND ADENOIDECTOMY  1992  . WISDOM TOOTH EXTRACTION      SOCIAL HISTORY: Social History   Tobacco Use  . Smoking  status: Former Smoker    Years: 4.00    Types: Cigarettes    Last attempt to quit: 01/16/1986    Years since quitting: 31.1  . Smokeless tobacco: Never Used  Substance Use Topics  . Alcohol use: Yes    Alcohol/week: 0.0 oz    Comment: Two drinks daily  . Drug use: Yes    Types: Marijuana    FAMILY HISTORY: Family History  Problem Relation Age of Onset  . Aneurysm Mother   . Sudden death Mother   . Stroke Father   . Hypertension Father   . Thyroid disease Father   . Alcoholism Father     ROS: Review of Systems  Constitutional: Positive for weight loss.  Cardiovascular: Negative for chest pain.  Neurological: Negative for headaches.    PHYSICAL EXAM: Blood pressure (!) 142/79, pulse 68, temperature 98.4  F (36.9 C), temperature source Oral, height 6' (1.829 m), weight 243 lb (110.2 kg), SpO2 97 %. Body mass index is 32.96 kg/m. Physical Exam  Constitutional: He is oriented to person, place, and time. He appears well-developed and well-nourished.  Cardiovascular: Normal rate.  Pulmonary/Chest: Effort normal.  Musculoskeletal: Normal range of motion.  Neurological: He is oriented to person, place, and time.  Skin: Skin is warm and dry.  Psychiatric: He has a normal mood and affect. His behavior is normal.  Vitals reviewed.   RECENT LABS AND TESTS: BMET    Component Value Date/Time   NA 140 02/16/2017 1142   K 4.4 02/16/2017 1142   CL 103 02/16/2017 1142   CO2 20 02/16/2017 1142   GLUCOSE 93 02/16/2017 1142   GLUCOSE 124 (H) 01/09/2017 1231   BUN 18 02/16/2017 1142   CREATININE 0.91 02/16/2017 1142   CALCIUM 9.7 02/16/2017 1142   GFRNONAA 97 02/16/2017 1142   GFRAA 112 02/16/2017 1142   Lab Results  Component Value Date   HGBA1C 5.3 02/16/2017   Lab Results  Component Value Date   INSULIN 10.0 02/16/2017   CBC    Component Value Date/Time   WBC 5.5 01/09/2017 1231   RBC 5.50 01/09/2017 1231   HGB 15.2 01/09/2017 1231   HCT 45.9 01/09/2017 1231   PLT 173 01/09/2017 1231   MCV 83.5 01/09/2017 1231   MCH 27.6 01/09/2017 1231   MCHC 33.1 01/09/2017 1231   RDW 13.3 01/09/2017 1231   LYMPHSABS 2.0 01/09/2017 1231   MONOABS 0.4 01/09/2017 1231   EOSABS 0.2 01/09/2017 1231   BASOSABS 0.0 01/09/2017 1231   Iron/TIBC/Ferritin/ %Sat No results found for: IRON, TIBC, FERRITIN, IRONPCTSAT Lipid Panel     Component Value Date/Time   CHOL 130 02/16/2017 1142   TRIG 118 02/16/2017 1142   HDL 37 (L) 02/16/2017 1142   LDLCALC 69 02/16/2017 1142   Hepatic Function Panel     Component Value Date/Time   PROT 7.6 02/16/2017 1142   ALBUMIN 4.7 02/16/2017 1142   AST 45 (H) 02/16/2017 1142   ALT 78 (H) 02/16/2017 1142   ALKPHOS 64 02/16/2017 1142   BILITOT 0.7  02/16/2017 1142      Component Value Date/Time   TSH 3.430 02/16/2017 1142    ASSESSMENT AND PLAN: Elevated blood pressure reading  Class 1 obesity with serious comorbidity and body mass index (BMI) of 33.0 to 33.9 in adult, unspecified obesity type  PLAN:  Elevated Blood Pressure We discussed sodium restriction, working on healthy weight loss, and a regular exercise program as the means to achieve improved blood pressure  control. [name] agreed with this plan and agreed to follow up as directed. We will continue to monitor his blood pressure as well as his progress with the above lifestyle modifications. He will continue to work on diet and exercise and we will recheck blood pressure in 2 to 3 weeks.  We spent > than 50% of the 15 minute visit on the counseling as documented in the note.  Obesity [name] is currently in the action stage of change. As such, his goal is to continue with weight loss efforts He has agreed to follow the Category 3 plan + 300 calories [name] has been instructed to work up to a goal of 150 minutes of combined cardio and strengthening exercise per week for weight loss and overall health benefits. We discussed the following Behavioral Modification Strategies today: work on meal planning and easy cooking plans and travel eating strategies   [name] has agreed to follow up with our clinic in 2 to 3 weeks. He was informed of the importance of frequent follow up visits to maximize his success with intensive lifestyle modifications for his multiple health conditions.   OBESITY BEHAVIORAL INTERVENTION VISIT  Today's visit was # 3 out of 22.  Starting weight: 253 lbs Starting date: 02/16/17 Today's weight : 243 lbs  Today's date: 03/16/2017 Total lbs lost to date: 10 (Patients must lose 7 lbs in the first 6 months to continue with counseling)   ASK: We discussed the diagnosis of obesity with [name] today and [name] agreed to give [name] permission to  discuss obesity behavioral modification therapy today.  ASSESS: [name] has the diagnosis of obesity and his BMI today is 32.95 [name] is in the action stage of change   ADVISE: [name] was educated on the multiple health risks of obesity as well as the benefit of weight loss to improve his health. He was advised of the need for long term treatment and the importance of lifestyle modifications.  AGREE: Multiple dietary modification options and treatment options were discussed and  [name] agreed to the above obesity treatment plan.  I, Burt Knack, am acting as transcriptionist for Quillian Quince, MD  I have reviewed the above documentation for accuracy and completeness, and I agree with the above. -Quillian Quince, MD

## 2017-03-30 ENCOUNTER — Ambulatory Visit (INDEPENDENT_AMBULATORY_CARE_PROVIDER_SITE_OTHER): Payer: 59 | Admitting: Family Medicine

## 2017-03-30 ENCOUNTER — Encounter (INDEPENDENT_AMBULATORY_CARE_PROVIDER_SITE_OTHER): Payer: Self-pay | Admitting: Family Medicine

## 2017-03-30 VITALS — BP 114/72 | HR 59 | Temp 98.4°F | Ht 72.0 in | Wt 239.0 lb

## 2017-03-30 DIAGNOSIS — E559 Vitamin D deficiency, unspecified: Secondary | ICD-10-CM | POA: Diagnosis not present

## 2017-03-30 DIAGNOSIS — E669 Obesity, unspecified: Secondary | ICD-10-CM | POA: Diagnosis not present

## 2017-03-30 DIAGNOSIS — Z9189 Other specified personal risk factors, not elsewhere classified: Secondary | ICD-10-CM

## 2017-03-30 DIAGNOSIS — Z6832 Body mass index (BMI) 32.0-32.9, adult: Secondary | ICD-10-CM | POA: Diagnosis not present

## 2017-03-30 MED ORDER — VITAMIN D (ERGOCALCIFEROL) 1.25 MG (50000 UNIT) PO CAPS: 50000.0000 [IU] | ORAL_CAPSULE | ORAL | 0 refills | Status: DC

## 2017-03-30 NOTE — Progress Notes (Signed)
Office: [phone]  /  Fax: [phone]   HPI:   Chief Complaint: OBESITY [name] is here to discuss his progress with his obesity treatment plan. He is on the Category 3 plan + 300 calories and is following his eating plan approximately 95 % of the time. He states he is doing the elliptical for 28 minutes 3 times per week. [name] continues to do well with weight loss. He has increased exercise and notes a mildly increase in hunger since increasing exercise. He is comfortable with his eating plan and feel cravings are under control.  His weight is 239 lb (108.4 kg) today and has had a weight loss of 4 pounds over a period of 2 weeks since his last visit. He has lost 14 lbs since starting treatment with [name].  Vitamin D Deficiency [name] has a diagnosis of vitamin D deficiency. He is stable on prescription Vit D, not yet at goal but fatigue is improving. He denies nausea, vomiting or muscle weakness.  At risk for osteopenia and osteoporosis [name] is at higher risk of osteopenia and osteoporosis due to vitamin D deficiency.   ALLERGIES: No Known Allergies  MEDICATIONS: Current Outpatient Medications on File Prior to Visit  Medication Sig Dispense Refill  . cloNIDine (CATAPRES) 0.1 MG tablet Take 1 tablet (0.1 mg total) at bedtime by mouth. 30 tablet 11  . dexmethylphenidate (FOCALIN XR) 20 MG 24 hr capsule Take 1 capsule (20 mg total) by mouth daily. 30 capsule 0  . HUMIRA PEN 40 MG/0.8ML PNKT Inject 40 mg into the skin See admin instructions. Twice monthly (1st & 15th)  1  . levothyroxine (SYNTHROID, LEVOTHROID) 50 MCG tablet Take 75 mcg by mouth daily.   3  . meloxicam (MOBIC) 15 MG tablet Take 15 mg by mouth daily.    . methocarbamol (ROBAXIN) 750 MG tablet TAKE 1 TABLET DAILY  2  . methylphenidate (RITALIN) 10 MG tablet Take 1 tablet (10 mg total) by mouth 2 (two) times daily. 60 tablet 0   Current Facility-Administered Medications on File Prior to Visit  Medication Dose Route  Frequency Provider Last Rate Last Dose  . gadopentetate dimeglumine (MAGNEVIST) injection 20 mL  20 mL Intravenous Once PRN Dohmeier, Porfirio Mylar, MD        PAST MEDICAL HISTORY: Past Medical History:  Diagnosis Date  . ADD (attention deficit disorder)   . Adult night terrors 03/29/2014  . Alcohol abuse   . Ankylosing spondylitis (HCC)   . Anxiety   . Asthma    EXERCISE INDUCED  RARELY  USES INHALER  . Back pain   . Depression   . Drug use   . Fatty liver   . Hypothyroidism   . IBS (irritable bowel syndrome)   . Joint pain   . Night terrors   . Sleep apnea    SLEEPS W/ MOUTH GUARD     PAST SURGICAL HISTORY: Past Surgical History:  Procedure Laterality Date  . KNEE SURGERY Left   . LUMBAR LAMINECTOMY/DECOMPRESSION MICRODISCECTOMY N/A 01/14/2017   Procedure: LUMBAR 3-4 DECOMPRESSION;  Surgeon: Estill Bamberg, MD;  Location: [facility] OR;  Service: Orthopedics;  Laterality: N/A;  LUMBAR 2-4 DECOMPRESSION / 2 HRS FOR CASE.  . TONSILLECTOMY AND ADENOIDECTOMY  1992  . WISDOM TOOTH EXTRACTION      SOCIAL HISTORY: Social History   Tobacco Use  . Smoking status: Former Smoker    Years: 4.00    Types: Cigarettes    Last attempt to quit: 01/16/1986  Years since quitting: 31.2  . Smokeless tobacco: Never Used  Substance Use Topics  . Alcohol use: Yes    Alcohol/week: 0.0 oz    Comment: Two drinks daily  . Drug use: Yes    Types: Marijuana    FAMILY HISTORY: Family History  Problem Relation Age of Onset  . Aneurysm Mother   . Sudden death Mother   . Stroke Father   . Hypertension Father   . Thyroid disease Father   . Alcoholism Father     ROS: Review of Systems  Constitutional: Positive for malaise/fatigue and weight loss.  Gastrointestinal: Negative for nausea and vomiting.  Musculoskeletal:       Negative muscle weakness    PHYSICAL EXAM: Blood pressure 114/72, pulse (!) 59, temperature 98.4 F (36.9 C), temperature source Oral, height 6' (1.829 m), weight 239 lb  (108.4 kg), SpO2 97 %. Body mass index is 32.41 kg/m. Physical Exam  Constitutional: He is oriented to person, place, and time. He appears well-developed and well-nourished.  Cardiovascular: Normal rate.  Pulmonary/Chest: Effort normal.  Musculoskeletal: Normal range of motion.  Neurological: He is oriented to person, place, and time.  Skin: Skin is warm and dry.  Psychiatric: His behavior is normal.  Vitals reviewed.   RECENT LABS AND TESTS: BMET    Component Value Date/Time   NA 140 02/16/2017 1142   K 4.4 02/16/2017 1142   CL 103 02/16/2017 1142   CO2 20 02/16/2017 1142   GLUCOSE 93 02/16/2017 1142   GLUCOSE 124 (H) 01/09/2017 1231   BUN 18 02/16/2017 1142   CREATININE 0.91 02/16/2017 1142   CALCIUM 9.7 02/16/2017 1142   GFRNONAA 97 02/16/2017 1142   GFRAA 112 02/16/2017 1142   Lab Results  Component Value Date   HGBA1C 5.3 02/16/2017   Lab Results  Component Value Date   INSULIN 10.0 02/16/2017   CBC    Component Value Date/Time   WBC 5.5 01/09/2017 1231   RBC 5.50 01/09/2017 1231   HGB 15.2 01/09/2017 1231   HCT 45.9 01/09/2017 1231   PLT 173 01/09/2017 1231   MCV 83.5 01/09/2017 1231   MCH 27.6 01/09/2017 1231   MCHC 33.1 01/09/2017 1231   RDW 13.3 01/09/2017 1231   LYMPHSABS 2.0 01/09/2017 1231   MONOABS 0.4 01/09/2017 1231   EOSABS 0.2 01/09/2017 1231   BASOSABS 0.0 01/09/2017 1231   Iron/TIBC/Ferritin/ %Sat No results found for: IRON, TIBC, FERRITIN, IRONPCTSAT Lipid Panel     Component Value Date/Time   CHOL 130 02/16/2017 1142   TRIG 118 02/16/2017 1142   HDL 37 (L) 02/16/2017 1142   LDLCALC 69 02/16/2017 1142   Hepatic Function Panel     Component Value Date/Time   PROT 7.6 02/16/2017 1142   ALBUMIN 4.7 02/16/2017 1142   AST 45 (H) 02/16/2017 1142   ALT 78 (H) 02/16/2017 1142   ALKPHOS 64 02/16/2017 1142   BILITOT 0.7 02/16/2017 1142      Component Value Date/Time   TSH 3.430 02/16/2017 1142  Results for [name], [name] (MRN  [id number]) as of 03/30/2017 13:02  Ref. Range 02/16/2017 11:42  Vitamin D, 25-Hydroxy Latest Ref Range: 30.0 - 100.0 ng/mL 19.3 (L)    ASSESSMENT AND PLAN: Vitamin D deficiency - Plan: Vitamin D, Ergocalciferol, (DRISDOL) 50000 units CAPS capsule  At risk for osteoporosis  Class 1 obesity with serious comorbidity and body mass index (BMI) of 32.0 to 32.9 in adult, unspecified obesity type  PLAN:  Vitamin D Deficiency  [name] was informed that low vitamin D levels contributes to fatigue and are associated with obesity, breast, and colon cancer. [name] agrees to continue taking prescription Vit D @50 ,000 IU every week #4 and we will refill for 1 month. He will follow up for routine testing of vitamin D, at least 2-3 times per year. He was informed of the risk of over-replacement of vitamin D and agrees to not increase his dose unless he discusses this with [name] first. [name] agrees to follow up with our clinic in 3 weeks.  At risk for osteopenia and osteoporosis [name] is at risk for osteopenia and osteoporsis due to his vitamin D deficiency. He was encouraged to take his vitamin D and follow his higher calcium diet and increase strengthening exercise to help strengthen his bones and decrease his risk of osteopenia and osteoporosis.  Obesity [name] is currently in the action stage of change. As such, his goal is to continue with weight loss efforts He has agreed to follow the Category 3 plan + 300 calories [name] has been instructed to work up to a goal of 150 minutes of combined cardio and strengthening exercise per week for weight loss and overall health benefits. We discussed the following Behavioral Modification Strategies today: increasing lean protein intake, ways to avoid boredom eating, and better snacking choices   [name] has agreed to follow up with our clinic in 3 weeks. He was informed of the importance of frequent follow up visits to maximize his success with intensive lifestyle  modifications for his multiple health conditions.   OBESITY BEHAVIORAL INTERVENTION VISIT  Today's visit was # 4 out of 22.  Starting weight: 253 lbs Starting date: 02/16/17 Today's weight : 239 lbs  Today's date: 03/30/2017 Total lbs lost to date: 14 (Patients must lose 7 lbs in the first 6 months to continue with counseling)   ASK: We discussed the diagnosis of obesity with [name] today and [name] agreed to give [name] permission to discuss obesity behavioral modification therapy today.  ASSESS: [name] has the diagnosis of obesity and his BMI today is 32.41 [name] is in the action stage of change   ADVISE: [name] was educated on the multiple health risks of obesity as well as the benefit of weight loss to improve his health. He was advised of the need for long term treatment and the importance of lifestyle modifications.  AGREE: Multiple dietary modification options and treatment options were discussed and  [name] agreed to the above obesity treatment plan.  I, [name], am acting as transcriptionist for Joe Quincearen Dinari Stgermaine, MD  I have reviewed the above documentation for accuracy and completeness, and I agree with the above. -Joe Quincearen Alveta Quintela, MD

## 2017-04-20 ENCOUNTER — Ambulatory Visit (INDEPENDENT_AMBULATORY_CARE_PROVIDER_SITE_OTHER): Payer: 59 | Admitting: Family Medicine

## 2017-04-20 VITALS — BP 125/81 | HR 63 | Temp 97.9°F | Ht 72.0 in | Wt 237.0 lb

## 2017-04-20 DIAGNOSIS — Z9189 Other specified personal risk factors, not elsewhere classified: Secondary | ICD-10-CM

## 2017-04-20 DIAGNOSIS — Z6832 Body mass index (BMI) 32.0-32.9, adult: Secondary | ICD-10-CM

## 2017-04-20 DIAGNOSIS — E559 Vitamin D deficiency, unspecified: Secondary | ICD-10-CM

## 2017-04-20 DIAGNOSIS — E669 Obesity, unspecified: Secondary | ICD-10-CM | POA: Diagnosis not present

## 2017-04-20 MED ORDER — VITAMIN D (ERGOCALCIFEROL) 1.25 MG (50000 UNIT) PO CAPS: 50000.0000 [IU] | ORAL_CAPSULE | ORAL | 0 refills | Status: DC

## 2017-04-20 NOTE — Progress Notes (Signed)
Office: [phone]  /  Fax: [phone]   HPI:   Chief Complaint: OBESITY [name] is here to discuss his progress with his obesity treatment plan. He is on the Category 3 plan + 300 calories and is following his eating plan approximately 95 % of the time. He states he is doing cardio for 45 minutes 4 times per week. [name] continues to do well with weight loss and has increased his exercise. Hunger is mostly controlled, he has to eat out 2 to 3 times per week for his job but he is making good choices.  His weight is 237 lb (107.5 kg) today and has had a weight loss of 2 pounds over a period of 3 weeks since his last visit. He has lost 16 lbs since starting treatment with us.  Vitamin D Deficiency [name] has a diagnosis of vitamin D deficiency. He is doing well on prescription Vit D. He notes fatigue improved and denies nausea, vomiting or muscle weakness.  At risk for osteopenia and osteoporosis [name] is at higher risk of osteopenia and osteoporosis due to vitamin D deficiency.   ALLERGIES: No Known Allergies  MEDICATIONS: Current Outpatient Medications on File Prior to Visit  Medication Sig Dispense Refill  . cloNIDine (CATAPRES) 0.1 MG tablet Take 1 tablet (0.1 mg total) at bedtime by mouth. 30 tablet 11  . dexmethylphenidate (FOCALIN XR) 20 MG 24 hr capsule Take 1 capsule (20 mg total) by mouth daily. 30 capsule 0  . HUMIRA PEN 40 MG/0.8ML PNKT Inject 40 mg into the skin See admin instructions. Twice monthly (1st & 15th)  1  . levothyroxine (SYNTHROID, LEVOTHROID) 50 MCG tablet Take 75 mcg by mouth daily.   3  . meloxicam (MOBIC) 15 MG tablet Take 15 mg by mouth daily.    . methocarbamol (ROBAXIN) 750 MG tablet TAKE 1 TABLET DAILY  2  . methylphenidate (RITALIN) 10 MG tablet Take 1 tablet (10 mg total) by mouth 2 (two) times daily. 60 tablet 0   Current Facility-Administered Medications on File Prior to Visit  Medication Dose Route Frequency Provider Last Rate Last Dose  .  gadopentetate dimeglumine (MAGNEVIST) injection 20 mL  20 mL Intravenous Once PRN Dohmeier, Porfirio Mylararmen, MD        PAST MEDICAL HISTORY: Past Medical History:  Diagnosis Date  . ADD (attention deficit disorder)   . Adult night terrors 03/29/2014  . Alcohol abuse   . Ankylosing spondylitis (HCC)   . Anxiety   . Asthma    EXERCISE INDUCED  RARELY  USES INHALER  . Back pain   . Depression   . Drug use   . Fatty liver   . Hypothyroidism   . IBS (irritable bowel syndrome)   . Joint pain   . Night terrors   . Sleep apnea    SLEEPS W/ MOUTH GUARD     PAST SURGICAL HISTORY: Past Surgical History:  Procedure Laterality Date  . KNEE SURGERY Left   . LUMBAR LAMINECTOMY/DECOMPRESSION MICRODISCECTOMY N/A 01/14/2017   Procedure: LUMBAR 3-4 DECOMPRESSION;  Surgeon: Estill Bambergumonski, Mark, MD;  Location: [facility] OR;  Service: Orthopedics;  Laterality: N/A;  LUMBAR 2-4 DECOMPRESSION / 2 HRS FOR CASE.  . TONSILLECTOMY AND ADENOIDECTOMY  1992  . WISDOM TOOTH EXTRACTION      SOCIAL HISTORY: Social History   Tobacco Use  . Smoking status: Former Smoker    Years: 4.00    Types: Cigarettes    Last attempt to quit: 01/16/1986    Years since  quitting: 31.2  . Smokeless tobacco: Never Used  Substance Use Topics  . Alcohol use: Yes    Alcohol/week: 0.0 oz    Comment: Two drinks daily  . Drug use: Yes    Types: Marijuana    FAMILY HISTORY: Family History  Problem Relation Age of Onset  . Aneurysm Mother   . Sudden death Mother   . Stroke Father   . Hypertension Father   . Thyroid disease Father   . Alcoholism Father     ROS: Review of Systems  Constitutional: Positive for malaise/fatigue and weight loss.  Gastrointestinal: Negative for nausea and vomiting.  Musculoskeletal:       Negative muscle weakness    PHYSICAL EXAM: Blood pressure 125/81, pulse 63, temperature 97.9 F (36.6 C), temperature source Oral, height 6' (1.829 m), weight 237 lb (107.5 kg), SpO2 96 %. Body mass index is 32.14  kg/m. Physical Exam  Constitutional: He is oriented to person, place, and time. He appears well-developed and well-nourished.  Cardiovascular: Normal rate.  Pulmonary/Chest: Effort normal.  Musculoskeletal: Normal range of motion.  Neurological: He is oriented to person, place, and time.  Skin: Skin is warm and dry.  Psychiatric: He has a normal mood and affect. His behavior is normal.  Vitals reviewed.   RECENT LABS AND TESTS: BMET    Component Value Date/Time   NA 140 02/16/2017 1142   K 4.4 02/16/2017 1142   CL 103 02/16/2017 1142   CO2 20 02/16/2017 1142   GLUCOSE 93 02/16/2017 1142   GLUCOSE 124 (H) 01/09/2017 1231   BUN 18 02/16/2017 1142   CREATININE 0.91 02/16/2017 1142   CALCIUM 9.7 02/16/2017 1142   GFRNONAA 97 02/16/2017 1142   GFRAA 112 02/16/2017 1142   Lab Results  Component Value Date   HGBA1C 5.3 02/16/2017   Lab Results  Component Value Date   INSULIN 10.0 02/16/2017   CBC    Component Value Date/Time   WBC 5.5 01/09/2017 1231   RBC 5.50 01/09/2017 1231   HGB 15.2 01/09/2017 1231   HCT 45.9 01/09/2017 1231   PLT 173 01/09/2017 1231   MCV 83.5 01/09/2017 1231   MCH 27.6 01/09/2017 1231   MCHC 33.1 01/09/2017 1231   RDW 13.3 01/09/2017 1231   LYMPHSABS 2.0 01/09/2017 1231   MONOABS 0.4 01/09/2017 1231   EOSABS 0.2 01/09/2017 1231   BASOSABS 0.0 01/09/2017 1231   Iron/TIBC/Ferritin/ %Sat No results found for: IRON, TIBC, FERRITIN, IRONPCTSAT Lipid Panel     Component Value Date/Time   CHOL 130 02/16/2017 1142   TRIG 118 02/16/2017 1142   HDL 37 (L) 02/16/2017 1142   LDLCALC 69 02/16/2017 1142   Hepatic Function Panel     Component Value Date/Time   PROT 7.6 02/16/2017 1142   ALBUMIN 4.7 02/16/2017 1142   AST 45 (H) 02/16/2017 1142   ALT 78 (H) 02/16/2017 1142   ALKPHOS 64 02/16/2017 1142   BILITOT 0.7 02/16/2017 1142      Component Value Date/Time   TSH 3.430 02/16/2017 1142  Results for VALDIS, BEVILL (MRN [id number]) as of  04/20/2017 11:38  Ref. Range 02/16/2017 11:42  Vitamin D, 25-Hydroxy Latest Ref Range: 30.0 - 100.0 ng/mL 19.3 (L)    ASSESSMENT AND PLAN: Vitamin D deficiency - Plan: Vitamin D, Ergocalciferol, (DRISDOL) 50000 units CAPS capsule  At risk for osteoporosis  Class 1 obesity with serious comorbidity and body mass index (BMI) of 32.0 to 32.9 in adult, unspecified obesity type  PLAN:  Vitamin D Deficiency Kazden was informed that low vitamin D levels contributes to fatigue and are associated with obesity, breast, and colon cancer. Caiden agrees to continue taking prescription Vit D @50 ,000 IU every week #4 and we will refill for 1 month. He will follow up for routine testing of vitamin D, at least 2-3 times per year. He was informed of the risk of over-replacement of vitamin D and agrees to not increase his dose unless he discusses this with [name] first. We will recheck labs in 1 month and Willow agrees to follow up with our clinic in 3 to 4 weeks.  At risk for osteopenia and osteoporosis Demani is at risk for osteopenia and osteoporsis due to his vitamin D deficiency. He was encouraged to take his vitamin D and follow his higher calcium diet and increase strengthening exercise to help strengthen his bones and decrease his risk of osteopenia and osteoporosis.  Obesity Nilo is currently in the action stage of change. As such, his goal is to continue with weight loss efforts He has agreed to follow the Category 3 plan + 300 calories Koltan has been instructed to work up to a goal of 150 minutes of combined cardio and strengthening exercise per week for weight loss and overall health benefits. We discussed the following Behavioral Modification Strategies today: increasing lean protein intake, work on meal planning and easy cooking plans, and travel eating strategies   Eunice has agreed to follow up with our clinic in 3 to 4 weeks. He was informed of the importance of frequent follow up visits to  maximize his success with intensive lifestyle modifications for his multiple health conditions.   OBESITY BEHAVIORAL INTERVENTION VISIT  Today's visit was # 5 out of 22.  Starting weight: 253 lbs Starting date: 02/16/17 Today's weight : 237 lbs  Today's date: 04/20/2017 Total lbs lost to date: 16 (Patients must lose 7 lbs in the first 6 months to continue with counseling)   ASK: We discussed the diagnosis of obesity with Joeph J Farish today and Nate agreed to give [name] permission to discuss obesity behavioral modification therapy today.  ASSESS: Bernabe has the diagnosis of obesity and his BMI today is 32.14 Elyas is in the action stage of change   ADVISE: Gavynn was educated on the multiple health risks of obesity as well as the benefit of weight loss to improve his health. He was advised of the need for long term treatment and the importance of lifestyle modifications.  AGREE: Multiple dietary modification options and treatment options were discussed and  Madyx agreed to the above obesity treatment plan.  I, Burt Knack, am acting as transcriptionist for Quillian Quince, MD  I have reviewed the above documentation for accuracy and completeness, and I agree with the above. -Quillian Quince, MD

## 2017-04-27 ENCOUNTER — Encounter (INDEPENDENT_AMBULATORY_CARE_PROVIDER_SITE_OTHER): Payer: Self-pay | Admitting: Family Medicine

## 2017-05-18 ENCOUNTER — Ambulatory Visit (INDEPENDENT_AMBULATORY_CARE_PROVIDER_SITE_OTHER): Payer: 59 | Admitting: Family Medicine

## 2017-05-20 ENCOUNTER — Telehealth: Payer: Self-pay | Admitting: Neurology

## 2017-05-20 ENCOUNTER — Other Ambulatory Visit: Payer: Self-pay | Admitting: Neurology

## 2017-05-20 DIAGNOSIS — G4730 Sleep apnea, unspecified: Secondary | ICD-10-CM

## 2017-05-20 DIAGNOSIS — G4710 Hypersomnia, unspecified: Secondary | ICD-10-CM

## 2017-05-20 DIAGNOSIS — Z9989 Dependence on other enabling machines and devices: Secondary | ICD-10-CM

## 2017-05-20 DIAGNOSIS — G4733 Obstructive sleep apnea (adult) (pediatric): Secondary | ICD-10-CM

## 2017-05-20 DIAGNOSIS — F901 Attention-deficit hyperactivity disorder, predominantly hyperactive type: Secondary | ICD-10-CM

## 2017-05-20 MED ORDER — DEXMETHYLPHENIDATE HCL ER 20 MG PO CP24: 20.0000 mg | ORAL_CAPSULE | Freq: Every day | ORAL | 0 refills | Status: DC

## 2017-05-20 MED ORDER — METHYLPHENIDATE HCL 10 MG PO TABS: 10.0000 mg | ORAL_TABLET | Freq: Two times a day (BID) | ORAL | 0 refills | Status: DC
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
## 2017-05-20 NOTE — Telephone Encounter (Signed)
This refill has been sent to the pharmacy on file.

## 2017-05-20 NOTE — Telephone Encounter (Signed)
Pt is needing refills for Methylphenidate  and Dexmethylphenidate 20 mg  Please call pt at  [phone] if any questions.

## 2017-05-22 ENCOUNTER — Ambulatory Visit: Payer: 59 | Admitting: Nurse Practitioner

## 2017-05-22 ENCOUNTER — Other Ambulatory Visit (INDEPENDENT_AMBULATORY_CARE_PROVIDER_SITE_OTHER): Payer: Self-pay | Admitting: Family Medicine

## 2017-05-22 ENCOUNTER — Encounter: Payer: Self-pay | Admitting: Nurse Practitioner

## 2017-05-22 VITALS — BP 130/70 | HR 56 | Ht 72.0 in | Wt 241.8 lb

## 2017-05-22 DIAGNOSIS — F514 Sleep terrors [night terrors]: Secondary | ICD-10-CM | POA: Diagnosis not present

## 2017-05-22 DIAGNOSIS — E559 Vitamin D deficiency, unspecified: Secondary | ICD-10-CM

## 2017-05-22 NOTE — Patient Instructions (Signed)
Discussed with Dr. Vickey Huger No other medication options that she is aware of Suggest counselor or psychiatrist F/U prn

## 2017-05-25 ENCOUNTER — Ambulatory Visit (INDEPENDENT_AMBULATORY_CARE_PROVIDER_SITE_OTHER): Payer: 59 | Admitting: Family Medicine

## 2017-05-25 VITALS — BP 128/80 | HR 75 | Temp 97.6°F | Ht 72.0 in | Wt 234.0 lb

## 2017-05-25 DIAGNOSIS — Z9189 Other specified personal risk factors, not elsewhere classified: Secondary | ICD-10-CM | POA: Diagnosis not present

## 2017-05-25 DIAGNOSIS — Z6831 Body mass index (BMI) 31.0-31.9, adult: Secondary | ICD-10-CM

## 2017-05-25 DIAGNOSIS — E669 Obesity, unspecified: Secondary | ICD-10-CM

## 2017-05-25 DIAGNOSIS — E559 Vitamin D deficiency, unspecified: Secondary | ICD-10-CM

## 2017-05-25 MED ORDER — VITAMIN D (ERGOCALCIFEROL) 1.25 MG (50000 UNIT) PO CAPS: 50000.0000 [IU] | ORAL_CAPSULE | ORAL | 0 refills | Status: DC

## 2017-05-26 NOTE — Progress Notes (Signed)
Office: [phone]  /  Fax: [phone]   HPI:   Chief Complaint: OBESITY [name] is here to discuss his progress with his obesity treatment plan. He is on the Category 3 plan +300 calories and is following his eating plan approximately 90 % of the time. He states he is doing elliptical for 28 minutes 3 times per week. [name] did well with weight loss, even with increased traveling. His hunger is controlled overall. He has been using CBD oil with THC to help with sleep, but doesn't feel this has increased his appetite. His weight is 234 lb (106.1 kg) today and has had a weight loss of 3 pounds over a period of 5 weeks since his last visit. He has lost 19 lbs since starting treatment with [name].  Vitamin D deficiency [name] has a diagnosis of vitamin D deficiency. [name] is stable on vit D but he is not yet at goal. Fatigue is improving and denies nausea, vomiting or muscle weakness.  At risk for cardiovascular disease [name] is at a higher than average risk for cardiovascular disease due to obesity. He currently denies any chest pain.  ALLERGIES: No Known Allergies  MEDICATIONS: Current Outpatient Medications on File Prior to Visit  Medication Sig Dispense Refill  . dexmethylphenidate (FOCALIN XR) 20 MG 24 hr capsule Take 1 capsule (20 mg total) by mouth daily. 30 capsule 0  . HUMIRA PEN 40 MG/0.8ML PNKT Inject 40 mg into the skin See admin instructions. Twice monthly (1st & 15th)  1  . levothyroxine (SYNTHROID, LEVOTHROID) 50 MCG tablet Take 75 mcg by mouth daily.   3  . meloxicam (MOBIC) 15 MG tablet Take 15 mg by mouth daily.    . methocarbamol (ROBAXIN) 750 MG tablet TAKE 1 TABLET DAILY  2  . methylphenidate (RITALIN) 10 MG tablet Take 1 tablet (10 mg total) by mouth 2 (two) times daily. 60 tablet 0   Current Facility-Administered Medications on File Prior to Visit  Medication Dose Route Frequency Provider Last Rate Last Dose  . gadopentetate dimeglumine (MAGNEVIST) injection 20  mL  20 mL Intravenous Once PRN Dohmeier, Porfirio Mylar, MD        PAST MEDICAL HISTORY: Past Medical History:  Diagnosis Date  . ADD (attention deficit disorder)   . Adult night terrors 03/29/2014  . Alcohol abuse   . Ankylosing spondylitis (HCC)   . Anxiety   . Asthma    EXERCISE INDUCED  RARELY  USES INHALER  . Back pain   . Depression   . Drug use   . Fatty liver   . Hypothyroidism   . IBS (irritable bowel syndrome)   . Joint pain   . Night terrors   . Sleep apnea    SLEEPS W/ MOUTH GUARD     PAST SURGICAL HISTORY: Past Surgical History:  Procedure Laterality Date  . KNEE SURGERY Left   . LUMBAR LAMINECTOMY/DECOMPRESSION MICRODISCECTOMY N/A 01/14/2017   Procedure: LUMBAR 3-4 DECOMPRESSION;  Surgeon: Estill Bamberg, MD;  Location: [facility] OR;  Service: Orthopedics;  Laterality: N/A;  LUMBAR 2-4 DECOMPRESSION / 2 HRS FOR CASE.  . TONSILLECTOMY AND ADENOIDECTOMY  1992  . WISDOM TOOTH EXTRACTION      SOCIAL HISTORY: Social History   Tobacco Use  . Smoking status: Former Smoker    Years: 4.00    Types: Cigarettes    Last attempt to quit: 01/16/1986    Years since quitting: 31.3  . Smokeless tobacco: Never Used  Substance Use Topics  . Alcohol use:  Yes    Alcohol/week: 0.0 oz    Comment: Two drinks daily  . Drug use: Yes    Types: Marijuana    FAMILY HISTORY: Family History  Problem Relation Age of Onset  . Aneurysm Mother   . Sudden death Mother   . Stroke Father   . Hypertension Father   . Thyroid disease Father   . Alcoholism Father   . Aneurysm Other        brain    ROS: Review of Systems  Constitutional: Positive for malaise/fatigue and weight loss.  Cardiovascular: Negative for chest pain.  Gastrointestinal: Negative for nausea and vomiting.  Musculoskeletal:       Negative for muscle weakness    PHYSICAL EXAM: Blood pressure 128/80, pulse 75, temperature 97.6 F (36.4 C), temperature source Oral, height 6' (1.829 m), weight 234 lb (106.1 kg), SpO2 96  %. Body mass index is 31.74 kg/m. Physical Exam  Constitutional: He is oriented to person, place, and time. He appears well-developed and well-nourished.  Cardiovascular: Normal rate.  Pulmonary/Chest: Effort normal.  Musculoskeletal: Normal range of motion.  Neurological: He is oriented to person, place, and time.  Skin: Skin is warm and dry.  Psychiatric: He has a normal mood and affect. His behavior is normal.  Vitals reviewed.   RECENT LABS AND TESTS: BMET    Component Value Date/Time   NA 140 02/16/2017 1142   K 4.4 02/16/2017 1142   CL 103 02/16/2017 1142   CO2 20 02/16/2017 1142   GLUCOSE 93 02/16/2017 1142   GLUCOSE 124 (H) 01/09/2017 1231   BUN 18 02/16/2017 1142   CREATININE 0.91 02/16/2017 1142   CALCIUM 9.7 02/16/2017 1142   GFRNONAA 97 02/16/2017 1142   GFRAA 112 02/16/2017 1142   Lab Results  Component Value Date   HGBA1C 5.3 02/16/2017   Lab Results  Component Value Date   INSULIN 10.0 02/16/2017   CBC    Component Value Date/Time   WBC 5.5 01/09/2017 1231   RBC 5.50 01/09/2017 1231   HGB 15.2 01/09/2017 1231   HCT 45.9 01/09/2017 1231   PLT 173 01/09/2017 1231   MCV 83.5 01/09/2017 1231   MCH 27.6 01/09/2017 1231   MCHC 33.1 01/09/2017 1231   RDW 13.3 01/09/2017 1231   LYMPHSABS 2.0 01/09/2017 1231   MONOABS 0.4 01/09/2017 1231   EOSABS 0.2 01/09/2017 1231   BASOSABS 0.0 01/09/2017 1231   Iron/TIBC/Ferritin/ %Sat No results found for: IRON, TIBC, FERRITIN, IRONPCTSAT Lipid Panel     Component Value Date/Time   CHOL 130 02/16/2017 1142   TRIG 118 02/16/2017 1142   HDL 37 (L) 02/16/2017 1142   LDLCALC 69 02/16/2017 1142   Hepatic Function Panel     Component Value Date/Time   PROT 7.6 02/16/2017 1142   ALBUMIN 4.7 02/16/2017 1142   AST 45 (H) 02/16/2017 1142   ALT 78 (H) 02/16/2017 1142   ALKPHOS 64 02/16/2017 1142   BILITOT 0.7 02/16/2017 1142      Component Value Date/Time   TSH 3.430 02/16/2017 1142   Results for [name], [name] (MRN [id number]) as of 05/26/2017 15:51  Ref. Range 02/16/2017 11:42  Vitamin D, 25-Hydroxy Latest Ref Range: 30.0 - 100.0 ng/mL 19.3 (L)   ASSESSMENT AND PLAN: Vitamin D deficiency - Plan: Vitamin D, Ergocalciferol, (DRISDOL) 50000 units CAPS capsule  At risk for heart disease  Class 1 obesity with serious comorbidity and body mass index (BMI) of 31.0 to 31.9 in adult, unspecified  obesity type  PLAN:  Vitamin D Deficiency [name] was informed that low vitamin D levels contributes to fatigue and are associated with obesity, breast, and colon cancer. He agrees to continue to take prescription Vit D ,000 IU every week #4 with no refills and will follow up for routine testing of vitamin D, at least 2-3 times per year. He was informed of the risk of over-replacement of vitamin D and agrees to not increase his dose unless he discusses this with [name] first. [name] agrees to follow up with our clinic in 3 weeks.  Cardiovascular risk counseling [name] was given extended (15 minutes) coronary artery disease prevention counseling today. He is 52 y.o. male and has risk factors for heart disease including obesity. We discussed intensive lifestyle modifications today with an emphasis on specific weight loss instructions and strategies. Pt was also informed of the importance of increasing exercise and decreasing saturated fats to help prevent heart disease.  Obesity [name] is currently in the action stage of change. As such, his goal is to continue with weight loss efforts He has agreed to follow the Category 3 plan plus breakfast options +300 calories [name] has been instructed to work up to a goal of 150 minutes of combined cardio and strengthening exercise per week for weight loss and overall health benefits. We discussed the following Behavioral Modification Strategies today: increasing lean protein intake, decreasing simple carbohydrates  and work on meal planning and easy cooking plans  [name]  has agreed to follow up with our clinic in 3 weeks fasting. He was informed of the importance of frequent follow up visits to maximize his success with intensive lifestyle modifications for his multiple health conditions.   OBESITY BEHAVIORAL INTERVENTION VISIT  Today's visit was # 6 out of 22.  Starting weight: 253 lbs Starting date: 02/16/17 Today's weight : 234 lbs Today's date: 05/25/2017 Total lbs lost to date: 101 (Patients must lose 7 lbs in the first 6 months to continue with counseling)   ASK: We discussed the diagnosis of obesity with [name] today and [name] agreed to give [name] permission to discuss obesity behavioral modification therapy today.  ASSESS: [name] has the diagnosis of obesity and his BMI today is 31.73 [name] is in the action stage of change   ADVISE: [name] was educated on the multiple health risks of obesity as well as the benefit of weight loss to improve his health. He was advised of the need for long term treatment and the importance of lifestyle modifications.  AGREE: Multiple dietary modification options and treatment options were discussed and  D[place]oda agreed to the above obesity treatment plan.  I, [place] Crane, am acting as transcriptionist for Quillian Quince, MD  I have reviewed the above documentation for accuracy and completeness, and I agree with the above. -Quillian Quince, MD

## 2017-06-05 NOTE — Progress Notes (Signed)
I agree with the assessment and plan as directed by NP .The patient is known to me .   Joe Figgs, MD  

## 2017-06-22 ENCOUNTER — Ambulatory Visit (INDEPENDENT_AMBULATORY_CARE_PROVIDER_SITE_OTHER): Payer: 59 | Admitting: Family Medicine

## 2017-06-22 VITALS — BP 132/78 | HR 65 | Ht 72.0 in | Wt 230.0 lb

## 2017-06-22 DIAGNOSIS — R945 Abnormal results of liver function studies: Secondary | ICD-10-CM

## 2017-06-22 DIAGNOSIS — E038 Other specified hypothyroidism: Secondary | ICD-10-CM | POA: Diagnosis not present

## 2017-06-22 DIAGNOSIS — E559 Vitamin D deficiency, unspecified: Secondary | ICD-10-CM

## 2017-06-22 DIAGNOSIS — Z9189 Other specified personal risk factors, not elsewhere classified: Secondary | ICD-10-CM | POA: Diagnosis not present

## 2017-06-22 DIAGNOSIS — R7989 Other specified abnormal findings of blood chemistry: Secondary | ICD-10-CM

## 2017-06-22 DIAGNOSIS — Z6831 Body mass index (BMI) 31.0-31.9, adult: Secondary | ICD-10-CM

## 2017-06-22 DIAGNOSIS — E669 Obesity, unspecified: Secondary | ICD-10-CM

## 2017-06-22 MED ORDER — VITAMIN D (ERGOCALCIFEROL) 1.25 MG (50000 UNIT) PO CAPS: 50000.0000 [IU] | ORAL_CAPSULE | ORAL | 0 refills | Status: DC

## 2017-06-22 NOTE — Progress Notes (Signed)
Office: [phone]  /  Fax: [phone]   HPI:   Chief Complaint: OBESITY [name] is here to discuss his progress with his obesity treatment plan. He is on the Category 3 plan with breakfast options, plus 300 calories and is following his eating plan approximately 92.95 % of the time. He states he is doing the elliptical for 28 minutes 3 to 4 times per week. [name] continues to do well with weight loss on his category 3 diet prescription and exercise. Hunger is controlled and he feels well. His weight is 230 lb (104.3 kg) today and has had a weight loss of 4 pounds over a period of 4 weeks since his last visit. He has lost 23 lbs since starting treatment with [name].  Vitamin D deficiency [name] has a diagnosis of vitamin D deficiency. [name] is stable on vit D, but he is not yet at goal. [name] denies nausea, vomiting or muscle weakness.  At risk for osteopenia and osteoporosis [name] is at higher risk of osteopenia and osteoporosis due to vitamin D deficiency.   Elevated LFT [name] has a new dx of elevated ALT. He is working on diet and exercise. He denies abdominal pain or jaundice.   Hypothyroid [name] has a diagnosis of hypothyroidism. He is on levothyroxine 75 mcg daily. He denies hot or cold intolerance and he has a normal heart rate.  ALLERGIES: No Known Allergies  MEDICATIONS: Current Outpatient Medications on File Prior to Visit  Medication Sig Dispense Refill  . dexmethylphenidate (FOCALIN XR) 20 MG 24 hr capsule Take 1 capsule (20 mg total) by mouth daily. 30 capsule 0  . HUMIRA PEN 40 MG/0.8ML PNKT Inject 40 mg into the skin See admin instructions. Twice monthly (1st & 15th)  1  . levothyroxine (SYNTHROID, LEVOTHROID) 50 MCG tablet Take 75 mcg by mouth daily.   3  . meloxicam (MOBIC) 15 MG tablet Take 15 mg by mouth daily as needed.     . methocarbamol (ROBAXIN) 750 MG tablet TAKE 1 TABLET DAILY PRN  2  . methylphenidate (RITALIN) 10 MG tablet Take 1 tablet (10 mg  total) by mouth 2 (two) times daily. 60 tablet 0   Current Facility-Administered Medications on File Prior to Visit  Medication Dose Route Frequency Provider Last Rate Last Dose  . gadopentetate dimeglumine (MAGNEVIST) injection 20 mL  20 mL Intravenous Once PRN Dohmeier, Porfirio Mylar, MD        PAST MEDICAL HISTORY: Past Medical History:  Diagnosis Date  . ADD (attention deficit disorder)   . Adult night terrors 03/29/2014  . Alcohol abuse   . Ankylosing spondylitis (HCC)   . Anxiety   . Asthma    EXERCISE INDUCED  RARELY  USES INHALER  . Back pain   . Depression   . Drug use   . Fatty liver   . Hypothyroidism   . IBS (irritable bowel syndrome)   . Joint pain   . Night terrors   . Sleep apnea    SLEEPS W/ MOUTH GUARD     PAST SURGICAL HISTORY: Past Surgical History:  Procedure Laterality Date  . KNEE SURGERY Left   . LUMBAR LAMINECTOMY/DECOMPRESSION MICRODISCECTOMY N/A 01/14/2017   Procedure: LUMBAR 3-4 DECOMPRESSION;  Surgeon: Estill Bamberg, MD;  Location: [facility] OR;  Service: Orthopedics;  Laterality: N/A;  LUMBAR 2-4 DECOMPRESSION / 2 HRS FOR CASE.  . TONSILLECTOMY AND ADENOIDECTOMY  1992  . WISDOM TOOTH EXTRACTION      SOCIAL HISTORY: Social History   Tobacco Use  .  Smoking status: Former Smoker    Years: 4.00    Types: Cigarettes    Last attempt to quit: 01/16/1986    Years since quitting: 31.4  . Smokeless tobacco: Never Used  Substance Use Topics  . Alcohol use: Yes    Alcohol/week: 0.0 oz    Comment: Two drinks daily  . Drug use: Yes    Types: Marijuana    FAMILY HISTORY: Family History  Problem Relation Age of Onset  . Aneurysm Mother   . Sudden death Mother   . Stroke Father   . Hypertension Father   . Thyroid disease Father   . Alcoholism Father   . Aneurysm Other        brain    ROS: Review of Systems  Constitutional: Positive for weight loss.  Gastrointestinal: Negative for abdominal pain, nausea and vomiting.  Musculoskeletal:       Negative  for muscle weakness  Skin:       Negative for jaundice  Endo/Heme/Allergies:       Negative for hot or cold intolerance    PHYSICAL EXAM: Blood pressure 132/78, pulse 65, height 6' (1.829 m), weight 230 lb (104.3 kg), SpO2 99 %. Body mass index is 31.19 kg/m. Physical Exam  Constitutional: He is oriented to person, place, and time. He appears well-developed and well-nourished.  Eyes: No scleral icterus.  Cardiovascular: Normal rate.  Pulmonary/Chest: Effort normal.  Abdominal:  + obesity  Musculoskeletal: Normal range of motion.  Neurological: He is oriented to person, place, and time.  Skin: Skin is warm and dry.  Psychiatric: He has a normal mood and affect. His behavior is normal.  Vitals reviewed.   RECENT LABS AND TESTS: BMET    Component Value Date/Time   NA 140 02/16/2017 1142   K 4.4 02/16/2017 1142   CL 103 02/16/2017 1142   CO2 20 02/16/2017 1142   GLUCOSE 93 02/16/2017 1142   GLUCOSE 124 (H) 01/09/2017 1231   BUN 18 02/16/2017 1142   CREATININE 0.91 02/16/2017 1142   CALCIUM 9.7 02/16/2017 1142   GFRNONAA 97 02/16/2017 1142   GFRAA 112 02/16/2017 1142   Lab Results  Component Value Date   HGBA1C 5.3 02/16/2017   Lab Results  Component Value Date   INSULIN 10.0 02/16/2017   CBC    Component Value Date/Time   WBC 5.5 01/09/2017 1231   RBC 5.50 01/09/2017 1231   HGB 15.2 01/09/2017 1231   HCT 45.9 01/09/2017 1231   PLT 173 01/09/2017 1231   MCV 83.5 01/09/2017 1231   MCH 27.6 01/09/2017 1231   MCHC 33.1 01/09/2017 1231   RDW 13.3 01/09/2017 1231   LYMPHSABS 2.0 01/09/2017 1231   MONOABS 0.4 01/09/2017 1231   EOSABS 0.2 01/09/2017 1231   BASOSABS 0.0 01/09/2017 1231   Iron/TIBC/Ferritin/ %Sat No results found for: IRON, TIBC, FERRITIN, IRONPCTSAT Lipid Panel     Component Value Date/Time   CHOL 130 02/16/2017 1142   TRIG 118 02/16/2017 1142   HDL 37 (L) 02/16/2017 1142   LDLCALC 69 02/16/2017 1142   Hepatic Function Panel       Component Value Date/Time   PROT 7.6 02/16/2017 1142   ALBUMIN 4.7 02/16/2017 1142   AST 45 (H) 02/16/2017 1142   ALT 78 (H) 02/16/2017 1142   ALKPHOS 64 02/16/2017 1142   BILITOT 0.7 02/16/2017 1142      Component Value Date/Time   TSH 3.430 02/16/2017 1142   Results for ANDREI, MCCOOK (MRN [id number])  as of 06/22/2017 13:13  Ref. Range 02/16/2017 11:42  Vitamin D, 25-Hydroxy Latest Ref Range: 30.0 - 100.0 ng/mL 19.3 (L)   ASSESSMENT AND PLAN: Vitamin D deficiency - Plan: Vitamin B12, CBC With Differential, Comprehensive metabolic panel, Folate, Hemoglobin A1c, Insulin, random, VITAMIN D 25 Hydroxy (Vit-D Deficiency, Fractures), Vitamin D, Ergocalciferol, (DRISDOL) 50000 units CAPS capsule  Elevated LFTs - Plan: Lipid Panel With LDL/HDL Ratio  Other specified hypothyroidism - Plan: T3, T4, free, TSH  At risk for osteoporosis  Class 1 obesity with serious comorbidity and body mass index (BMI) of 31.0 to 31.9 in adult, unspecified obesity type  PLAN:  Vitamin D Deficiency [name] was informed that low vitamin D levels contributes to fatigue and are associated with obesity, breast, and colon cancer. He agrees to continue to take prescription Vit D @50 ,000 IU every week #4 with no refills. We will check labs and he will follow up for routine testing of vitamin D, at least 2-3 times per year. He was informed of the risk of over-replacement of vitamin D and agrees to not increase his dose unless he discusses this with [name] first. [name] agrees to follow up as directed.  At risk for osteopenia and osteoporosis [name] is at risk for osteopenia and osteoporosis due to his vitamin D deficiency. He was encouraged to take his vitamin D and follow his higher calcium diet and increase strengthening exercise to help strengthen his bones and decrease his risk of osteopenia and osteoporosis.  Elevated LFT [name] agreed to continue with his weight loss efforts with healthier diet and exercise as an  essential part of his treatment plan. We will check labs and [name] agrees to follow up as directed.  Hypothyroid [name] was informed of the importance of good thyroid control to help with weight loss efforts. He was also informed that supertheraputic thyroid levels are dangerous and will not improve weight loss results. We will check labs and follow. [name] will continue levothyroxine as prescribed and follow up as directed.  Obesity [name] is currently in the action stage of change. As such, his goal is to continue with weight loss efforts He has agreed to follow the Category 3 plan [name] has been instructed to work up to a goal of 150 minutes of combined cardio and strengthening exercise per week for weight loss and overall health benefits. We discussed the following Behavioral Modification Strategies today: increasing lean protein intake, decreasing simple carbohydrates  and work on meal planning and easy cooking plans  [name] has agreed to follow up with our clinic in 3 weeks. He was informed of the importance of frequent follow up visits to maximize his success with intensive lifestyle modifications for his multiple health conditions.   OBESITY BEHAVIORAL INTERVENTION VISIT  Today's visit was # 7 out of 22.  Starting weight: 253 lbs Starting date: 02/16/17 Today's weight : 230 lbs Today's date: 06/22/2017 Total lbs lost to date: 23 (Patients must lose 7 lbs in the first 6 months to continue with counseling)   ASK: We discussed the diagnosis of obesity with [name] today and [name] agreed to give [name] permission to discuss obesity behavioral modification therapy today.  ASSESS: [name] has the diagnosis of obesity and his BMI today is 31.19 [name] is in the action stage of change   ADVISE: [name] was educated on the multiple health risks of obesity as well as the benefit of weight loss to improve his health. He was advised of the need for long term  treatment and the  importance of lifestyle modifications.  AGREE: Multiple dietary modification options and treatment options were discussed and  Jhalil agreed to the above obesity treatment plan.  I, Joanne Murray, am acting as [place] Cranetranscriptionist for Quillian Quincearen Beasley, MD  I have reviewed the above documentation for accuracy and completeness, and I agree with the above. -Quillian Quincearen Beasley, MD

## 2017-06-23 LAB — LIPID PANEL WITH LDL/HDL RATIO
Cholesterol, Total: 162 mg/dL (ref 100–199)
HDL: 49 mg/dL (ref >39)
LDL CALC: 94 mg/dL (ref 0–99)
LDl/HDL Ratio: 1.9 ratio (ref 0.0–3.6)
Triglycerides: 93 mg/dL (ref 0–149)
VLDL Cholesterol Cal: 19 mg/dL (ref 5–40)

## 2017-06-23 LAB — COMPREHENSIVE METABOLIC PANEL
ALK PHOS: 55 IU/L (ref 39–117)
ALT: 35 IU/L (ref 0–44)
AST: 23 IU/L (ref 0–40)
Albumin/Globulin Ratio: 1.9 (ref 1.2–2.2)
Albumin: 4.7 g/dL (ref 3.5–5.5)
BUN/Creatinine Ratio: 14 (ref 9–20)
BUN: 14 mg/dL (ref 6–24)
Bilirubin Total: 0.9 mg/dL (ref 0.0–1.2)
CO2: 23 mmol/L (ref 20–29)
Calcium: 9.4 mg/dL (ref 8.7–10.2)
Chloride: 101 mmol/L (ref 96–106)
Creatinine, Ser: 1.01 mg/dL (ref 0.76–1.27)
GFR calc Af Amer: 98 mL/min/{1.73_m2} (ref >59)
GFR calc non Af Amer: 85 mL/min/{1.73_m2} (ref >59)
GLOBULIN, TOTAL: 2.5 g/dL (ref 1.5–4.5)
GLUCOSE: 95 mg/dL (ref 65–99)
Potassium: 4.5 mmol/L (ref 3.5–5.2)
SODIUM: 140 mmol/L (ref 134–144)
Total Protein: 7.2 g/dL (ref 6.0–8.5)

## 2017-06-23 LAB — VITAMIN B12: VITAMIN B 12: 351 pg/mL (ref 232–1245)

## 2017-06-23 LAB — CBC WITH DIFFERENTIAL
BASOS ABS: 0 10*3/uL (ref 0.0–0.2)
Basos: 0 %
EOS (ABSOLUTE): 0.1 10*3/uL (ref 0.0–0.4)
Eos: 2 %
Hematocrit: 46.5 % (ref 37.5–51.0)
Hemoglobin: 15.5 g/dL (ref 13.0–17.7)
IMMATURE GRANULOCYTES: 0 %
Immature Grans (Abs): 0 10*3/uL (ref 0.0–0.1)
Lymphocytes Absolute: 1.6 10*3/uL (ref 0.7–3.1)
Lymphs: 33 %
MCH: 27.9 pg (ref 26.6–33.0)
MCHC: 33.3 g/dL (ref 31.5–35.7)
MCV: 84 fL (ref 79–97)
MONOS ABS: 0.4 10*3/uL (ref 0.1–0.9)
Monocytes: 9 %
NEUTROS PCT: 56 %
Neutrophils Absolute: 2.7 10*3/uL (ref 1.4–7.0)
RBC: 5.55 x10E6/uL (ref 4.14–5.80)
RDW: 14.5 % (ref 12.3–15.4)
WBC: 4.7 10*3/uL (ref 3.4–10.8)

## 2017-06-23 LAB — INSULIN, RANDOM: INSULIN: 8 u[IU]/mL (ref 2.6–24.9)

## 2017-06-23 LAB — T3: T3, Total: 100 ng/dL (ref 71–180)

## 2017-06-23 LAB — HEMOGLOBIN A1C
ESTIMATED AVERAGE GLUCOSE: 100 mg/dL
HEMOGLOBIN A1C: 5.1 % (ref 4.8–5.6)

## 2017-06-23 LAB — VITAMIN D 25 HYDROXY (VIT D DEFICIENCY, FRACTURES): Vit D, 25-Hydroxy: 53 ng/mL (ref 30.0–100.0)

## 2017-06-23 LAB — FOLATE

## 2017-06-23 LAB — TSH: TSH: 4 u[IU]/mL (ref 0.450–4.500)

## 2017-06-23 LAB — T4, FREE: FREE T4: 1.44 ng/dL (ref 0.82–1.77)

## 2017-06-25 ENCOUNTER — Other Ambulatory Visit (INDEPENDENT_AMBULATORY_CARE_PROVIDER_SITE_OTHER): Payer: Self-pay | Admitting: Family Medicine

## 2017-06-25 DIAGNOSIS — E559 Vitamin D deficiency, unspecified: Secondary | ICD-10-CM

## 2017-07-13 ENCOUNTER — Telehealth: Payer: Self-pay | Admitting: Neurology

## 2017-07-13 ENCOUNTER — Ambulatory Visit (INDEPENDENT_AMBULATORY_CARE_PROVIDER_SITE_OTHER): Payer: 59 | Admitting: Family Medicine

## 2017-07-13 VITALS — BP 125/72 | HR 66 | Temp 97.8°F | Ht 72.0 in | Wt 236.0 lb

## 2017-07-13 DIAGNOSIS — E559 Vitamin D deficiency, unspecified: Secondary | ICD-10-CM | POA: Diagnosis not present

## 2017-07-13 DIAGNOSIS — R945 Abnormal results of liver function studies: Secondary | ICD-10-CM | POA: Diagnosis not present

## 2017-07-13 DIAGNOSIS — E669 Obesity, unspecified: Secondary | ICD-10-CM

## 2017-07-13 DIAGNOSIS — Z9189 Other specified personal risk factors, not elsewhere classified: Secondary | ICD-10-CM

## 2017-07-13 DIAGNOSIS — R7989 Other specified abnormal findings of blood chemistry: Secondary | ICD-10-CM

## 2017-07-13 DIAGNOSIS — Z6832 Body mass index (BMI) 32.0-32.9, adult: Secondary | ICD-10-CM

## 2017-07-13 MED ORDER — VITAMIN D (ERGOCALCIFEROL) 1.25 MG (50000 UNIT) PO CAPS: 50000.0000 [IU] | ORAL_CAPSULE | ORAL | 0 refills | Status: DC

## 2017-07-13 NOTE — Telephone Encounter (Signed)
Pt requesting refills for dexmethylphenidate (FOCALIN XR) 20 MG 24 hr capsule and methylphenidate (RITALIN) 10 MG tablet sent to [facility].

## 2017-07-13 NOTE — Telephone Encounter (Signed)
This cant be filled until closer to the 8th. I will send to the MD work in on wed and see if they will be willing to fill since the office will be closed on thur and Friday.

## 2017-07-13 NOTE — Progress Notes (Signed)
.   Office: [phone]  /  Fax: [phone]   HPI:   Chief Complaint: OBESITY [name] is here to discuss his progress with his obesity treatment plan. He is on the Category 3 plan and is following his eating plan approximately 90 % of the time. He states he is exercising on the elliptical and treadmill, and he is doing weights for 65 minutes 3 to 4 times per week. [name] reports drinking more alcohol over the last 3 weeks. He also reports an episode of simple carbs snacking, otherwise he has been attempting to stick with his diet plan. He is lifting weights more than normal as well. His weight is 236 lb (107 kg) today and has had a weight gain of 6 pounds over a period of 3 weeks since his last visit. He has lost 17 lbs since starting treatment with [name].  Vitamin D deficiency [name] has a diagnosis of vitamin D deficiency. His most recent level is at goal and he is at risk for over replacement. He is currently on prescription vit D and denies nausea, vomiting or muscle weakness.  At risk for osteopenia and osteoporosis [name] is at higher risk of osteopenia and osteoporosis due to vitamin D deficiency.   Elevated LFT [name] has a new dx of elevated ALT. His most recent level improved and is now resolved. He denies abdominal pain or jaundice.    ALLERGIES: No Known Allergies  MEDICATIONS: Current Outpatient Medications on File Prior to Visit  Medication Sig Dispense Refill  . dexmethylphenidate (FOCALIN XR) 20 MG 24 hr capsule Take 1 capsule (20 mg total) by mouth daily. 30 capsule 0  . HUMIRA PEN 40 MG/0.8ML PNKT Inject 40 mg into the skin See admin instructions. Twice monthly (1st & 15th)  1  . levothyroxine (SYNTHROID, LEVOTHROID) 50 MCG tablet Take 75 mcg by mouth daily.   3  . meloxicam (MOBIC) 15 MG tablet Take 15 mg by mouth daily as needed.     . methocarbamol (ROBAXIN) 750 MG tablet TAKE 1 TABLET DAILY PRN  2  . methylphenidate (RITALIN) 10 MG tablet Take 1 tablet (10 mg  total) by mouth 2 (two) times daily. 60 tablet 0   Current Facility-Administered Medications on File Prior to Visit  Medication Dose Route Frequency Provider Last Rate Last Dose  . gadopentetate dimeglumine (MAGNEVIST) injection 20 mL  20 mL Intravenous Once PRN Dohmeier, Porfirio Mylararmen, MD        PAST MEDICAL HISTORY: Past Medical History:  Diagnosis Date  . ADD (attention deficit disorder)   . Adult night terrors 03/29/2014  . Alcohol abuse   . Ankylosing spondylitis (HCC)   . Anxiety   . Asthma    EXERCISE INDUCED  RARELY  USES INHALER  . Back pain   . Depression   . Drug use   . Fatty liver   . Hypothyroidism   . IBS (irritable bowel syndrome)   . Joint pain   . Night terrors   . Sleep apnea    SLEEPS W/ MOUTH GUARD     PAST SURGICAL HISTORY: Past Surgical History:  Procedure Laterality Date  . KNEE SURGERY Left   . LUMBAR LAMINECTOMY/DECOMPRESSION MICRODISCECTOMY N/A 01/14/2017   Procedure: LUMBAR 3-4 DECOMPRESSION;  Surgeon: Estill Bambergumonski, Mark, MD;  Location: [facility] OR;  Service: Orthopedics;  Laterality: N/A;  LUMBAR 2-4 DECOMPRESSION / 2 HRS FOR CASE.  . TONSILLECTOMY AND ADENOIDECTOMY  1992  . WISDOM TOOTH EXTRACTION      SOCIAL HISTORY: Social History  Tobacco Use  . Smoking status: Former Smoker    Years: 4.00    Types: Cigarettes    Last attempt to quit: 01/16/1986    Years since quitting: 31.5  . Smokeless tobacco: Never Used  Substance Use Topics  . Alcohol use: Yes    Alcohol/week: 0.0 oz    Comment: Two drinks daily  . Drug use: Yes    Types: Marijuana    FAMILY HISTORY: Family History  Problem Relation Age of Onset  . Aneurysm Mother   . Sudden death Mother   . Stroke Father   . Hypertension Father   . Thyroid disease Father   . Alcoholism Father   . Aneurysm Other        brain    ROS: Review of Systems  Constitutional: Negative for weight loss.  Gastrointestinal: Negative for abdominal pain, nausea and vomiting.  Musculoskeletal:       Negative  for muscle weakness  Skin:       Negative for jaundice    PHYSICAL EXAM: Blood pressure 125/72, pulse 66, temperature 97.8 F (36.6 C), temperature source Oral, height 6' (1.829 m), weight 236 lb (107 kg), SpO2 99 %. Body mass index is 32.01 kg/m. Physical Exam  Constitutional: He is oriented to person, place, and time. He appears well-developed and well-nourished.  Cardiovascular: Normal rate.  Pulmonary/Chest: Effort normal.  Musculoskeletal: Normal range of motion.  Neurological: He is oriented to person, place, and time.  Skin: Skin is warm and dry.  Psychiatric: He has a normal mood and affect.  Vitals reviewed.   RECENT LABS AND TESTS: BMET    Component Value Date/Time   NA 140 06/22/2017 1048   K 4.5 06/22/2017 1048   CL 101 06/22/2017 1048   CO2 23 06/22/2017 1048   GLUCOSE 95 06/22/2017 1048   GLUCOSE 124 (H) 01/09/2017 1231   BUN 14 06/22/2017 1048   CREATININE 1.01 06/22/2017 1048   CALCIUM 9.4 06/22/2017 1048   GFRNONAA 85 06/22/2017 1048   GFRAA 98 06/22/2017 1048   Lab Results  Component Value Date   HGBA1C 5.1 06/22/2017   HGBA1C 5.3 02/16/2017   Lab Results  Component Value Date   INSULIN 8.0 06/22/2017   INSULIN 10.0 02/16/2017   CBC    Component Value Date/Time   WBC 4.7 06/22/2017 1048   WBC 5.5 01/09/2017 1231   RBC 5.55 06/22/2017 1048   RBC 5.50 01/09/2017 1231   HGB 15.5 06/22/2017 1048   HCT 46.5 06/22/2017 1048   PLT 173 01/09/2017 1231   MCV 84 06/22/2017 1048   MCH 27.9 06/22/2017 1048   MCH 27.6 01/09/2017 1231   MCHC 33.3 06/22/2017 1048   MCHC 33.1 01/09/2017 1231   RDW 14.5 06/22/2017 1048   LYMPHSABS 1.6 06/22/2017 1048   MONOABS 0.4 01/09/2017 1231   EOSABS 0.1 06/22/2017 1048   BASOSABS 0.0 06/22/2017 1048   Iron/TIBC/Ferritin/ %Sat No results found for: IRON, TIBC, FERRITIN, IRONPCTSAT Lipid Panel     Component Value Date/Time   CHOL 162 06/22/2017 1048   TRIG 93 06/22/2017 1048   HDL 49 06/22/2017 1048    LDLCALC 94 06/22/2017 1048   Hepatic Function Panel     Component Value Date/Time   PROT 7.2 06/22/2017 1048   ALBUMIN 4.7 06/22/2017 1048   AST 23 06/22/2017 1048   ALT 35 06/22/2017 1048   ALKPHOS 55 06/22/2017 1048   BILITOT 0.9 06/22/2017 1048      Component Value Date/Time  TSH 4.000 06/22/2017 1048   TSH 3.430 02/16/2017 1142   Results for KALUB, MORILLO (MRN [id number]) as of 07/13/2017 12:02  Ref. Range 06/22/2017 10:48  Vitamin D, 25-Hydroxy Latest Ref Range: 30.0 - 100.0 ng/mL 53.0   ASSESSMENT AND PLAN: Vitamin D deficiency - Plan: Vitamin D, Ergocalciferol, (DRISDOL) 50000 units CAPS capsule  Elevated LFTs  At risk for osteoporosis  Class 1 obesity with serious comorbidity and body mass index (BMI) of 32.0 to 32.9 in adult, unspecified obesity type  PLAN:  Vitamin D Deficiency [name] was informed that low vitamin D levels contributes to fatigue and are associated with obesity, breast, and colon cancer. He agrees to continue to take prescription Vit D @50 ,000 IU every week #4 with no refills and will follow up for routine testing of vitamin D, at least 2-3 times per year. He was informed of the risk of over-replacement of vitamin D and agrees to not increase his dose unless he discusses this with [name] first. [name] agrees to follow up with our clinic as directed.  At risk for osteopenia and osteoporosis [name] is at risk for osteopenia and osteoporosis due to his vitamin D deficiency. He was encouraged to take his vitamin D and follow his higher calcium diet and increase strengthening exercise to help strengthen his bones and decrease his risk of osteopenia and osteoporosis.  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 [name] was educated on his risk of developing NASH or even liver failure and the only proven treatment for NAFLD was weight loss. [name] agreed to continue with his  weight loss efforts with healthier diet and exercise as an essential part of his treatment plan.  Obesity [name] is currently in the action stage of change. As such, his goal is to continue with weight loss efforts He has agreed to follow the Category 3 plan [name] has been instructed to work up to a goal of 150 minutes of combined cardio and strengthening exercise per week for weight loss and overall health benefits. We discussed the following Behavioral Modification Strategies today: increase H2O intake, decreasing simple carbohydrate snacking  and decrease ETOH  [name] has agreed to follow up with our clinic in 3 to 4 weeks. He was informed of the importance of frequent follow up visits to maximize his success with intensive lifestyle modifications for his multiple health conditions.   OBESITY BEHAVIORAL INTERVENTION VISIT  Today's visit was # 8 out of 22.  Starting weight: 253 lbs Starting date: 02/16/17 Today's weight : 236 lbs Today's date: 07/13/2017 Total lbs lost to date: 17 (Patients must lose 7 lbs in the first 6 months to continue with counseling)   ASK: We discussed the diagnosis of obesity with [name] today and [name] agreed to give [name] permission to discuss obesity behavioral modification therapy today.  ASSESS: [name] has the diagnosis of obesity and his BMI today is 34 [name] is in the action stage of change   ADVISE: [name] was educated on the multiple health risks of obesity as well as the benefit of weight loss to improve his health. He was advised of the need for long term treatment and the importance of lifestyle modifications.  AGREE: Multiple dietary modification options and treatment options were discussed and  [name] agreed to the above obesity treatment plan.  [name], am acting as transcriptionist for Quillian Quince, MD  I have reviewed the above documentation  for accuracy and completeness, and I agree with the above. -Quillian Quince,  MD

## 2017-07-15 ENCOUNTER — Other Ambulatory Visit: Payer: Self-pay | Admitting: Neurology

## 2017-07-15 DIAGNOSIS — G4733 Obstructive sleep apnea (adult) (pediatric): Secondary | ICD-10-CM

## 2017-07-15 DIAGNOSIS — F901 Attention-deficit hyperactivity disorder, predominantly hyperactive type: Secondary | ICD-10-CM

## 2017-07-15 DIAGNOSIS — G4730 Sleep apnea, unspecified: Principal | ICD-10-CM

## 2017-07-15 DIAGNOSIS — G4710 Hypersomnia, unspecified: Secondary | ICD-10-CM

## 2017-07-15 DIAGNOSIS — Z9989 Dependence on other enabling machines and devices: Secondary | ICD-10-CM

## 2017-07-15 MED ORDER — DEXMETHYLPHENIDATE HCL ER 20 MG PO CP24: 20.0000 mg | ORAL_CAPSULE | Freq: Every day | ORAL | 0 refills | Status: DC

## 2017-07-15 MED ORDER — METHYLPHENIDATE HCL 10 MG PO TABS: 10.0000 mg | ORAL_TABLET | Freq: Two times a day (BID) | ORAL | 0 refills | Status: DC

## 2017-08-09 ENCOUNTER — Other Ambulatory Visit (INDEPENDENT_AMBULATORY_CARE_PROVIDER_SITE_OTHER): Payer: Self-pay | Admitting: Family Medicine

## 2017-08-09 DIAGNOSIS — E559 Vitamin D deficiency, unspecified: Secondary | ICD-10-CM

## 2017-08-10 ENCOUNTER — Ambulatory Visit (INDEPENDENT_AMBULATORY_CARE_PROVIDER_SITE_OTHER): Payer: 59 | Admitting: Family Medicine

## 2017-08-10 VITALS — BP 117/70 | HR 51 | Temp 97.5°F | Ht 72.0 in | Wt 232.0 lb

## 2017-08-10 DIAGNOSIS — Z9189 Other specified personal risk factors, not elsewhere classified: Secondary | ICD-10-CM

## 2017-08-10 DIAGNOSIS — Z6831 Body mass index (BMI) 31.0-31.9, adult: Secondary | ICD-10-CM | POA: Diagnosis not present

## 2017-08-10 DIAGNOSIS — E669 Obesity, unspecified: Secondary | ICD-10-CM | POA: Diagnosis not present

## 2017-08-10 DIAGNOSIS — E559 Vitamin D deficiency, unspecified: Secondary | ICD-10-CM

## 2017-08-10 MED ORDER — VITAMIN D (ERGOCALCIFEROL) 1.25 MG (50000 UNIT) PO CAPS: 50000.0000 [IU] | ORAL_CAPSULE | ORAL | 0 refills | Status: DC

## 2017-08-11 NOTE — Progress Notes (Signed)
Office: [phone]  /  Fax: [phone]   HPI:   Chief Complaint: OBESITY [name] is here to discuss his progress with his obesity treatment plan. He is on the Category 3 plan and is following his eating plan approximately 90 % of the time. He states he is doing cardio and weights for 60 minutes 3 to 4 times per week. [name] continues to do well with diet and exercise. His fat percentage continues to decrease as he is increasing muscle mass with his strengthening exercise. He feels well overall. His weight is 232 lb (105.2 kg) today and has had a weight loss of 4 pounds over a period of 4 weeks since his last visit. He has lost 21 lbs since starting treatment with [name].  Vitamin D deficiency [name] has a diagnosis of vitamin D deficiency. He is stable on vit D and is now at goal. He denies nausea, vomiting or muscle weakness.  At risk for cardiovascular disease [name] is at a higher than average risk for cardiovascular disease due to obesity. He currently denies any chest pain.  ALLERGIES: No Known Allergies  MEDICATIONS: Current Outpatient Medications on File Prior to Visit  Medication Sig Dispense Refill  . dexmethylphenidate (FOCALIN XR) 20 MG 24 hr capsule Take 1 capsule (20 mg total) by mouth daily. 30 capsule 0  . HUMIRA PEN 40 MG/0.8ML PNKT Inject 40 mg into the skin See admin instructions. Twice monthly (1st & 15th)  1  . levothyroxine (SYNTHROID, LEVOTHROID) 50 MCG tablet Take 75 mcg by mouth daily.   3  . meloxicam (MOBIC) 15 MG tablet Take 15 mg by mouth daily as needed.     . methocarbamol (ROBAXIN) 750 MG tablet TAKE 1 TABLET DAILY PRN  2  . methylphenidate (RITALIN) 10 MG tablet Take 1 tablet (10 mg total) by mouth 2 (two) times daily. 60 tablet 0   Current Facility-Administered Medications on File Prior to Visit  Medication Dose Route Frequency Provider Last Rate Last Dose  . gadopentetate dimeglumine (MAGNEVIST) injection 20 mL  20 mL Intravenous Once PRN Dohmeier,  Porfirio Mylar, MD        PAST MEDICAL HISTORY: Past Medical History:  Diagnosis Date  . ADD (attention deficit disorder)   . Adult night terrors 03/29/2014  . Alcohol abuse   . Ankylosing spondylitis (HCC)   . Anxiety   . Asthma    EXERCISE INDUCED  RARELY  USES INHALER  . Back pain   . Depression   . Drug use   . Fatty liver   . Hypothyroidism   . IBS (irritable bowel syndrome)   . Joint pain   . Night terrors   . Sleep apnea    SLEEPS W/ MOUTH GUARD     PAST SURGICAL HISTORY: Past Surgical History:  Procedure Laterality Date  . KNEE SURGERY Left   . LUMBAR LAMINECTOMY/DECOMPRESSION MICRODISCECTOMY N/A 01/14/2017   Procedure: LUMBAR 3-4 DECOMPRESSION;  Surgeon: Estill Bamberg, MD;  Location: [facility] OR;  Service: Orthopedics;  Laterality: N/A;  LUMBAR 2-4 DECOMPRESSION / 2 HRS FOR CASE.  . TONSILLECTOMY AND ADENOIDECTOMY  1992  . WISDOM TOOTH EXTRACTION      SOCIAL HISTORY: Social History   Tobacco Use  . Smoking status: Former Smoker    Years: 4.00    Types: Cigarettes    Last attempt to quit: 01/16/1986    Years since quitting: 31.5  . Smokeless tobacco: Never Used  Substance Use Topics  . Alcohol use: Yes    Alcohol/week:  0.0 oz    Comment: Two drinks daily  . Drug use: Yes    Types: Marijuana    FAMILY HISTORY: Family History  Problem Relation Age of Onset  . Aneurysm Mother   . Sudden death Mother   . Stroke Father   . Hypertension Father   . Thyroid disease Father   . Alcoholism Father   . Aneurysm Other        brain    ROS: Review of Systems  Constitutional: Positive for weight loss.  Cardiovascular: Negative for chest pain.  Gastrointestinal: Negative for nausea and vomiting.  Musculoskeletal:       Negative for muscle weakness    PHYSICAL EXAM: Blood pressure 117/70, pulse (!) 51, temperature (!) 97.5 F (36.4 C), temperature source Oral, height 6' (1.829 m), weight 232 lb (105.2 kg), SpO2 98 %. Body mass index is 31.46 kg/m. Physical Exam    Constitutional: He is oriented to person, place, and time. He appears well-developed and well-nourished.  Cardiovascular: Normal rate.  Pulmonary/Chest: Effort normal.  Musculoskeletal: Normal range of motion.  Neurological: He is oriented to person, place, and time.  Skin: Skin is warm and dry.  Psychiatric: He has a normal mood and affect. His behavior is normal.  Vitals reviewed.   RECENT LABS AND TESTS: BMET    Component Value Date/Time   NA 140 06/22/2017 1048   K 4.5 06/22/2017 1048   CL 101 06/22/2017 1048   CO2 23 06/22/2017 1048   GLUCOSE 95 06/22/2017 1048   GLUCOSE 124 (H) 01/09/2017 1231   BUN 14 06/22/2017 1048   CREATININE 1.01 06/22/2017 1048   CALCIUM 9.4 06/22/2017 1048   GFRNONAA 85 06/22/2017 1048   GFRAA 98 06/22/2017 1048   Lab Results  Component Value Date   HGBA1C 5.1 06/22/2017   HGBA1C 5.3 02/16/2017   Lab Results  Component Value Date   INSULIN 8.0 06/22/2017   INSULIN 10.0 02/16/2017   CBC    Component Value Date/Time   WBC 4.7 06/22/2017 1048   WBC 5.5 01/09/2017 1231   RBC 5.55 06/22/2017 1048   RBC 5.50 01/09/2017 1231   HGB 15.5 06/22/2017 1048   HCT 46.5 06/22/2017 1048   PLT 173 01/09/2017 1231   MCV 84 06/22/2017 1048   MCH 27.9 06/22/2017 1048   MCH 27.6 01/09/2017 1231   MCHC 33.3 06/22/2017 1048   MCHC 33.1 01/09/2017 1231   RDW 14.5 06/22/2017 1048   LYMPHSABS 1.6 06/22/2017 1048   MONOABS 0.4 01/09/2017 1231   EOSABS 0.1 06/22/2017 1048   BASOSABS 0.0 06/22/2017 1048   Iron/TIBC/Ferritin/ %Sat No results found for: IRON, TIBC, FERRITIN, IRONPCTSAT Lipid Panel     Component Value Date/Time   CHOL 162 06/22/2017 1048   TRIG 93 06/22/2017 1048   HDL 49 06/22/2017 1048   LDLCALC 94 06/22/2017 1048   Hepatic Function Panel     Component Value Date/Time   PROT 7.2 06/22/2017 1048   ALBUMIN 4.7 06/22/2017 1048   AST 23 06/22/2017 1048   ALT 35 06/22/2017 1048   ALKPHOS 55 06/22/2017 1048   BILITOT 0.9  06/22/2017 1048      Component Value Date/Time   TSH 4.000 06/22/2017 1048   TSH 3.430 02/16/2017 1142   Results for [name], [name] (MRN [id number]) as of 08/11/2017 09:06  Ref. Range 06/22/2017 10:48  Vitamin D, 25-Hydroxy Latest Ref Range: 30.0 - 100.0 ng/mL 53.0   ASSESSMENT AND PLAN: Vitamin D deficiency - Plan: Vitamin  D, Ergocalciferol, (DRISDOL) 50000 units CAPS capsule  At risk for heart disease  Class 1 obesity with serious comorbidity and body mass index (BMI) of 31.0 to 31.9 in adult, unspecified obesity type  PLAN:  Vitamin D Deficiency Viren was informed that low vitamin D levels contributes to fatigue and are associated with obesity, breast, and colon cancer. He agrees to continue to take prescription Vit D @50 ,000 IU every week #4 with no refills and will follow up for routine testing of vitamin D, at least 2-3 times per year. He was informed of the risk of over-replacement of vitamin D and agrees to not increase his dose unless he discusses this with [name] first. We will recheck labs in 2 months.  Cardiovascular risk counseling Tarry was given extended (15 minutes) coronary artery disease prevention counseling today. He is 52 y.o. male and has risk factors for heart disease including obesity. We discussed intensive lifestyle modifications today with an emphasis on specific weight loss instructions and strategies. Pt was also informed of the importance of increasing exercise and decreasing saturated fats to help prevent heart disease.  Obesity Eban is currently in the action stage of change. As such, his goal is to continue with weight loss efforts He has agreed to follow the Category 3 plan Bereket has been instructed to work up to a goal of 150 minutes of combined cardio and strengthening exercise per week for weight loss and overall health benefits. We discussed the following Behavioral Modification Strategies today: increasing lean protein intake, work on meal  planning and easy cooking plans and dealing with family or coworker sabotage  Kimmie has agreed to follow up with our clinic in 4 weeks. He was informed of the importance of frequent follow up visits to maximize his success with intensive lifestyle modifications for his multiple health conditions.   OBESITY BEHAVIORAL INTERVENTION VISIT  Today's visit was # 9 out of 22.  Starting weight: 253 lbs Starting date: 02/16/17 Today's weight : 232 lbs Today's date: 08/10/2017 Total lbs lost to date: 21    ASK: We discussed the diagnosis of obesity with Mahdi Harrison Piano today and Vihan agreed to give [name] permission to discuss obesity behavioral modification therapy today.  ASSESS: Arvo has the diagnosis of obesity and his BMI today is 31.46 Krew is in the action stage of change   ADVISE: Delos was educated on the multiple health risks of obesity as well as the benefit of weight loss to improve his health. He was advised of the need for long term treatment and the importance of lifestyle modifications.  AGREE: Multiple dietary modification options and treatment options were discussed and  Sulayman agreed to the above obesity treatment plan.  I, [place] Crane, am acting as transcriptionist for Quillian Quince, MD  I have reviewed the above documentation for accuracy and completeness, and I agree with the above. -Quillian Quince, MD

## 2017-08-26 ENCOUNTER — Other Ambulatory Visit: Payer: Self-pay | Admitting: Nurse Practitioner

## 2017-08-26 DIAGNOSIS — G4710 Hypersomnia, unspecified: Secondary | ICD-10-CM

## 2017-08-26 DIAGNOSIS — G4730 Sleep apnea, unspecified: Principal | ICD-10-CM

## 2017-08-26 DIAGNOSIS — F901 Attention-deficit hyperactivity disorder, predominantly hyperactive type: Secondary | ICD-10-CM

## 2017-08-26 DIAGNOSIS — G4733 Obstructive sleep apnea (adult) (pediatric): Secondary | ICD-10-CM

## 2017-08-26 DIAGNOSIS — Z9989 Dependence on other enabling machines and devices: Secondary | ICD-10-CM

## 2017-08-26 MED ORDER — METHYLPHENIDATE HCL 10 MG PO TABS: 10.0000 mg | ORAL_TABLET | Freq: Two times a day (BID) | ORAL | 0 refills | Status: DC

## 2017-08-26 NOTE — Telephone Encounter (Signed)
Last filled 07-20-17 for 60 tabs in drug registry.

## 2017-08-26 NOTE — Telephone Encounter (Signed)
Pt requesting a refill for methylphenidate (RITALIN) 10 MG tablet sent to [facility]. (Pt unable to r/s appt at this time)

## 2017-09-07 ENCOUNTER — Ambulatory Visit (INDEPENDENT_AMBULATORY_CARE_PROVIDER_SITE_OTHER): Payer: 59 | Admitting: Family Medicine

## 2017-09-07 VITALS — BP 126/77 | HR 57 | Temp 97.6°F | Ht 72.0 in | Wt 234.0 lb

## 2017-09-07 DIAGNOSIS — Z9189 Other specified personal risk factors, not elsewhere classified: Secondary | ICD-10-CM

## 2017-09-07 DIAGNOSIS — E559 Vitamin D deficiency, unspecified: Secondary | ICD-10-CM | POA: Diagnosis not present

## 2017-09-07 DIAGNOSIS — Z6831 Body mass index (BMI) 31.0-31.9, adult: Secondary | ICD-10-CM | POA: Diagnosis not present

## 2017-09-07 DIAGNOSIS — E669 Obesity, unspecified: Secondary | ICD-10-CM

## 2017-09-07 MED ORDER — VITAMIN D (ERGOCALCIFEROL) 1.25 MG (50000 UNIT) PO CAPS: 50000.0000 [IU] | ORAL_CAPSULE | ORAL | 0 refills | Status: DC

## 2017-09-07 NOTE — Progress Notes (Signed)
Office: [phone]  /  Fax: [phone]   HPI:   Chief Complaint: OBESITY [name] is here to discuss his progress with his obesity treatment plan. He is on the Category 3 plan and is following his eating plan approximately 85 % of the time. He states he is lifting weights and doing cardio for 45-60 minutes 3-4 times per week. [name] has increased eating out recently but has increased strengthening exercises as well. His kids are going back to school which should help with his routine and meal planning.  His weight is 234 lb (106.1 kg) today and has gained 2 pounds since his last visit. He has lost 19 lbs since starting treatment with [name].  Vitamin D Deficiency [name] has a diagnosis of vitamin D deficiency. His level is now at goal on Vit D prescription. He denies nausea, vomiting or muscle weakness.  At risk for cardiovascular disease [name] is at a higher than average risk for cardiovascular disease due to obesity. He currently denies any chest pain.  ALLERGIES: No Known Allergies  MEDICATIONS: Current Outpatient Medications on File Prior to Visit  Medication Sig Dispense Refill  . HUMIRA PEN 40 MG/0.8ML PNKT Inject 40 mg into the skin See admin instructions. Twice monthly (1st & 15th)  1  . levothyroxine (SYNTHROID, LEVOTHROID) 50 MCG tablet Take 75 mcg by mouth daily.   3  . meloxicam (MOBIC) 15 MG tablet Take 15 mg by mouth daily as needed.     . methocarbamol (ROBAXIN) 750 MG tablet TAKE 1 TABLET DAILY PRN  2  . methylphenidate (RITALIN) 10 MG tablet Take 1 tablet (10 mg total) by mouth 2 (two) times daily. 60 tablet 0  . Vitamin D, Ergocalciferol, (DRISDOL) 50000 units CAPS capsule Take 1 capsule (50,000 Units total) by mouth every 7 (seven) days. 4 capsule 0   Current Facility-Administered Medications on File Prior to Visit  Medication Dose Route Frequency Provider Last Rate Last Dose  . gadopentetate dimeglumine (MAGNEVIST) injection 20 mL  20 mL Intravenous Once PRN  Dohmeier, Porfirio Mylar, MD        PAST MEDICAL HISTORY: Past Medical History:  Diagnosis Date  . ADD (attention deficit disorder)   . Adult night terrors 03/29/2014  . Alcohol abuse   . Ankylosing spondylitis (HCC)   . Anxiety   . Asthma    EXERCISE INDUCED  RARELY  USES INHALER  . Back pain   . Depression   . Drug use   . Fatty liver   . Hypothyroidism   . IBS (irritable bowel syndrome)   . Joint pain   . Night terrors   . Sleep apnea    SLEEPS W/ MOUTH GUARD     PAST SURGICAL HISTORY: Past Surgical History:  Procedure Laterality Date  . KNEE SURGERY Left   . LUMBAR LAMINECTOMY/DECOMPRESSION MICRODISCECTOMY N/A 01/14/2017   Procedure: LUMBAR 3-4 DECOMPRESSION;  Surgeon: Estill Bamberg, MD;  Location: [facility] OR;  Service: Orthopedics;  Laterality: N/A;  LUMBAR 2-4 DECOMPRESSION / 2 HRS FOR CASE.  . TONSILLECTOMY AND ADENOIDECTOMY  1992  . WISDOM TOOTH EXTRACTION      SOCIAL HISTORY: Social History   Tobacco Use  . Smoking status: Former Smoker    Years: 4.00    Types: Cigarettes    Last attempt to quit: 01/16/1986    Years since quitting: 31.6  . Smokeless tobacco: Never Used  Substance Use Topics  . Alcohol use: Yes    Alcohol/week: 0.0 standard drinks    Comment: Two  drinks daily  . Drug use: Yes    Types: Marijuana    FAMILY HISTORY: Family History  Problem Relation Age of Onset  . Aneurysm Mother   . Sudden death Mother   . Stroke Father   . Hypertension Father   . Thyroid disease Father   . Alcoholism Father   . Aneurysm Other        brain    ROS: Review of Systems  Constitutional: Negative for weight loss.  Cardiovascular: Negative for chest pain.  Gastrointestinal: Negative for nausea and vomiting.  Musculoskeletal:       Negative muscle weakness    PHYSICAL EXAM: Blood pressure 126/77, pulse (!) 57, temperature 97.6 F (36.4 C), temperature source Oral, height 6' (1.829 m), weight 234 lb (106.1 kg), SpO2 99 %. Body mass index is 31.74  kg/m. Physical Exam  Constitutional: He is oriented to person, place, and time. He appears well-developed and well-nourished.  Cardiovascular: Normal rate.  Pulmonary/Chest: Effort normal.  Musculoskeletal: Normal range of motion.  Neurological: He is oriented to person, place, and time.  Skin: Skin is warm and dry.  Psychiatric: He has a normal mood and affect. His behavior is normal.  Vitals reviewed.   RECENT LABS AND TESTS: BMET    Component Value Date/Time   NA 140 06/22/2017 1048   K 4.5 06/22/2017 1048   CL 101 06/22/2017 1048   CO2 23 06/22/2017 1048   GLUCOSE 95 06/22/2017 1048   GLUCOSE 124 (H) 01/09/2017 1231   BUN 14 06/22/2017 1048   CREATININE 1.01 06/22/2017 1048   CALCIUM 9.4 06/22/2017 1048   GFRNONAA 85 06/22/2017 1048   GFRAA 98 06/22/2017 1048   Lab Results  Component Value Date   HGBA1C 5.1 06/22/2017   HGBA1C 5.3 02/16/2017   Lab Results  Component Value Date   INSULIN 8.0 06/22/2017   INSULIN 10.0 02/16/2017   CBC    Component Value Date/Time   WBC 4.7 06/22/2017 1048   WBC 5.5 01/09/2017 1231   RBC 5.55 06/22/2017 1048   RBC 5.50 01/09/2017 1231   HGB 15.5 06/22/2017 1048   HCT 46.5 06/22/2017 1048   PLT 173 01/09/2017 1231   MCV 84 06/22/2017 1048   MCH 27.9 06/22/2017 1048   MCH 27.6 01/09/2017 1231   MCHC 33.3 06/22/2017 1048   MCHC 33.1 01/09/2017 1231   RDW 14.5 06/22/2017 1048   LYMPHSABS 1.6 06/22/2017 1048   MONOABS 0.4 01/09/2017 1231   EOSABS 0.1 06/22/2017 1048   BASOSABS 0.0 06/22/2017 1048   Iron/TIBC/Ferritin/ %Sat No results found for: IRON, TIBC, FERRITIN, IRONPCTSAT Lipid Panel     Component Value Date/Time   CHOL 162 06/22/2017 1048   TRIG 93 06/22/2017 1048   HDL 49 06/22/2017 1048   LDLCALC 94 06/22/2017 1048   Hepatic Function Panel     Component Value Date/Time   PROT 7.2 06/22/2017 1048   ALBUMIN 4.7 06/22/2017 1048   AST 23 06/22/2017 1048   ALT 35 06/22/2017 1048   ALKPHOS 55 06/22/2017 1048    BILITOT 0.9 06/22/2017 1048      Component Value Date/Time   TSH 4.000 06/22/2017 1048   TSH 3.430 02/16/2017 1142  Results for [name], [name] (MRN [id number]) as of 09/07/2017 12:22  Ref. Range 06/22/2017 10:48  Vitamin D, 25-Hydroxy Latest Ref Range: 30.0 - 100.0 ng/mL 53.0    ASSESSMENT AND PLAN: Vitamin D deficiency - Plan: Vitamin D, Ergocalciferol, (DRISDOL) 50000 units CAPS capsule  At risk  for heart disease  Class 1 obesity with serious comorbidity and body mass index (BMI) of 31.0 to 31.9 in adult, unspecified obesity type  PLAN:  Vitamin D Deficiency Igor was informed that low vitamin D levels contributes to fatigue and are associated with obesity, breast, and colon cancer. Trevan agrees to continue taking prescription Vit D @50 ,000 IU every week #4 and we will refill for 1 month. He will follow up for routine testing of vitamin D, at least 2-3 times per year. He was informed of the risk of over-replacement of vitamin D and agrees to not increase his dose unless he discusses this with [name] first. Ahmaad agrees to follow up with our clinic in 4 weeks.  Cardiovascular risk counselling Deylan was given extended (15 minutes) coronary artery disease prevention counseling today. He is 52 y.o. male and has risk factors for heart disease including obesity. We discussed intensive lifestyle modifications today with an emphasis on specific weight loss instructions and strategies. Pt was also informed of the importance of increasing exercise and decreasing saturated fats to help prevent heart disease.  Obesity Izear is currently in the action stage of change. As such, his goal is to continue with weight loss efforts He has agreed to keep a food journal with 400-600 calories and 40 grams of protein at lunch or supper daily and follow the Category 3 plan Shawndale has been instructed to work up to a goal of 150 minutes of combined cardio and strengthening exercise per week for weight loss  and overall health benefits. We discussed the following Behavioral Modification Strategies today: increasing lean protein intake, decrease eating out and work on meal planning and easy cooking plans   Djuan has agreed to follow up with our clinic in 4 weeks. He was informed of the importance of frequent follow up visits to maximize his success with intensive lifestyle modifications for his multiple health conditions.   OBESITY BEHAVIORAL INTERVENTION VISIT  Today's visit was # 10.   Starting weight: 253 lbs Starting date: 02/16/17 Today's weight : 234 lbs Today's date: 09/07/2017 Total lbs lost to date: 19 At least 15 minutes were spent on discussing the following behavioral intervention visit.   ASK: We discussed the diagnosis of obesity with Elo Harrison Frett today and Zacharia agreed to give [name] permission to discuss obesity behavioral modification therapy today.  ASSESS: Gustave has the diagnosis of obesity and his BMI today is 31.73 Roran is in the action stage of change   ADVISE: Jakylan was educated on the multiple health risks of obesity as well as the benefit of weight loss to improve his health. He was advised of the need for long term treatment and the importance of lifestyle modifications to improve his current health and to decrease his risk of future health problems.  AGREE: Multiple dietary modification options and treatment options were discussed and  Tayten agreed to follow the recommendations documented in the above note.  ARRANGE: Alija was educated on the importance of frequent visits to treat obesity as outlined per CMS and USPSTF guidelines and agreed to schedule his next follow up appointment today.  I, Burt Knack, am acting as transcriptionist for Quillian Quince, MD  I have reviewed the above documentation for accuracy and completeness, and I agree with the above. -Quillian Quince, MD

## 2017-10-05 ENCOUNTER — Ambulatory Visit (INDEPENDENT_AMBULATORY_CARE_PROVIDER_SITE_OTHER): Payer: 59 | Admitting: Family Medicine

## 2017-10-05 VITALS — BP 138/74 | HR 55 | Temp 97.6°F | Ht 72.0 in | Wt 227.0 lb

## 2017-10-05 DIAGNOSIS — E669 Obesity, unspecified: Secondary | ICD-10-CM | POA: Diagnosis not present

## 2017-10-05 DIAGNOSIS — E559 Vitamin D deficiency, unspecified: Secondary | ICD-10-CM | POA: Diagnosis not present

## 2017-10-05 DIAGNOSIS — Z9189 Other specified personal risk factors, not elsewhere classified: Secondary | ICD-10-CM

## 2017-10-05 DIAGNOSIS — Z6830 Body mass index (BMI) 30.0-30.9, adult: Secondary | ICD-10-CM

## 2017-10-05 MED ORDER — VITAMIN D (ERGOCALCIFEROL) 1.25 MG (50000 UNIT) PO CAPS: 50000.0000 [IU] | ORAL_CAPSULE | ORAL | 0 refills | Status: DC

## 2017-10-06 NOTE — Progress Notes (Signed)
Office: [phone]  /  Fax: [phone]   HPI:   Chief Complaint: OBESITY [name] is here to discuss his progress with his obesity treatment plan. He is on the keep a food journal with 400-600 calories and 40 grams of protein at lunch or supper daily and follow the Category 3 plan and is following his eating plan approximately 90 % of the time. He states he is doing cardio and weights for 60 minutes 3-4 times per week. [name] has been doing well on Category 3 plan. He notes he is still eating out frequently, but he is much more mindful about his food choices.  His weight is 227 lb (103 kg) today and has had a weight loss of 7 pounds over a period of 4 weeks since his last visit. He has lost 26 lbs since starting treatment with [name].  Vitamin D Deficiency [name] has a diagnosis of vitamin D deficiency. He is on prescription Vit D and denies nausea, vomiting or muscle weakness.  At risk for osteopenia and osteoporosis [name] is at higher risk of osteopenia and osteoporosis due to vitamin D deficiency.   ALLERGIES: No Known Allergies  MEDICATIONS: Current Outpatient Medications on File Prior to Visit  Medication Sig Dispense Refill  . HUMIRA PEN 40 MG/0.8ML PNKT Inject 40 mg into the skin See admin instructions. Twice monthly (1st & 15th)  1  . levothyroxine (SYNTHROID, LEVOTHROID) 50 MCG tablet Take 75 mcg by mouth daily.   3  . meloxicam (MOBIC) 15 MG tablet Take 15 mg by mouth daily as needed.     . methocarbamol (ROBAXIN) 750 MG tablet TAKE 1 TABLET DAILY PRN  2  . methylphenidate (RITALIN) 10 MG tablet Take 1 tablet (10 mg total) by mouth 2 (two) times daily. 60 tablet 0   Current Facility-Administered Medications on File Prior to Visit  Medication Dose Route Frequency Provider Last Rate Last Dose  . gadopentetate dimeglumine (MAGNEVIST) injection 20 mL  20 mL Intravenous Once PRN Dohmeier, Porfirio Mylararmen, MD        PAST MEDICAL HISTORY: Past Medical History:  Diagnosis Date  . ADD  (attention deficit disorder)   . Adult night terrors 03/29/2014  . Alcohol abuse   . Ankylosing spondylitis (HCC)   . Anxiety   . Asthma    EXERCISE INDUCED  RARELY  USES INHALER  . Back pain   . Depression   . Drug use   . Fatty liver   . Hypothyroidism   . IBS (irritable bowel syndrome)   . Joint pain   . Night terrors   . Sleep apnea    SLEEPS W/ MOUTH GUARD     PAST SURGICAL HISTORY: Past Surgical History:  Procedure Laterality Date  . KNEE SURGERY Left   . LUMBAR LAMINECTOMY/DECOMPRESSION MICRODISCECTOMY N/A 01/14/2017   Procedure: LUMBAR 3-4 DECOMPRESSION;  Surgeon: Estill Bambergumonski, Mark, MD;  Location: [facility] OR;  Service: Orthopedics;  Laterality: N/A;  LUMBAR 2-4 DECOMPRESSION / 2 HRS FOR CASE.  . TONSILLECTOMY AND ADENOIDECTOMY  1992  . WISDOM TOOTH EXTRACTION      SOCIAL HISTORY: Social History   Tobacco Use  . Smoking status: Former Smoker    Years: 4.00    Types: Cigarettes    Last attempt to quit: 01/16/1986    Years since quitting: 31.7  . Smokeless tobacco: Never Used  Substance Use Topics  . Alcohol use: Yes    Alcohol/week: 0.0 standard drinks    Comment: Two drinks daily  . Drug use:  Yes    Types: Marijuana    FAMILY HISTORY: Family History  Problem Relation Age of Onset  . Aneurysm Mother   . Sudden death Mother   . Stroke Father   . Hypertension Father   . Thyroid disease Father   . Alcoholism Father   . Aneurysm Other        brain    ROS: Review of Systems  Constitutional: Positive for weight loss.  Gastrointestinal: Negative for nausea and vomiting.  Musculoskeletal:       Negative muscle weakness    PHYSICAL EXAM: Blood pressure 138/74, pulse (!) 55, temperature 97.6 F (36.4 C), temperature source Oral, height 6' (1.829 m), weight 227 lb (103 kg), SpO2 100 %. Body mass index is 30.79 kg/m. Physical Exam  Constitutional: He is oriented to person, place, and time. He appears well-developed and well-nourished.  Cardiovascular: Normal  rate.  Pulmonary/Chest: Effort normal.  Musculoskeletal: Normal range of motion.  Neurological: He is oriented to person, place, and time.  Skin: Skin is warm and dry.  Psychiatric: He has a normal mood and affect. His behavior is normal.  Vitals reviewed.   RECENT LABS AND TESTS: BMET    Component Value Date/Time   NA 140 06/22/2017 1048   K 4.5 06/22/2017 1048   CL 101 06/22/2017 1048   CO2 23 06/22/2017 1048   GLUCOSE 95 06/22/2017 1048   GLUCOSE 124 (H) 01/09/2017 1231   BUN 14 06/22/2017 1048   CREATININE 1.01 06/22/2017 1048   CALCIUM 9.4 06/22/2017 1048   GFRNONAA 85 06/22/2017 1048   GFRAA 98 06/22/2017 1048   Lab Results  Component Value Date   HGBA1C 5.1 06/22/2017   HGBA1C 5.3 02/16/2017   Lab Results  Component Value Date   INSULIN 8.0 06/22/2017   INSULIN 10.0 02/16/2017   CBC    Component Value Date/Time   WBC 4.7 06/22/2017 1048   WBC 5.5 01/09/2017 1231   RBC 5.55 06/22/2017 1048   RBC 5.50 01/09/2017 1231   HGB 15.5 06/22/2017 1048   HCT 46.5 06/22/2017 1048   PLT 173 01/09/2017 1231   MCV 84 06/22/2017 1048   MCH 27.9 06/22/2017 1048   MCH 27.6 01/09/2017 1231   MCHC 33.3 06/22/2017 1048   MCHC 33.1 01/09/2017 1231   RDW 14.5 06/22/2017 1048   LYMPHSABS 1.6 06/22/2017 1048   MONOABS 0.4 01/09/2017 1231   EOSABS 0.1 06/22/2017 1048   BASOSABS 0.0 06/22/2017 1048   Iron/TIBC/Ferritin/ %Sat No results found for: IRON, TIBC, FERRITIN, IRONPCTSAT Lipid Panel     Component Value Date/Time   CHOL 162 06/22/2017 1048   TRIG 93 06/22/2017 1048   HDL 49 06/22/2017 1048   LDLCALC 94 06/22/2017 1048   Hepatic Function Panel     Component Value Date/Time   PROT 7.2 06/22/2017 1048   ALBUMIN 4.7 06/22/2017 1048   AST 23 06/22/2017 1048   ALT 35 06/22/2017 1048   ALKPHOS 55 06/22/2017 1048   BILITOT 0.9 06/22/2017 1048      Component Value Date/Time   TSH 4.000 06/22/2017 1048   TSH 3.430 02/16/2017 1142   Results for SHILO, PAUWELS (MRN [id number]) as of 10/06/2017 10:55  Ref. Range 06/22/2017 10:48  Vitamin D, 25-Hydroxy Latest Ref Range: 30.0 - 100.0 ng/mL 53.0   ASSESSMENT AND PLAN: Vitamin D deficiency - Plan: Vitamin D, Ergocalciferol, (DRISDOL) 50000 units CAPS capsule  At risk for osteoporosis  Class 1 obesity with serious comorbidity and body mass  index (BMI) of 30.0 to 30.9 in adult, unspecified obesity type  PLAN:  Vitamin D Deficiency [name] was informed that low vitamin D levels contributes to fatigue and are associated with obesity, breast, and colon cancer. [name] agrees to continue taking prescription Vit D @50 ,000 IU every week #4 and we will refill for 1 month. He will follow up for routine testing of vitamin D, at least 2-3 times per year. He was informed of the risk of over-replacement of vitamin D and agrees to not increase his dose unless he discusses this with [name] first. [name] agrees to follow up with our clinic in 4 weeks and we will recheck labs at that time.  At risk for osteopenia and osteoporosis [name] was given extended  (15 minutes) osteoporosis prevention counseling today. [name] is at risk for osteopenia and osteoporsis due to his vitamin D deficiency. He was encouraged to take his vitamin D and follow his higher calcium diet and increase strengthening exercise to help strengthen his bones and decrease his risk of osteopenia and osteoporosis.  Obesity [name] is currently in the action stage of change. As such, his goal is to continue with weight loss efforts He has agreed to keep a food journal with 400-600 calories and 40 grams of protein at lunch or supper daily and follow the Category 3 plan [name] has been instructed to work up to a goal of 150 minutes of combined cardio and strengthening exercise per week for weight loss and overall health benefits. We discussed the following Behavioral Modification Strategies today: increasing lean protein intake and decreasing simple carbohydrates      [name] has agreed to follow up with our clinic in 4 weeks. He was informed of the importance of frequent follow up visits to maximize his success with intensive lifestyle modifications for his multiple health conditions.   OBESITY BEHAVIORAL INTERVENTION VISIT  Today's visit was # 11   Starting weight: 253 lbs Starting date: 02/16/17 Today's weight : 227 lbs Today's date: 10/05/2017 Total lbs lost to date: 2    ASK: We discussed the diagnosis of obesity with [name] today and [name] agreed to give [name] permission to discuss obesity behavioral modification therapy today.  ASSESS: [name] has the diagnosis of obesity and his BMI today is 30.78 [name] is in the action stage of change   ADVISE: [name] was educated on the multiple health risks of obesity as well as the benefit of weight loss to improve his health. He was advised of the need for long term treatment and the importance of lifestyle modifications to improve his current health and to decrease his risk of future health problems.  AGREE: Multiple dietary modification options and treatment options were discussed and  [name] agreed to follow the recommendations documented in the above note.  ARRANGE: [name] was educated on the importance of frequent visits to treat obesity as outlined per CMS and USPSTF guidelines and agreed to schedule his next follow up appointment today.  I, Burt Knack, am acting as transcriptionist for Quillian Quince, MD  I have reviewed the above documentation for accuracy and completeness, and I agree with the above. -Quillian Quince, MD

## 2017-10-07 ENCOUNTER — Other Ambulatory Visit: Payer: Self-pay | Admitting: Nurse Practitioner

## 2017-10-07 DIAGNOSIS — G4730 Sleep apnea, unspecified: Principal | ICD-10-CM

## 2017-10-07 DIAGNOSIS — G4710 Hypersomnia, unspecified: Secondary | ICD-10-CM

## 2017-10-07 DIAGNOSIS — G4733 Obstructive sleep apnea (adult) (pediatric): Secondary | ICD-10-CM

## 2017-10-07 DIAGNOSIS — F901 Attention-deficit hyperactivity disorder, predominantly hyperactive type: Secondary | ICD-10-CM

## 2017-10-07 DIAGNOSIS — Z9989 Dependence on other enabling machines and devices: Secondary | ICD-10-CM

## 2017-10-07 MED ORDER — METHYLPHENIDATE HCL 10 MG PO TABS: 10.0000 mg | ORAL_TABLET | Freq: Two times a day (BID) | ORAL | 0 refills | Status: DC

## 2017-10-07 NOTE — Telephone Encounter (Signed)
[facility] Verified Last refilled 08/26/2017

## 2017-10-07 NOTE — Addendum Note (Signed)
Addended by: Manuela Schwartz on: 10/07/2017 01:15 PM   Modules accepted: Orders

## 2017-10-07 NOTE — Telephone Encounter (Signed)
Patient requesting refill of  methylphenidate (RITALIN) 10 MG tablet sent to [facility] in [place]. ° ° °

## 2017-11-02 ENCOUNTER — Ambulatory Visit (INDEPENDENT_AMBULATORY_CARE_PROVIDER_SITE_OTHER): Payer: 59 | Admitting: Family Medicine

## 2017-11-02 VITALS — BP 138/72 | HR 55 | Ht 72.0 in | Wt 228.0 lb

## 2017-11-02 DIAGNOSIS — E8881 Metabolic syndrome: Secondary | ICD-10-CM

## 2017-11-02 DIAGNOSIS — E669 Obesity, unspecified: Secondary | ICD-10-CM

## 2017-11-02 DIAGNOSIS — E559 Vitamin D deficiency, unspecified: Secondary | ICD-10-CM | POA: Diagnosis not present

## 2017-11-02 DIAGNOSIS — Z9189 Other specified personal risk factors, not elsewhere classified: Secondary | ICD-10-CM

## 2017-11-02 DIAGNOSIS — Z6831 Body mass index (BMI) 31.0-31.9, adult: Secondary | ICD-10-CM

## 2017-11-02 MED ORDER — VITAMIN D (ERGOCALCIFEROL) 1.25 MG (50000 UNIT) PO CAPS: 50000.0000 [IU] | ORAL_CAPSULE | ORAL | 0 refills | Status: DC

## 2017-11-03 LAB — INSULIN, RANDOM: INSULIN: 8.8 u[IU]/mL (ref 2.6–24.9)

## 2017-11-03 LAB — COMPREHENSIVE METABOLIC PANEL
A/G RATIO: 1.7 (ref 1.2–2.2)
ALT: 35 IU/L (ref 0–44)
AST: 27 IU/L (ref 0–40)
Albumin: 4.5 g/dL (ref 3.5–5.5)
Alkaline Phosphatase: 50 IU/L (ref 39–117)
BUN/Creatinine Ratio: 20 (ref 9–20)
BUN: 18 mg/dL (ref 6–24)
Bilirubin Total: 0.6 mg/dL (ref 0.0–1.2)
CALCIUM: 9.2 mg/dL (ref 8.7–10.2)
CO2: 24 mmol/L (ref 20–29)
Chloride: 104 mmol/L (ref 96–106)
Creatinine, Ser: 0.92 mg/dL (ref 0.76–1.27)
GFR calc non Af Amer: 95 mL/min/{1.73_m2} (ref >59)
GFR, EST AFRICAN AMERICAN: 110 mL/min/{1.73_m2} (ref >59)
Globulin, Total: 2.6 g/dL (ref 1.5–4.5)
Glucose: 100 mg/dL — ABNORMAL HIGH (ref 65–99)
POTASSIUM: 4.3 mmol/L (ref 3.5–5.2)
Sodium: 143 mmol/L (ref 134–144)
TOTAL PROTEIN: 7.1 g/dL (ref 6.0–8.5)

## 2017-11-03 LAB — HEMOGLOBIN A1C
Est. average glucose Bld gHb Est-mCnc: 103 mg/dL
Hgb A1c MFr Bld: 5.2 % (ref 4.8–5.6)

## 2017-11-03 LAB — VITAMIN D 25 HYDROXY (VIT D DEFICIENCY, FRACTURES): Vit D, 25-Hydroxy: 47.6 ng/mL (ref 30.0–100.0)

## 2017-11-04 NOTE — Progress Notes (Signed)
Office: [phone]  /  Fax: [phone]   HPI:   Chief Complaint: OBESITY [name] is here to discuss his progress with his obesity treatment plan. He is on the Category 3 plan and is following his eating plan approximately 90 % of the time. He states he is exercising 0 minutes 0 times per week. [name] is doing well maintaining weight. He travels a lot and has to eat out frequently, but is trying the portion control and smarter choices plan.  His weight is 228 lb (103.4 kg) today and has had a weight gain of 1 pound over a period of 4 weeks since his last visit. He has lost 25 lbs since starting treatment with [name].  Vitamin D deficiency [name] has a diagnosis of vitamin D deficiency. He is currently taking vit D and his last level was at goal. He denies nausea, vomiting or muscle weakness.  At risk for osteopenia and osteoporosis [name] is at higher risk of osteopenia and osteoporosis due to vitamin D deficiency.   Insulin Resistance [name] has a diagnosis of insulin resistance based on his elevated fasting insulin level >5. Although [name]'s blood glucose readings are still under good control, insulin resistance puts him at greater risk of metabolic syndrome and diabetes. He is stable with his diet. He continues to work on diet and exercise to decrease risk of diabetes. He denies polyuria, polyphagia, or polydipsia.  ALLERGIES: No Known Allergies  MEDICATIONS: Current Outpatient Medications on File Prior to Visit  Medication Sig Dispense Refill  . HUMIRA PEN 40 MG/0.8ML PNKT Inject 40 mg into the skin See admin instructions. Twice monthly (1st & 15th)  1  . levothyroxine (SYNTHROID, LEVOTHROID) 50 MCG tablet Take 75 mcg by mouth daily.   3  . meloxicam (MOBIC) 15 MG tablet Take 15 mg by mouth daily as needed.     . methocarbamol (ROBAXIN) 750 MG tablet TAKE 1 TABLET DAILY PRN  2  . methylphenidate (RITALIN) 10 MG tablet Take 1 tablet (10 mg total) by mouth 2 (two) times daily. 60  tablet 0   Current Facility-Administered Medications on File Prior to Visit  Medication Dose Route Frequency Provider Last Rate Last Dose  . gadopentetate dimeglumine (MAGNEVIST) injection 20 mL  20 mL Intravenous Once PRN Dohmeier, Porfirio Mylar, MD        PAST MEDICAL HISTORY: Past Medical History:  Diagnosis Date  . ADD (attention deficit disorder)   . Adult night terrors 03/29/2014  . Alcohol abuse   . Ankylosing spondylitis (HCC)   . Anxiety   . Asthma    EXERCISE INDUCED  RARELY  USES INHALER  . Back pain   . Depression   . Drug use   . Fatty liver   . Hypothyroidism   . IBS (irritable bowel syndrome)   . Joint pain   . Night terrors   . Sleep apnea    SLEEPS W/ MOUTH GUARD     PAST SURGICAL HISTORY: Past Surgical History:  Procedure Laterality Date  . KNEE SURGERY Left   . LUMBAR LAMINECTOMY/DECOMPRESSION MICRODISCECTOMY N/A 01/14/2017   Procedure: LUMBAR 3-4 DECOMPRESSION;  Surgeon: Estill Bamberg, MD;  Location: [facility] OR;  Service: Orthopedics;  Laterality: N/A;  LUMBAR 2-4 DECOMPRESSION / 2 HRS FOR CASE.  . TONSILLECTOMY AND ADENOIDECTOMY  1992  . WISDOM TOOTH EXTRACTION      SOCIAL HISTORY: Social History   Tobacco Use  . Smoking status: Former Smoker    Years: 4.00    Types: Cigarettes  Last attempt to quit: 01/16/1986    Years since quitting: 31.8  . Smokeless tobacco: Never Used  Substance Use Topics  . Alcohol use: Yes    Alcohol/week: 0.0 standard drinks    Comment: Two drinks daily  . Drug use: Yes    Types: Marijuana    FAMILY HISTORY: Family History  Problem Relation Age of Onset  . Aneurysm Mother   . Sudden death Mother   . Stroke Father   . Hypertension Father   . Thyroid disease Father   . Alcoholism Father   . Aneurysm Other        brain    ROS: Review of Systems  Constitutional: Negative for weight loss.  Gastrointestinal: Negative for nausea and vomiting.  Genitourinary:       Negative for polyuria.  Musculoskeletal:        Negative for muscle weakness.  Endo/Heme/Allergies: Negative for polydipsia.       Negative for polyphagia.    PHYSICAL EXAM: Blood pressure 138/72, pulse (!) 55, height 6' (1.829 m), weight 228 lb (103.4 kg), SpO2 98 %. Body mass index is 30.92 kg/m. Physical Exam  Constitutional: He is oriented to person, place, and time. He appears well-developed and well-nourished.  Cardiovascular: Normal rate.  Pulmonary/Chest: Effort normal.  Musculoskeletal: Normal range of motion.  Neurological: He is oriented to person, place, and time.  Skin: Skin is warm and dry.  Psychiatric: He has a normal mood and affect. His behavior is normal.  Vitals reviewed.   RECENT LABS AND TESTS: BMET    Component Value Date/Time   NA 143 11/02/2017 1408   K 4.3 11/02/2017 1408   CL 104 11/02/2017 1408   CO2 24 11/02/2017 1408   GLUCOSE 100 (H) 11/02/2017 1408   GLUCOSE 124 (H) 01/09/2017 1231   BUN 18 11/02/2017 1408   CREATININE 0.92 11/02/2017 1408   CALCIUM 9.2 11/02/2017 1408   GFRNONAA 95 11/02/2017 1408   GFRAA 110 11/02/2017 1408   Lab Results  Component Value Date   HGBA1C 5.2 11/02/2017   HGBA1C 5.1 06/22/2017   HGBA1C 5.3 02/16/2017   Lab Results  Component Value Date   INSULIN 8.8 11/02/2017   INSULIN 8.0 06/22/2017   INSULIN 10.0 02/16/2017   CBC    Component Value Date/Time   WBC 4.7 06/22/2017 1048   WBC 5.5 01/09/2017 1231   RBC 5.55 06/22/2017 1048   RBC 5.50 01/09/2017 1231   HGB 15.5 06/22/2017 1048   HCT 46.5 06/22/2017 1048   PLT 173 01/09/2017 1231   MCV 84 06/22/2017 1048   MCH 27.9 06/22/2017 1048   MCH 27.6 01/09/2017 1231   MCHC 33.3 06/22/2017 1048   MCHC 33.1 01/09/2017 1231   RDW 14.5 06/22/2017 1048   LYMPHSABS 1.6 06/22/2017 1048   MONOABS 0.4 01/09/2017 1231   EOSABS 0.1 06/22/2017 1048   BASOSABS 0.0 06/22/2017 1048   Iron/TIBC/Ferritin/ %Sat No results found for: IRON, TIBC, FERRITIN, IRONPCTSAT Lipid Panel     Component Value Date/Time     CHOL 162 06/22/2017 1048   TRIG 93 06/22/2017 1048   HDL 49 06/22/2017 1048   LDLCALC 94 06/22/2017 1048   Hepatic Function Panel     Component Value Date/Time   PROT 7.1 11/02/2017 1408   ALBUMIN 4.5 11/02/2017 1408   AST 27 11/02/2017 1408   ALT 35 11/02/2017 1408   ALKPHOS 50 11/02/2017 1408   BILITOT 0.6 11/02/2017 1408      Component Value Date/Time  TSH 4.000 06/22/2017 1048   TSH 3.430 02/16/2017 1142   Results for QUARON, DELACRUZ (MRN [id number]) as of 11/04/2017 12:43  Ref. Range 11/02/2017 14:08  Vitamin D, 25-Hydroxy Latest Ref Range: 30.0 - 100.0 ng/mL 47.6   ASSESSMENT AND PLAN: Vitamin D deficiency - Plan: Vitamin D, Ergocalciferol, (DRISDOL) 50000 units CAPS capsule, VITAMIN D 25 Hydroxy (Vit-D Deficiency, Fractures)  Insulin resistance - Plan: Comprehensive metabolic panel, Hemoglobin A1c, Insulin, random  At risk for osteoporosis  Class 1 obesity with serious comorbidity and body mass index (BMI) of 31.0 to 31.9 in adult, unspecified obesity type  PLAN:  Vitamin D Deficiency Daquon was informed that low vitamin D levels contributes to fatigue and are associated with obesity, breast, and colon cancer. He agrees to continue to take prescription Vit D @50 ,000 IU every week #4 with no refills and will follow up for routine testing of vitamin D, at least 2-3 times per year. He was informed of the risk of over-replacement of vitamin D and agrees to not increase his dose unless he discusses this with [name] first. We will check labs today and Bayden agrees to follow up in 3 weeks.  At risk for osteopenia and osteoporosis Gevin was given extended (15 minutes) osteoporosis prevention counseling today. Juvencio is at risk for osteopenia and osteoporosis due to his vitamin D deficiency. He was encouraged to take his vitamin D and follow his higher calcium diet and increase strengthening exercise to help strengthen his bones and decrease his risk of osteopenia and  osteoporosis.  Insulin Resistance Adhvik will continue to work on weight loss, exercise, and decreasing simple carbohydrates in his diet to help decrease the risk of diabetes. He was informed that eating too many simple carbohydrates or too many calories at one sitting increases the likelihood of GI side effects. We will check labs today and he agrees to continue with diet and exercise. Caylin agreed to follow up with [name] as directed to monitor his progress.  Obesity Cooper is currently in the action stage of change. As such, his goal is to continue with weight loss efforts. He has agreed to the Category 3 plan and to keep a food journal with 1500 to 1800 calories and 100 grams of protein daily. Briar has been instructed to work up to a goal of 150 minutes of combined cardio and strengthening exercise per week for weight loss and overall health benefits. We discussed the following Behavioral Modification Strategies today: increasing lean protein intake, decreasing simple carbohydrates, work on meal planning and easy cooking plans, dealing with family or coworker sabotage, holiday eating strategies, and celebration eating strategies.   Ilay has agreed to follow up with our clinic in 3 weeks. He was informed of the importance of frequent follow up visits to maximize his success with intensive lifestyle modifications for his multiple health conditions.   OBESITY BEHAVIORAL INTERVENTION VISIT  Today's visit was # 12   Starting weight: 253 lbs Starting date: 02/16/17 Today's weight : Weight: 228 lb (103.4 kg)  Today's date: 11/02/2017 Total lbs lost to date: 25  ASK: We discussed the diagnosis of obesity with Mak J Tays today and Joanthan agreed to give [name] permission to discuss obesity behavioral modification therapy today.  ASSESS: Jaysten has the diagnosis of obesity and his BMI today is 30.92. Daishaun is in the action stage of change.   ADVISE: Demauri was educated on the multiple  health risks of obesity as well as the benefit of weight loss to  improve his health. He was advised of the need for long term treatment and the importance of lifestyle modifications to improve his current health and to decrease his risk of future health problems.  AGREE: Multiple dietary modification options and treatment options were discussed and Neftali agreed to follow the recommendations documented in the above note.  ARRANGE: Jovanny was educated on the importance of frequent visits to treat obesity as outlined per CMS and USPSTF guidelines and agreed to schedule his next follow up appointment today.  I, Kirke Corin, am acting as transcriptionist for Wilder Glade, MD  I have reviewed the above documentation for accuracy and completeness, and I agree with the above. -Quillian Quince, MD

## 2017-11-06 ENCOUNTER — Other Ambulatory Visit (INDEPENDENT_AMBULATORY_CARE_PROVIDER_SITE_OTHER): Payer: Self-pay | Admitting: Family Medicine

## 2017-11-06 DIAGNOSIS — E559 Vitamin D deficiency, unspecified: Secondary | ICD-10-CM

## 2017-11-09 ENCOUNTER — Encounter (INDEPENDENT_AMBULATORY_CARE_PROVIDER_SITE_OTHER): Payer: Self-pay | Admitting: Family Medicine

## 2017-11-23 ENCOUNTER — Ambulatory Visit (INDEPENDENT_AMBULATORY_CARE_PROVIDER_SITE_OTHER): Payer: 59 | Admitting: Family Medicine

## 2017-11-23 VITALS — BP 146/78 | HR 55 | Temp 97.9°F | Ht 72.0 in | Wt 231.0 lb

## 2017-11-23 DIAGNOSIS — Z6831 Body mass index (BMI) 31.0-31.9, adult: Secondary | ICD-10-CM

## 2017-11-23 DIAGNOSIS — Z9189 Other specified personal risk factors, not elsewhere classified: Secondary | ICD-10-CM

## 2017-11-23 DIAGNOSIS — E559 Vitamin D deficiency, unspecified: Secondary | ICD-10-CM

## 2017-11-23 DIAGNOSIS — E669 Obesity, unspecified: Secondary | ICD-10-CM

## 2017-11-23 MED ORDER — VITAMIN D (ERGOCALCIFEROL) 1.25 MG (50000 UNIT) PO CAPS: 50000.0000 [IU] | ORAL_CAPSULE | ORAL | 0 refills | Status: DC

## 2017-11-24 NOTE — Progress Notes (Signed)
Office: [phone]  /  Fax: [phone]   HPI:   Chief Complaint: OBESITY [name] is here to discuss his progress with his obesity treatment plan. He is on the Category 3 plan and is following his eating plan approximately 85 % of the time. He states he is doing cardio and using weights 60 minutes 3 to 4 times per week. 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 He has increased eating out and notes an increase in ETOH as well.  His weight is 231 lb (104.8 kg) today and has had a weight gain of 3 pounds over a period of 3 weeks since his last visit. He has lost 22 lbs since starting treatment with us.  Vitamin D deficiency [name] has a diagnosis of vitamin D deficiency. He is currently taking vit D and his level is almost at goal. He denies nausea, vomiting, or muscle weakness.  At risk for osteopenia and osteoporosis [name] is at higher risk of osteopenia and osteoporosis due to vitamin D deficiency.   ALLERGIES: No Known Allergies  MEDICATIONS: Current Outpatient Medications on File Prior to Visit  Medication Sig Dispense Refill  . HUMIRA PEN 40 MG/0.8ML PNKT Inject 40 mg into the skin See admin instructions. Twice monthly (1st & 15th)  1  . levothyroxine (SYNTHROID, LEVOTHROID) 50 MCG tablet Take 75 mcg by mouth daily.   3  . meloxicam (MOBIC) 15 MG tablet Take 15 mg by mouth daily as needed.     . methocarbamol (ROBAXIN) 750 MG tablet TAKE 1 TABLET DAILY PRN  2  . methylphenidate (RITALIN) 10 MG tablet Take 1 tablet (10 mg total) by mouth 2 (two) times daily. 60 tablet 0   Current Facility-Administered Medications on File Prior to Visit  Medication Dose Route Frequency Provider Last Rate Last Dose  . gadopentetate dimeglumine (MAGNEVIST) injection 20 mL  20 mL Intravenous Once PRN Dohmeier, Porfirio Mylararmen, MD        PAST MEDICAL HISTORY: Past Medical History:  Diagnosis Date  . ADD (attention deficit disorder)   . Adult  night terrors 03/29/2014  . Alcohol abuse   . Ankylosing spondylitis (HCC)   . Anxiety   . Asthma    EXERCISE INDUCED  RARELY  USES INHALER  . Back pain   . Depression   . Drug use   . Fatty liver   . Hypothyroidism   . IBS (irritable bowel syndrome)   . Joint pain   . Night terrors   . Sleep apnea    SLEEPS W/ MOUTH GUARD     PAST SURGICAL HISTORY: Past Surgical History:  Procedure Laterality Date  . KNEE SURGERY Left   . LUMBAR LAMINECTOMY/DECOMPRESSION MICRODISCECTOMY N/A 01/14/2017   Procedure: LUMBAR 3-4 DECOMPRESSION;  Surgeon: Estill Bambergumonski, Mark, MD;  Location: [facility] OR;  Service: Orthopedics;  Laterality: N/A;  LUMBAR 2-4 DECOMPRESSION / 2 HRS FOR CASE.  . TONSILLECTOMY AND ADENOIDECTOMY  1992  . WISDOM TOOTH EXTRACTION      SOCIAL HISTORY: Social History   Tobacco Use  . Smoking status: Former Smoker    Years: 4.00    Types: Cigarettes    Last attempt to quit: 01/16/1986    Years since quitting: 31.8  . Smokeless tobacco: Never Used  Substance Use Topics  . Alcohol use: Yes    Alcohol/week: 0.0 standard drinks    Comment: Two drinks daily  . Drug use: Yes    Types: Marijuana  FAMILY HISTORY: Family History  Problem Relation Age of Onset  . Aneurysm Mother   . Sudden death Mother   . Stroke Father   . Hypertension Father   . Thyroid disease Father   . Alcoholism Father   . Aneurysm Other        brain    ROS: Review of Systems  Constitutional: Negative for weight loss.  Gastrointestinal: Negative for nausea and vomiting.  Musculoskeletal:       Negative for muscle weakness.    PHYSICAL EXAM: Blood pressure (!) 146/78, pulse (!) 55, temperature 97.9 F (36.6 C), temperature source Oral, height 6' (1.829 m), weight 231 lb (104.8 kg), SpO2 100 %. Body mass index is 31.33 kg/m. Physical Exam  Constitutional: He is oriented to person, place, and time. He appears well-developed and well-nourished.  Cardiovascular: Normal rate.  Pulmonary/Chest: Effort  normal.  Musculoskeletal: Normal range of motion.  Neurological: He is oriented to person, place, and time.  Skin: Skin is warm and dry.  Psychiatric: He has a normal mood and affect. His behavior is normal.  Vitals reviewed.   RECENT LABS AND TESTS: BMET    Component Value Date/Time   NA 143 11/02/2017 1408   K 4.3 11/02/2017 1408   CL 104 11/02/2017 1408   CO2 24 11/02/2017 1408   GLUCOSE 100 (H) 11/02/2017 1408   GLUCOSE 124 (H) 01/09/2017 1231   BUN 18 11/02/2017 1408   CREATININE 0.92 11/02/2017 1408   CALCIUM 9.2 11/02/2017 1408   GFRNONAA 95 11/02/2017 1408   GFRAA 110 11/02/2017 1408   Lab Results  Component Value Date   HGBA1C 5.2 11/02/2017   HGBA1C 5.1 06/22/2017   HGBA1C 5.3 02/16/2017   Lab Results  Component Value Date   INSULIN 8.8 11/02/2017   INSULIN 8.0 06/22/2017   INSULIN 10.0 02/16/2017   CBC    Component Value Date/Time   WBC 4.7 06/22/2017 1048   WBC 5.5 01/09/2017 1231   RBC 5.55 06/22/2017 1048   RBC 5.50 01/09/2017 1231   HGB 15.5 06/22/2017 1048   HCT 46.5 06/22/2017 1048   PLT 173 01/09/2017 1231   MCV 84 06/22/2017 1048   MCH 27.9 06/22/2017 1048   MCH 27.6 01/09/2017 1231   MCHC 33.3 06/22/2017 1048   MCHC 33.1 01/09/2017 1231   RDW 14.5 06/22/2017 1048   LYMPHSABS 1.6 06/22/2017 1048   MONOABS 0.4 01/09/2017 1231   EOSABS 0.1 06/22/2017 1048   BASOSABS 0.0 06/22/2017 1048   Iron/TIBC/Ferritin/ %Sat No results found for: IRON, TIBC, FERRITIN, IRONPCTSAT Lipid Panel     Component Value Date/Time   CHOL 162 06/22/2017 1048   TRIG 93 06/22/2017 1048   HDL 49 06/22/2017 1048   LDLCALC 94 06/22/2017 1048   Hepatic Function Panel     Component Value Date/Time   PROT 7.1 11/02/2017 1408   ALBUMIN 4.5 11/02/2017 1408   AST 27 11/02/2017 1408   ALT 35 11/02/2017 1408   ALKPHOS 50 11/02/2017 1408   BILITOT 0.6 11/02/2017 1408      Component Value Date/Time   TSH 4.000 06/22/2017 1048   TSH 3.430 02/16/2017 1142    Results for LEEANDRE, NORDLING (MRN [id number]) as of 11/24/2017 12:55  Ref. Range 11/02/2017 14:08  Vitamin D, 25-Hydroxy Latest Ref Range: 30.0 - 100.0 ng/mL 47.6   ASSESSMENT AND PLAN: Vitamin D deficiency - Plan: Vitamin D, Ergocalciferol, (DRISDOL) 1.25 MG (50000 UT) CAPS capsule  At risk for osteoporosis  Class 1 obesity  with serious comorbidity and body mass index (BMI) of 31.0 to 31.9 in adult, unspecified obesity type  PLAN:  Vitamin D Deficiency Alvaro was informed that low vitamin D levels contributes to fatigue and are associated with obesity, breast, and colon cancer. He agrees to continue to take prescription Vit D @50 ,000 IU every week #4 with no refills and will follow up for routine testing of vitamin D, at least 2-3 times per year. He was informed of the risk of over-replacement of vitamin D and agrees to not increase his dose unless he discusses this with [name] first. Geoffry agrees to follow up in 3 weeks as directed.  At risk for osteopenia and osteoporosis Damario was given extended (15 minutes) osteoporosis prevention counseling today. Dornell is at risk for osteopenia and osteoporosis due to his vitamin D deficiency. He was encouraged to take his vitamin D and follow his higher calcium diet and increase strengthening exercise to help strengthen his bones and decrease his risk of osteopenia and osteoporosis.  Obesity Jakwan is currently in the action stage of change. As such, his goal is to maintain weight for now over Thanksgiving. He has agreed to follow the Category 3 plan. Davari has been instructed to work up to a goal of 150 minutes of combined cardio and strengthening exercise per week for weight loss and overall health benefits. We discussed the following Behavioral Modification Strategies today: increasing lean protein intake, decreasing simple carbohydrates, and holiday eating strategies.   Dreon has agreed to follow up with our clinic in 3 weeks. He was  informed of the importance of frequent follow up visits to maximize his success with intensive lifestyle modifications for his multiple health conditions.   OBESITY BEHAVIORAL INTERVENTION VISIT  Today's visit was # 13   Starting weight: 253 lbs Starting date: 02/16/17 Today's weight : Weight: 231 lb (104.8 kg)  Today's date: 11/23/2017 Total lbs lost to date: 22  ASK: We discussed the diagnosis of obesity with Irwin J Upchurch today and Byrant agreed to give [name] permission to discuss obesity behavioral modification therapy today.  ASSESS: Horice has the diagnosis of obesity and his BMI today is 31.32. Aengus is in the action stage of change.   ADVISE: Adian was educated on the multiple health risks of obesity as well as the benefit of weight loss to improve his health. He was advised of the need for long term treatment and the importance of lifestyle modifications to improve his current health and to decrease his risk of future health problems.  AGREE: Multiple dietary modification options and treatment options were discussed and Dutch agreed to follow the recommendations documented in the above note.  ARRANGE: Zeshan was educated on the importance of frequent visits to treat obesity as outlined per CMS and USPSTF guidelines and agreed to schedule his next follow up appointment today.  I, Kirke Corin, am acting as transcriptionist for Wilder Glade, MD  I have reviewed the above documentation for accuracy and completeness, and I agree with the above. -Quillian Quince, MD

## 2017-12-14 ENCOUNTER — Other Ambulatory Visit: Payer: Self-pay | Admitting: Nurse Practitioner

## 2017-12-14 DIAGNOSIS — G4730 Sleep apnea, unspecified: Principal | ICD-10-CM

## 2017-12-14 DIAGNOSIS — G4710 Hypersomnia, unspecified: Secondary | ICD-10-CM

## 2017-12-14 DIAGNOSIS — F901 Attention-deficit hyperactivity disorder, predominantly hyperactive type: Secondary | ICD-10-CM

## 2017-12-14 DIAGNOSIS — G4733 Obstructive sleep apnea (adult) (pediatric): Secondary | ICD-10-CM

## 2017-12-14 DIAGNOSIS — Z9989 Dependence on other enabling machines and devices: Secondary | ICD-10-CM

## 2017-12-14 NOTE — Telephone Encounter (Signed)
Patient requesting refill of  methylphenidate (RITALIN) 10 MG tablet [facility] [place] [facility] [place] [facility] [place].

## 2017-12-15 MED ORDER — METHYLPHENIDATE HCL 10 MG PO TABS: 10.0000 mg | ORAL_TABLET | Freq: Two times a day (BID) | ORAL | 0 refills | Status: DC

## 2017-12-15 NOTE — Addendum Note (Signed)
Addended by: Hermenia FiscalYOUNG, SANDRA S on: 12/15/2017 11:11 AM   Modules accepted: Orders

## 2017-12-15 NOTE — Addendum Note (Signed)
Addended by: Guy BeginYOUNG, Derricka Mertz S on: 12/15/2017 10:07 AM   Modules accepted: Orders

## 2017-12-15 NOTE — Telephone Encounter (Signed)
Drug registry checked  Ritalin 10mg  tabs #60 last filled 10-07-17.

## 2017-12-16 NOTE — H&P (Signed)
HPI:   [name] is a 52 y.o. male who presents as a consult Patient.   Referring Provider: Darreld Mclean, MD  Chief complaint: Sleep apnea.  HPI: He had a sleep study years ago which was negative. He had another study last year which was positive for his obstructive sleep apnea. He has been on CPAP ever since. He does have snoring and chronic mouth breathing/nasal obstruction. He denies daytime somnolence. He feels exactly the same since starting CPAP as he did prior to the CPAP. He had his tonsils out as a child. He recently was prescribed an oral appliance which based on a home sleep study produced a 50% reduction in apnea. He is wondering if fixing his nose might give him additional benefit. He has history of nasal trauma many years ago. He has not been on any nasal medication. He denies any significant drainage from the nose. The right side is always worse.  PMH/Meds/All/SocHx/FamHx/ROS:   Past Medical History:  Diagnosis Date  . Hypothyroidism  . Low back pain potentially associated with spinal stenosis   Past Surgical History:  Procedure Laterality Date  . KNEE SURGERY  . TONSILLECTOMY   No family history of bleeding disorders, wound healing problems or difficulty with anesthesia.   Social History   Social History  . Marital status: N/A  Spouse name: N/A  . Number of children: N/A  . Years of education: N/A   Occupational History  . Not on file.   Social History Main Topics  . Smoking status: Never Smoker  . Smokeless tobacco: Never Used  . Alcohol use Not on file  . Drug use: Unknown  . Sexual activity: Not on file   Other Topics Concern  . Not on file   Social History Narrative  . No narrative on file   Current Outpatient Prescriptions:  . cloNIDine HCl (CATAPRES) 0.1 MG tablet, TAKE 1 TABLET (0.1 MG TOTAL) AT BEDTIME BY MOUTH., Disp: , Rfl: 11 . dexmethylphenidate (FOCALIN XR) 20 MG extended-release capsule, TAKE ONE CAPSULE BY MOUTH EVERY DAY,  Disp: , Rfl: 0 . HUMIRA PEN 40 mg/0.8 mL injection (pen kit), , Disp: , Rfl:  . levothyroxine (SYNTHROID, LEVOTHROID) 50 MCG tablet, TAKE 1 TABLET BY MOUTH ONCE A DAY, Disp: , Rfl: 3 . meloxicam (MOBIC) 15 MG tablet, TAKE 1 TABLET BY MOUTH EVERY DAY AS NEEDED, Disp: , Rfl: 1 . methocarbamol (ROBAXIN) 750 MG tablet, TAKE 1/2-1 TABLET 3 TIMES PER DAY AS NEEDED FOR BACK SPASMS ORALLY, Disp: , Rfl: 0 . traMADol (ULTRAM) 50 mg tablet, TAKE 1 TABLET AS NEEDED EVERY 6 HRS ORALLY 90 DAYS, Disp: , Rfl: 0  A complete ROS was performed with pertinent positives/negatives noted in the HPI. The remainder of the ROS are negative.   Physical Exam:   Ht 1.829 m (6')  Wt 115.7 kg (255 lb)  BMI 34.58 kg/m   General: Healthy and alert, in no distress, breathing easily. Normal affect. In a pleasant mood. Head: Normocephalic, atraumatic. No masses, or scars. Eyes: Pupils are equal, and reactive to light. Vision is grossly intact. No spontaneous or gaze nystagmus. Ears: Ear canals are clear. Tympanic membranes are intact, with normal landmarks and the middle ears are clear and healthy. Hearing: Grossly normal. Nose: Significant rightward nasal septal deviation. Mucosal looks healthy. There is no polyps or exudate. Turbinates are enlarged. Face: No masses or scars, facial nerve function is symmetric. Oral Cavity: No mucosal abnormalities are noted. Tongue with normal mobility. Dentition appears healthy.  Oropharynx: Tonsils are symmetric. There are no mucosal masses identified. Tongue base appears normal and healthy. Larynx/Hypopharynx: deferred Chest: Deferred Neck: No palpable masses, no cervical adenopathy, no thyroid nodules or enlargement. Neuro: Cranial nerves II-XII will normal function. Balance: Normal gate. Other findings: none.  Independent Review of Additional Tests or Records:  none  Procedures:  none  Impression & Plans:  Obstructive sleep apnea, on CPAP, no noticeable change in any of  his symptoms. He does suffer with chronic nasal obstruction. I think nasal septal/turbinate surgery can provide benefit for his nasal breathing. Whether or not this translates to reduced snoring or sleep apnea only time will tell. He has upcoming back surgery sometime in the next few weeks. He may want to do nasal surgery next year. He can call us any time to schedule that. Risks and benefits were discussed. All questions were answered.    

## 2017-12-21 ENCOUNTER — Other Ambulatory Visit: Payer: Self-pay

## 2017-12-21 ENCOUNTER — Ambulatory Visit (INDEPENDENT_AMBULATORY_CARE_PROVIDER_SITE_OTHER): Payer: 59 | Admitting: Family Medicine

## 2017-12-21 ENCOUNTER — Encounter (INDEPENDENT_AMBULATORY_CARE_PROVIDER_SITE_OTHER): Payer: Self-pay | Admitting: Family Medicine

## 2017-12-21 ENCOUNTER — Encounter (HOSPITAL_BASED_OUTPATIENT_CLINIC_OR_DEPARTMENT_OTHER): Payer: Self-pay | Admitting: *Deleted

## 2017-12-21 VITALS — BP 137/80 | HR 63 | Temp 97.9°F | Ht 72.0 in | Wt 224.0 lb

## 2017-12-21 DIAGNOSIS — E669 Obesity, unspecified: Secondary | ICD-10-CM | POA: Diagnosis not present

## 2017-12-21 DIAGNOSIS — Z9189 Other specified personal risk factors, not elsewhere classified: Secondary | ICD-10-CM

## 2017-12-21 DIAGNOSIS — E559 Vitamin D deficiency, unspecified: Secondary | ICD-10-CM

## 2017-12-21 DIAGNOSIS — Z6830 Body mass index (BMI) 30.0-30.9, adult: Secondary | ICD-10-CM | POA: Diagnosis not present

## 2017-12-21 DIAGNOSIS — E038 Other specified hypothyroidism: Secondary | ICD-10-CM

## 2017-12-21 MED ORDER — VITAMIN D (ERGOCALCIFEROL) 1.25 MG (50000 UNIT) PO CAPS: 50000.0000 [IU] | ORAL_CAPSULE | ORAL | 0 refills | Status: DC

## 2017-12-21 MED ORDER — LEVOTHYROXINE SODIUM 75 MCG PO TABS: 75.0000 ug | ORAL_TABLET | Freq: Every day | ORAL | 0 refills | Status: DC

## 2017-12-22 NOTE — Progress Notes (Signed)
Office: [phone]  /  Fax: [phone]   HPI:   Chief Complaint: OBESITY [name] is here to discuss his progress with his obesity treatment plan. He is on the Category 3 plan and is following his eating plan approximately 80 % of the time. He states he is walking 5 hours a day at [employer]. [name] has done well with weight loss in the last month. 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  His weight is 224 lb (101.6 kg) today and has had a weight loss of 7 pounds over a period of 4 weeks since his last visit. He has lost 29 lbs since starting treatment with [name].  Hypothyroidism [name] has a diagnosis of hypothyroidism. He is stable on levothyroxine and blood pressure is stable. He denies hot or cold intolerance or palpitations.  At risk for cardiovascular disease [name] is at a higher than average risk for cardiovascular disease due to obesity and hypothyroidism. He currently denies any chest pain.  Vitamin D Deficiency [name] has a diagnosis of vitamin D deficiency. He is stable on prescription Vit D and denies nausea, vomiting or muscle weakness.  ALLERGIES: No Known Allergies  MEDICATIONS: Current Outpatient Medications on File Prior to Visit  Medication Sig Dispense Refill  . HUMIRA PEN 40 MG/0.8ML PNKT Inject 40 mg into the skin See admin instructions. Twice monthly (1st & 15th)  1  . meloxicam (MOBIC) 15 MG tablet Take 15 mg by mouth daily as needed.     . methocarbamol (ROBAXIN) 750 MG tablet TAKE 1 TABLET DAILY PRN  2  . methylphenidate (RITALIN) 10 MG tablet Take 1 tablet (10 mg total) by mouth 2 (two) times daily. 60 tablet 0  . traMADol (ULTRAM) 50 MG tablet Take by mouth every 6 (six) hours as needed.     Current Facility-Administered Medications on File Prior to Visit  Medication Dose Route Frequency Provider Last Rate Last Dose  . gadopentetate dimeglumine (MAGNEVIST) injection 20 mL  20 mL Intravenous Once PRN Dohmeier, Porfirio Mylar,  MD        PAST MEDICAL HISTORY: Past Medical History:  Diagnosis Date  . ADD (attention deficit disorder)   . Adult night terrors 03/29/2014  . Alcohol abuse   . Ankylosing spondylitis (HCC)    anklosing spondolitis  . Anxiety   . Asthma    EXERCISE INDUCED  RARELY  USES INHALER  . Back pain   . Depression   . Drug use   . Fatty liver   . Hypothyroidism   . IBS (irritable bowel syndrome)   . Joint pain   . Night terrors   . Sleep apnea    SLEEPS W/ MOUTH GUARD     PAST SURGICAL HISTORY: Past Surgical History:  Procedure Laterality Date  . KNEE SURGERY Left   . LUMBAR LAMINECTOMY/DECOMPRESSION MICRODISCECTOMY N/A 01/14/2017   Procedure: LUMBAR 3-4 DECOMPRESSION;  Surgeon: Estill Bamberg, MD;  Location: [facility] OR;  Service: Orthopedics;  Laterality: N/A;  LUMBAR 2-4 DECOMPRESSION / 2 HRS FOR CASE.  . TONSILLECTOMY AND ADENOIDECTOMY  1992  . WISDOM TOOTH EXTRACTION      SOCIAL HISTORY: Social History   Tobacco Use  . Smoking status: Former Smoker    Years: 4.00    Types: Cigarettes    Last attempt to quit: 01/16/1986    Years since quitting: 31.9  . Smokeless tobacco: Never Used  Substance Use Topics  . Alcohol use: Yes    Alcohol/week: 0.0  standard drinks    Comment: Two drinks daily  . Drug use: Yes    Types: Marijuana    Comment: last used 12-20-17    FAMILY HISTORY: Family History  Problem Relation Age of Onset  . Aneurysm Mother   . Sudden death Mother   . Stroke Father   . Hypertension Father   . Thyroid disease Father   . Alcoholism Father   . Aneurysm Other        brain    ROS: Review of Systems  Constitutional: Positive for weight loss.  Cardiovascular: Negative for chest pain and palpitations.  Gastrointestinal: Negative for nausea and vomiting.  Musculoskeletal:       Negative muscle weakness  Endo/Heme/Allergies:       Negative hot/cold intolerance    PHYSICAL EXAM: Blood pressure 137/80, pulse 63, temperature 97.9 F (36.6 C),  temperature source Oral, height 6' (1.829 m), weight 224 lb (101.6 kg), SpO2 97 %. Body mass index is 30.38 kg/m. Physical Exam  Constitutional: He is oriented to person, place, and time. He appears well-developed and well-nourished.  Cardiovascular: Normal rate.  Pulmonary/Chest: Effort normal.  Musculoskeletal: Normal range of motion.  Neurological: He is oriented to person, place, and time.  Skin: Skin is warm and dry.  Psychiatric: He has a normal mood and affect. His behavior is normal.  Vitals reviewed.   RECENT LABS AND TESTS: BMET    Component Value Date/Time   NA 143 11/02/2017 1408   [name] 4.3 11/02/2017 1408   CL 104 11/02/2017 1408   CO2 24 11/02/2017 1408   GLUCOSE 100 (H) 11/02/2017 1408   GLUCOSE 124 (H) 01/09/2017 1231   BUN 18 11/02/2017 1408   CREATININE 0.92 11/02/2017 1408   CALCIUM 9.2 11/02/2017 1408   GFRNONAA 95 11/02/2017 1408   GFRAA 110 11/02/2017 1408   Lab Results  Component Value Date   HGBA1C 5.2 11/02/2017   HGBA1C 5.1 06/22/2017   HGBA1C 5.3 02/16/2017   Lab Results  Component Value Date   INSULIN 8.8 11/02/2017   INSULIN 8.0 06/22/2017   INSULIN 10.0 02/16/2017   CBC    Component Value Date/Time   WBC 4.7 06/22/2017 1048   WBC 5.5 01/09/2017 1231   RBC 5.55 06/22/2017 1048   RBC 5.50 01/09/2017 1231   HGB 15.5 06/22/2017 1048   HCT 46.5 06/22/2017 1048   PLT 173 01/09/2017 1231   MCV 84 06/22/2017 1048   MCH 27.9 06/22/2017 1048   MCH 27.6 01/09/2017 1231   MCHC 33.3 06/22/2017 1048   MCHC 33.1 01/09/2017 1231   RDW 14.5 06/22/2017 1048   LYMPHSABS 1.6 06/22/2017 1048   MONOABS 0.4 01/09/2017 1231   EOSABS 0.1 06/22/2017 1048   BASOSABS 0.0 06/22/2017 1048   Iron/TIBC/Ferritin/ %Sat No results found for: IRON, TIBC, FERRITIN, IRONPCTSAT Lipid Panel     Component Value Date/Time   CHOL 162 06/22/2017 1048   TRIG 93 06/22/2017 1048   HDL 49 06/22/2017 1048   LDLCALC 94 06/22/2017 1048   Hepatic Function Panel       Component Value Date/Time   PROT 7.1 11/02/2017 1408   ALBUMIN 4.5 11/02/2017 1408   AST 27 11/02/2017 1408   ALT 35 11/02/2017 1408   ALKPHOS 50 11/02/2017 1408   BILITOT 0.6 11/02/2017 1408      Component Value Date/Time   TSH 4.000 06/22/2017 1048   TSH 3.430 02/16/2017 1142  Results for Theadora RamaDUMOND, Bland J (MRN [id number]) as of 12/22/2017 13:49  Ref. Range 11/02/2017 14:08  Vitamin D, 25-Hydroxy Latest Ref Range: 30.0 - 100.0 ng/mL 47.6    ASSESSMENT AND PLAN: Vitamin D deficiency - Plan: Vitamin D, Ergocalciferol, (DRISDOL) 1.25 MG (50000 UT) CAPS capsule  Other specified hypothyroidism - Plan: levothyroxine (SYNTHROID, LEVOTHROID) 75 MCG tablet  At risk for heart disease  Class 1 obesity with serious comorbidity and body mass index (BMI) of 30.0 to 30.9 in adult, unspecified obesity type  PLAN:  Hypothyroidism [name] was informed of the importance of good thyroid control to help with weight loss efforts. He was also informed that supertheraputic thyroid levels are dangerous and will not improve weight loss results. [name] agrees to continue taking levothyroxine 75 mcg q AM #30 and we will refill for 1 month. [name] agrees to follow up with our clinic in 4 weeks.  Cardiovascular risk counselling [name] was given extended (15 minutes) coronary artery disease prevention counseling today. He is 52 y.o. male and has risk factors for heart disease including obesity and hypothyroidism. We discussed intensive lifestyle modifications today with an emphasis on specific weight loss instructions and strategies. Pt was also informed of the importance of increasing exercise and decreasing saturated fats to help prevent heart disease.  Vitamin D Deficiency [name] was informed that low vitamin D levels contributes to fatigue and are associated with obesity, breast, and colon cancer. [name] agrees to continue taking prescription Vit D @50 ,000 IU every week #4 and we will refill for 1 month. He  will follow up for routine testing of vitamin D, at least 2-3 times per year. He was informed of the risk of over-replacement of vitamin D and agrees to not increase his dose unless he discusses this with [name] first. [name] agrees to follow up with our clinic in 4 weeks.  Obesity [name] is currently in the action stage of change. As such, his goal is to continue with weight loss efforts He has agreed to follow the Category 3 plan [name] has been instructed to work up to a goal of 150 minutes of combined cardio and strengthening exercise per week or as is for weight loss and overall health benefits. We discussed the following Behavioral Modification Strategies today: increasing lean protein intake and decreasing simple carbohydrates    V[place]aan has agreed to follow up with our clinic in 4 weeks. He was informed of the importance of frequent follow up visits to maximize his success with intensive lifestyle modifications for his multiple health conditions.   OBESITY BEHAVIORAL INTERVENTION VISIT  Today's visit was # 14   Starting weight: 02/16/17 Starting date: 253 lbs Today's weight : 224 lbs Today's date: 12/21/2017 Total lbs lost to date: 74    ASK: We discussed the diagnosis of obesity with [name] today and [name] agreed to give [name] permission to discuss obesity behavioral modification therapy today.  ASSESS: Urbano has the diagnosis of obesity and his BMI today is 30.37 Titan is in the action stage of change   ADVISE: Hayden was educated on the multiple health risks of obesity as well as the benefit of weight loss to improve his health. He was advised of the need for long term treatment and the importance of lifestyle modifications to improve his current health and to decrease his risk of future health problems.  AGREE: Multiple dietary modification options and treatment options were discussed and  Taelon agreed to follow the recommendations documented in the above  note.  ARRANGE: Lois was educated on the importance of frequent visits  to treat obesity as outlined per CMS and USPSTF guidelines and agreed to schedule his next follow up appointment today.  I, Burt Knack, am acting as transcriptionist for Quillian Quince, MD  I have reviewed the above documentation for accuracy and completeness, and I agree with the above. -Quillian Quince, MD

## 2017-12-27 ENCOUNTER — Other Ambulatory Visit (INDEPENDENT_AMBULATORY_CARE_PROVIDER_SITE_OTHER): Payer: Self-pay | Admitting: Family Medicine

## 2017-12-27 DIAGNOSIS — E559 Vitamin D deficiency, unspecified: Secondary | ICD-10-CM

## 2017-12-28 ENCOUNTER — Ambulatory Visit (HOSPITAL_BASED_OUTPATIENT_CLINIC_OR_DEPARTMENT_OTHER): Payer: 59 | Admitting: Anesthesiology

## 2017-12-28 ENCOUNTER — Ambulatory Visit (HOSPITAL_BASED_OUTPATIENT_CLINIC_OR_DEPARTMENT_OTHER)
Admission: RE | Admit: 2017-12-28 | Discharge: 2017-12-28 | Disposition: A | Discharge status: Home / Self Care | Payer: 59 | Attending: Otolaryngology | Admitting: Otolaryngology

## 2017-12-28 ENCOUNTER — Encounter (HOSPITAL_BASED_OUTPATIENT_CLINIC_OR_DEPARTMENT_OTHER)
Admission: RE | Disposition: A | Discharge status: Home / Self Care | Payer: Self-pay | Source: Home / Self Care | Attending: Otolaryngology

## 2017-12-28 ENCOUNTER — Other Ambulatory Visit: Payer: Self-pay

## 2017-12-28 ENCOUNTER — Encounter (HOSPITAL_BASED_OUTPATIENT_CLINIC_OR_DEPARTMENT_OTHER): Payer: Self-pay

## 2017-12-28 DIAGNOSIS — Z794 Long term (current) use of insulin: Secondary | ICD-10-CM | POA: Diagnosis not present

## 2017-12-28 DIAGNOSIS — E039 Hypothyroidism, unspecified: Secondary | ICD-10-CM | POA: Diagnosis not present

## 2017-12-28 DIAGNOSIS — Z87891 Personal history of nicotine dependence: Secondary | ICD-10-CM | POA: Insufficient documentation

## 2017-12-28 DIAGNOSIS — J342 Deviated nasal septum: Secondary | ICD-10-CM | POA: Insufficient documentation

## 2017-12-28 DIAGNOSIS — E119 Type 2 diabetes mellitus without complications: Secondary | ICD-10-CM | POA: Insufficient documentation

## 2017-12-28 DIAGNOSIS — Z79899 Other long term (current) drug therapy: Secondary | ICD-10-CM | POA: Diagnosis not present

## 2017-12-28 DIAGNOSIS — G4733 Obstructive sleep apnea (adult) (pediatric): Secondary | ICD-10-CM | POA: Diagnosis not present

## 2017-12-28 HISTORY — PX: NASAL SEPTOPLASTY W/ TURBINOPLASTY: SHX2070

## 2017-12-28 SURGERY — SEPTOPLASTY, NOSE, WITH NASAL TURBINATE REDUCTION
Anesthesia: General | Site: Nose | Laterality: Bilateral

## 2017-12-28 MED ORDER — LACTATED RINGERS IV SOLN
INTRAVENOUS | Status: DC
  Administered 2017-12-28 (×2): via INTRAVENOUS

## 2017-12-28 MED ORDER — ROCURONIUM BROMIDE 100 MG/10ML IV SOLN
INTRAVENOUS | Status: DC | PRN
  Administered 2017-12-28: 40 mg via INTRAVENOUS

## 2017-12-28 MED ORDER — DEXAMETHASONE SODIUM PHOSPHATE 10 MG/ML IJ SOLN
INTRAMUSCULAR | Status: AC
  Filled 2017-12-28: qty 1

## 2017-12-28 MED ORDER — PROPOFOL 10 MG/ML IV BOLUS
INTRAVENOUS | Status: AC
  Filled 2017-12-28: qty 20

## 2017-12-28 MED ORDER — OXYMETAZOLINE HCL 0.05 % NA SOLN
2.0000 | NASAL | Status: AC
  Administered 2017-12-28 (×3): 2 via NASAL

## 2017-12-28 MED ORDER — OXYCODONE HCL 5 MG PO TABS
5.0000 mg | ORAL_TABLET | Freq: Once | ORAL | Status: AC | PRN
  Administered 2017-12-28: 5 mg via ORAL

## 2017-12-28 MED ORDER — FENTANYL CITRATE (PF) 100 MCG/2ML IJ SOLN
INTRAMUSCULAR | Status: AC
  Filled 2017-12-28: qty 2

## 2017-12-28 MED ORDER — GLYCOPYRROLATE PF 0.2 MG/ML IJ SOSY
PREFILLED_SYRINGE | INTRAMUSCULAR | Status: AC
  Filled 2017-12-28: qty 2

## 2017-12-28 MED ORDER — LIDOCAINE-EPINEPHRINE 1 %-1:100000 IJ SOLN
INTRAMUSCULAR | Status: DC | PRN
  Administered 2017-12-28: 5 mL

## 2017-12-28 MED ORDER — PROMETHAZINE HCL 25 MG/ML IJ SOLN: 6.2500 mg | INTRAMUSCULAR | Status: DC | PRN

## 2017-12-28 MED ORDER — LIDOCAINE HCL (CARDIAC) PF 100 MG/5ML IV SOSY
PREFILLED_SYRINGE | INTRAVENOUS | Status: DC | PRN
  Administered 2017-12-28: 100 mg via INTRAVENOUS

## 2017-12-28 MED ORDER — PROMETHAZINE HCL 25 MG RE SUPP: 25.0000 mg | Freq: Four times a day (QID) | RECTAL | 1 refills | Status: DC | PRN

## 2017-12-28 MED ORDER — OXYCODONE HCL 5 MG/5ML PO SOLN: 5.0000 mg | Freq: Once | ORAL | Status: AC | PRN

## 2017-12-28 MED ORDER — SUGAMMADEX SODIUM 200 MG/2ML IV SOLN
INTRAVENOUS | Status: DC | PRN
  Administered 2017-12-28: 200 mg via INTRAVENOUS

## 2017-12-28 MED ORDER — GLYCOPYRROLATE 0.2 MG/ML IJ SOLN
INTRAMUSCULAR | Status: DC | PRN
  Administered 2017-12-28: 0.1 mg via INTRAVENOUS

## 2017-12-28 MED ORDER — ONDANSETRON HCL 4 MG/2ML IJ SOLN
INTRAMUSCULAR | Status: AC
  Filled 2017-12-28: qty 2

## 2017-12-28 MED ORDER — SUGAMMADEX SODIUM 200 MG/2ML IV SOLN
INTRAVENOUS | Status: AC
  Filled 2017-12-28: qty 2

## 2017-12-28 MED ORDER — MIDAZOLAM HCL 2 MG/2ML IJ SOLN: 1.0000 mg | INTRAMUSCULAR | Status: DC | PRN

## 2017-12-28 MED ORDER — BACITRACIN ZINC 500 UNIT/GM EX OINT
TOPICAL_OINTMENT | CUTANEOUS | Status: AC
  Filled 2017-12-28: qty 28.35

## 2017-12-28 MED ORDER — SCOPOLAMINE 1 MG/3DAYS TD PT72: 1.0000 | MEDICATED_PATCH | Freq: Once | TRANSDERMAL | Status: DC | PRN

## 2017-12-28 MED ORDER — FENTANYL CITRATE (PF) 100 MCG/2ML IJ SOLN
50.0000 ug | INTRAMUSCULAR | Status: DC | PRN
  Administered 2017-12-28: 100 ug via INTRAVENOUS

## 2017-12-28 MED ORDER — OXYCODONE HCL 5 MG PO TABS
ORAL_TABLET | ORAL | Status: AC
  Filled 2017-12-28: qty 1

## 2017-12-28 MED ORDER — OXYMETAZOLINE HCL 0.05 % NA SOLN
NASAL | Status: DC | PRN
  Administered 2017-12-28: 1 via TOPICAL

## 2017-12-28 MED ORDER — FENTANYL CITRATE (PF) 100 MCG/2ML IJ SOLN
25.0000 ug | INTRAMUSCULAR | Status: DC | PRN
  Administered 2017-12-28 (×2): 50 ug via INTRAVENOUS

## 2017-12-28 MED ORDER — OXYMETAZOLINE HCL 0.05 % NA SOLN
NASAL | Status: AC
  Filled 2017-12-28: qty 15

## 2017-12-28 MED ORDER — HYDROCODONE-ACETAMINOPHEN 7.5-325 MG PO TABS: 1.0000 | ORAL_TABLET | Freq: Four times a day (QID) | ORAL | 0 refills | Status: DC | PRN

## 2017-12-28 MED ORDER — PROPOFOL 10 MG/ML IV BOLUS
INTRAVENOUS | Status: DC | PRN
  Administered 2017-12-28: 200 mg via INTRAVENOUS

## 2017-12-28 MED ORDER — ROCURONIUM BROMIDE 50 MG/5ML IV SOSY
PREFILLED_SYRINGE | INTRAVENOUS | Status: AC
  Filled 2017-12-28: qty 5

## 2017-12-28 MED ORDER — DEXAMETHASONE SODIUM PHOSPHATE 4 MG/ML IJ SOLN
INTRAMUSCULAR | Status: DC | PRN
  Administered 2017-12-28: 10 mg via INTRAVENOUS

## 2017-12-28 MED ORDER — MEPERIDINE HCL 25 MG/ML IJ SOLN: 6.2500 mg | INTRAMUSCULAR | Status: DC | PRN

## 2017-12-28 MED ORDER — BACITRACIN ZINC 500 UNIT/GM EX OINT
TOPICAL_OINTMENT | CUTANEOUS | Status: DC | PRN
  Administered 2017-12-28: 1 via TOPICAL

## 2017-12-28 MED ORDER — ONDANSETRON HCL 4 MG/2ML IJ SOLN
INTRAMUSCULAR | Status: DC | PRN
  Administered 2017-12-28: 4 mg via INTRAVENOUS

## 2017-12-28 SURGICAL SUPPLY — 32 items
ATTRACTOMAT 16X20 MAGNETIC DRP (DRAPES) IMPLANT
CANISTER SUCT 1200ML W/VALVE (MISCELLANEOUS) ×2 IMPLANT
COAGULATOR SUCT 8FR VV (MISCELLANEOUS) IMPLANT
COVER WAND RF STERILE (DRAPES) IMPLANT
DECANTER SPIKE VIAL GLASS SM (MISCELLANEOUS) IMPLANT
DRSG NASOPORE 8CM (GAUZE/BANDAGES/DRESSINGS) IMPLANT
DRSG TELFA 3X8 NADH (GAUZE/BANDAGES/DRESSINGS) ×2 IMPLANT
ELECT REM PT RETURN 9FT ADLT (ELECTROSURGICAL) ×2
ELECTRODE REM PT RTRN 9FT ADLT (ELECTROSURGICAL) ×1 IMPLANT
GAUZE 4X4 16PLY RFD (DISPOSABLE) IMPLANT
GLOVE BIO SURGEON STRL SZ7 (GLOVE) ×2 IMPLANT
GLOVE BIOGEL PI IND STRL 7.5 (GLOVE) ×1 IMPLANT
GLOVE BIOGEL PI INDICATOR 7.5 (GLOVE) ×1
GLOVE ECLIPSE 7.5 STRL STRAW (GLOVE) ×2 IMPLANT
GOWN STRL REUS W/ TWL LRG LVL3 (GOWN DISPOSABLE) ×2 IMPLANT
GOWN STRL REUS W/TWL LRG LVL3 (GOWN DISPOSABLE) ×2
HEMOSTAT SURGICEL .5X2 ABSORB (HEMOSTASIS) IMPLANT
NEEDLE PRECISIONGLIDE 27X1.5 (NEEDLE) ×2 IMPLANT
NS IRRIG 1000ML POUR BTL (IV SOLUTION) IMPLANT
PACK BASIN DAY SURGERY FS (CUSTOM PROCEDURE TRAY) ×2 IMPLANT
PACK ENT DAY SURGERY (CUSTOM PROCEDURE TRAY) ×2 IMPLANT
PATTIES SURGICAL .5 X3 (DISPOSABLE) ×2 IMPLANT
SLEEVE SCD COMPRESS KNEE MED (MISCELLANEOUS) ×2 IMPLANT
SPONGE GAUZE 2X2 8PLY STRL LF (GAUZE/BANDAGES/DRESSINGS) ×2 IMPLANT
STRIP CLOSURE SKIN 1/2X4 (GAUZE/BANDAGES/DRESSINGS) IMPLANT
SUT CHROMIC 4 0 P 3 18 (SUTURE) ×2 IMPLANT
SUT ETHILON 3 0 PS 1 (SUTURE) IMPLANT
SUT ETHILON 4 0 CL P 3 (SUTURE) IMPLANT
SUT ETHILON 6 0 P 1 (SUTURE) IMPLANT
SUT PLAIN 4 0 ~~LOC~~ 1 (SUTURE) ×2 IMPLANT
TOWEL GREEN STERILE FF (TOWEL DISPOSABLE) ×2 IMPLANT
YANKAUER SUCT BULB TIP NO VENT (SUCTIONS) ×2 IMPLANT

## 2017-12-28 NOTE — Interval H&P Note (Signed)
History and Physical Interval Note:  12/28/2017 10:47 AM  [name]  has presented today for surgery, with the diagnosis of Deviated Septum  The various methods of treatment have been discussed with the patient and family. After consideration of risks, benefits and other options for treatment, the patient has consented to  Procedure(s): NASAL SEPTOPLASTY WITH TURBINATE REDUCTION (Bilateral) as a surgical intervention .  The patient's history has been reviewed, patient examined, no change in status, stable for surgery.  I have reviewed the patient's chart and labs.  Questions were answered to the patient's satisfaction.     Serena ColonelJefry Yehia Mcbain

## 2017-12-28 NOTE — Transfer of Care (Signed)
Immediate Anesthesia Transfer of Care Note  Patient: [name]  Procedure(s) Performed: NASAL SEPTOPLASTY WITH TURBINATE REDUCTION (Bilateral Nose)  Patient Location: PACU  Anesthesia Type:General  Level of Consciousness: awake and patient cooperative  Airway & Oxygen Therapy: Patient Spontanous Breathing and Patient connected to face mask oxygen  Post-op Assessment: Report given to RN and Post -op Vital signs reviewed and stable  Post vital signs: Reviewed and stable  Last Vitals:  Vitals Value Taken Time  BP    Temp    Pulse 53 12/28/2017 11:59 AM  Resp    SpO2 100 % 12/28/2017 11:59 AM  Vitals shown include unvalidated device data.  Last Pain:  Vitals:   12/28/17 0822  TempSrc: Oral  PainSc: 0-No pain         Complications: No apparent anesthesia complications

## 2017-12-28 NOTE — Anesthesia Procedure Notes (Signed)
Performed by: Burnis Kaser L, CRNA       

## 2017-12-28 NOTE — Op Note (Signed)
OPERATIVE REPORT  DATE OF SURGERY: 12/28/2017  PATIENT:  [name],  52 y.o. male  PRE-OPERATIVE DIAGNOSIS:  Deviated Septum  POST-OPERATIVE DIAGNOSIS:  Deviated Septum  PROCEDURE:  Procedure(s): NASAL SEPTOPLASTY WITH TURBINATE REDUCTION  SURGEON:  Susy FrizzleJefry H Gursimran Litaker, MD  ASSISTANTS: none  ANESTHESIA:   General   EBL:  25 ml  DRAINS: none  LOCAL MEDICATIONS USED:  1% Xylocaine with epinephrine  SPECIMEN:  none  COUNTS:  Correct  PROCEDURE DETAILS: The patient was taken to the operating room and placed on the operating table in the supine position. Following induction of general endotracheal anesthesia, the nose was prepped and draped in a standard fashion. Afrin spray was used preoperatively in the holding area. 1% Xylocaine with epinephrine was infiltrated into the septum, the columella, and the inferior turbinates bilaterally.  1. Nasal septoplasty. A left hemitransfixion incision was created with a 15 scalpel. A mucoperichondrial flap was developed posteriorly down the left side of the nasal septum using a Cottle elevator. This was performed all the way to the sphenoid rostrum. The bony cartilaginous junction was divided and a similar flap was developed down the right side. The ethmoid plate was thickened and contained a S shaped deformity, it was nearly completely resected.  There is a severe rightward deflection of the maxillary crest and vomer which were also resected freeing up that obstruction.  There is a large synechiae between the septal mucosa and the left inferior turbinate that was divided.  2. Submucous resection inferior turbinates bilaterally. The leading edge of the inferior turbinates were incised in a vertical fashion with a #15 scalpel. The Cottle elevator was used to elevate mucosa off bone in all directions. Fragments of the turbinate bone were resected with a Takahashi forceps. The turbinate remnants were outfractured with the Therapist, nutritionalreer elevator.  All of  these maneuvers greatly increased the nasal airways bilaterally. The nasal cavities were packed with rolled up Telfa coated with bacitracin ointment. The pharynx was suctioned of blood and secretions under direct visualization. The patient was awakened extubated and transferred to recovery in stable condition.    PATIENT DISPOSITION:  To PACU, stable

## 2017-12-28 NOTE — Discharge Instructions (Signed)

## 2017-12-28 NOTE — Anesthesia Preprocedure Evaluation (Signed)
Anesthesia Evaluation  Patient identified by MRN, date of birth, ID band Patient awake    Reviewed: Allergy & Precautions, NPO status , Patient's Chart, lab work & pertinent test results  Airway Mallampati: II  TM Distance: >3 FB Neck ROM: Full    Dental  (+) Teeth Intact, Dental Advisory Given   Pulmonary asthma , sleep apnea , former smoker,    breath sounds clear to auscultation       Cardiovascular negative cardio ROS   Rhythm:Regular Rate:Normal     Neuro/Psych  Headaches, PSYCHIATRIC DISORDERS Anxiety Depression  Neuromuscular disease    GI/Hepatic negative GI ROS, Neg liver ROS,   Endo/Other  diabetes, Type 2, Insulin DependentHypothyroidism   Renal/GU negative Renal ROS     Musculoskeletal  (+) Arthritis ,   Abdominal (+) + obese,   Peds  Hematology negative hematology ROS (+)   Anesthesia Other Findings   Reproductive/Obstetrics                             Lab Results  Component Value Date   WBC 4.7 06/22/2017   HGB 15.5 06/22/2017   HCT 46.5 06/22/2017   MCV 84 06/22/2017   PLT 173 01/09/2017   Lab Results  Component Value Date   CREATININE 0.92 11/02/2017   BUN 18 11/02/2017   NA 143 11/02/2017   K 4.3 11/02/2017   CL 104 11/02/2017   CO2 24 11/02/2017   Lab Results  Component Value Date   INR 0.93 01/09/2017   INR 1.10 09/18/2014   EKG: NSR   Anesthesia Physical  Anesthesia Plan  ASA: II  Anesthesia Plan: General   Post-op Pain Management:    Induction: Intravenous  PONV Risk Score and Plan: 4 or greater and Ondansetron, Dexamethasone, Midazolam and Treatment may vary due to age or medical condition  Airway Management Planned: Oral ETT  Additional Equipment: None  Intra-op Plan:   Post-operative Plan: Extubation in OR  Informed Consent: I have reviewed the patients History and Physical, chart, labs and discussed the procedure including the  risks, benefits and alternatives for the proposed anesthesia with the patient or authorized representative who has indicated his/her understanding and acceptance.   Dental advisory given  Plan Discussed with: CRNA  Anesthesia Plan Comments:         Anesthesia Quick Evaluation

## 2017-12-28 NOTE — Anesthesia Procedure Notes (Signed)
Procedure Name: Intubation Date/Time: 12/28/2017 11:13 AM Performed by: Marrianne Mood, CRNA Pre-anesthesia Checklist: Patient identified, Emergency Drugs available, Suction available and Patient being monitored Patient Re-evaluated:Patient Re-evaluated prior to induction Oxygen Delivery Method: Circle system utilized Preoxygenation: Pre-oxygenation with 100% oxygen Induction Type: IV induction Ventilation: Mask ventilation without difficulty Laryngoscope Size: Mac and 3 Grade View: Grade I Tube type: Oral Tube size: 8.0 mm Number of attempts: 1 Airway Equipment and Method: Stylet and Oral airway Placement Confirmation: ETT inserted through vocal cords under direct vision,  positive ETCO2 and breath sounds checked- equal and bilateral Secured at: 18 cm Tube secured with: Tape Dental Injury: Teeth and Oropharynx as per pre-operative assessment

## 2017-12-29 ENCOUNTER — Encounter (HOSPITAL_BASED_OUTPATIENT_CLINIC_OR_DEPARTMENT_OTHER): Payer: Self-pay | Admitting: Otolaryngology

## 2017-12-29 NOTE — Anesthesia Postprocedure Evaluation (Signed)
Anesthesia Post Note  Patient: [name]  Procedure(s) Performed: NASAL SEPTOPLASTY WITH TURBINATE REDUCTION (Bilateral Nose)     Patient location during evaluation: PACU Anesthesia Type: General Level of consciousness: sedated and patient cooperative Pain management: pain level controlled Vital Signs Assessment: post-procedure vital signs reviewed and stable Respiratory status: spontaneous breathing Cardiovascular status: stable Anesthetic complications: no    Last Vitals:  Vitals:   12/28/17 1245 12/28/17 1320  BP:  (!) 146/79  Pulse:    Resp: 16 18  Temp:  36.7 C  SpO2: 100% 100%    Last Pain:  Vitals:   12/28/17 1320  TempSrc:   PainSc: 3                  Lewie LoronJohn Bev Drennen

## 2018-01-04 ENCOUNTER — Encounter: Payer: Self-pay | Admitting: Nurse Practitioner

## 2018-01-17 ENCOUNTER — Other Ambulatory Visit (INDEPENDENT_AMBULATORY_CARE_PROVIDER_SITE_OTHER): Payer: Self-pay | Admitting: Family Medicine

## 2018-01-17 DIAGNOSIS — E559 Vitamin D deficiency, unspecified: Secondary | ICD-10-CM

## 2018-01-18 ENCOUNTER — Ambulatory Visit (INDEPENDENT_AMBULATORY_CARE_PROVIDER_SITE_OTHER): Payer: Managed Care, Other (non HMO) | Admitting: Family Medicine

## 2018-01-18 ENCOUNTER — Encounter (INDEPENDENT_AMBULATORY_CARE_PROVIDER_SITE_OTHER): Payer: Self-pay | Admitting: Family Medicine

## 2018-01-18 VITALS — BP 142/80 | HR 66 | Temp 97.7°F | Ht 72.0 in | Wt 224.0 lb

## 2018-01-18 DIAGNOSIS — E669 Obesity, unspecified: Secondary | ICD-10-CM

## 2018-01-18 DIAGNOSIS — Z6830 Body mass index (BMI) 30.0-30.9, adult: Secondary | ICD-10-CM | POA: Diagnosis not present

## 2018-01-18 DIAGNOSIS — R030 Elevated blood-pressure reading, without diagnosis of hypertension: Secondary | ICD-10-CM | POA: Diagnosis not present

## 2018-01-18 NOTE — Progress Notes (Signed)
Office: [phone]  /  Fax: [phone]   HPI:   Chief Complaint: OBESITY [name] is here to discuss his progress with his obesity treatment plan. He is on the Category 3 plan and is following his eating plan approximately 80 % of the time. He states he is exercising 0 minutes 0 times per week. [name] did very well maintaining his weight over the holidays and avoiding holiday weight gain. He has made a New Year's resolution to avoid alcohol in 2020 and is feeling good about his diet.  His weight is 224 lb (101.6 kg) today and has not lost weight since his last visit. He has lost 29 lbs since starting treatment with [name].  Elevated Blood Pressure without History of Hypertension [name] is not on medications, but his blood pressure has started to increase since starting his new diner this fall with increased sodium intake, as well as decreased exercise.  ASSESSMENT AND PLAN:  Blood pressure elevated without history of HTN  Class 1 obesity with serious comorbidity and body mass index (BMI) of 30.0 to 30.9 in adult, unspecified obesity type  PLAN:  Elevated Blood Pressure without History of Hypertension [name] agrees to watch his sodium intake and he plans to restart exercise. [name] agrees to follow up in 4 weeks.  I spent > than 50% of the 25 minute visit on counseling as documented in the note.  Obesity [name] is currently in the action stage of change. As such, his goal is to continue with weight loss efforts. He has agreed to follow the Category 3 plan. [name] has been instructed to work up to a goal of 150 minutes of combined cardio and strengthening exercise per week for weight loss and overall health benefits. We discussed the following Behavioral Modification Strategies today: increasing lean protein intake, decreasing simple carbohydrates, and work on meal planning and easy cooking plans.  [name] has agreed to follow up with our clinic in 4 weeks. He was informed of the  importance of frequent follow up visits to maximize his success with intensive lifestyle modifications for his multiple health conditions.  ALLERGIES: No Known Allergies  MEDICATIONS: Current Outpatient Medications on File Prior to Visit  Medication Sig Dispense Refill  . HUMIRA PEN 40 MG/0.8ML PNKT Inject 40 mg into the skin See admin instructions. Twice monthly (1st & 15th)  1  . HYDROcodone-acetaminophen (NORCO) 7.5-325 MG tablet Take 1 tablet by mouth every 6 (six) hours as needed for moderate pain. 20 tablet 0  . levothyroxine (SYNTHROID, LEVOTHROID) 75 MCG tablet Take 1 tablet (75 mcg total) by mouth daily before breakfast. 30 tablet 0  . meloxicam (MOBIC) 15 MG tablet Take 15 mg by mouth daily as needed.     . methocarbamol (ROBAXIN) 750 MG tablet TAKE 1 TABLET DAILY PRN  2  . methylphenidate (RITALIN) 10 MG tablet Take 1 tablet (10 mg total) by mouth 2 (two) times daily. 60 tablet 0  . promethazine (PHENERGAN) 25 MG suppository Place 1 suppository (25 mg total) rectally every 6 (six) hours as needed for nausea or vomiting. 12 suppository 1  . traMADol (ULTRAM) 50 MG tablet Take by mouth every 6 (six) hours as needed.    . Vitamin D, Ergocalciferol, (DRISDOL) 1.25 MG (50000 UT) CAPS capsule Take 1 capsule (50,000 Units total) by mouth every 7 (seven) days. 4 capsule 0   Current Facility-Administered Medications on File Prior to Visit  Medication Dose Route Frequency Provider Last Rate Last Dose  . gadopentetate dimeglumine (MAGNEVIST)  injection 20 mL  20 mL Intravenous Once PRN Dohmeier, Porfirio Mylar, MD        PAST MEDICAL HISTORY: Past Medical History:  Diagnosis Date  . ADD (attention deficit disorder)   . Adult night terrors 03/29/2014  . Alcohol abuse   . Ankylosing spondylitis (HCC)    anklosing spondolitis  . Anxiety   . Asthma    EXERCISE INDUCED  RARELY  USES INHALER  . Back pain   . Depression   . Drug use   . Fatty liver   . Hypothyroidism   . IBS (irritable bowel  syndrome)   . Joint pain   . Night terrors   . Sleep apnea    SLEEPS W/ MOUTH GUARD     PAST SURGICAL HISTORY: Past Surgical History:  Procedure Laterality Date  . KNEE SURGERY Left   . LUMBAR LAMINECTOMY/DECOMPRESSION MICRODISCECTOMY N/A 01/14/2017   Procedure: LUMBAR 3-4 DECOMPRESSION;  Surgeon: Estill Bamberg, MD;  Location: [facility];  Service: Orthopedics;  Laterality: N/A;  LUMBAR 2-4 DECOMPRESSION / 2 HRS FOR CASE.  Marland Kitchen NASAL SEPTOPLASTY W/ TURBINOPLASTY Bilateral 12/28/2017   Procedure: NASAL SEPTOPLASTY WITH TURBINATE REDUCTION;  Surgeon: Serena Colonel, MD;  Location: [facility];  Service: ENT;  Laterality: Bilateral;  . TONSILLECTOMY AND ADENOIDECTOMY  1992  . WISDOM TOOTH EXTRACTION      SOCIAL HISTORY: Social History   Tobacco Use  . Smoking status: Former Smoker    Years: 4.00    Types: Cigarettes    Last attempt to quit: 01/16/1986    Years since quitting: 32.0  . Smokeless tobacco: Never Used  Substance Use Topics  . Alcohol use: Yes    Alcohol/week: 0.0 standard drinks    Comment: Two drinks daily  . Drug use: Yes    Types: Marijuana    Comment: last used 12/27/17    FAMILY HISTORY: Family History  Problem Relation Age of Onset  . Aneurysm Mother   . Sudden death Mother   . Stroke Father   . Hypertension Father   . Thyroid disease Father   . Alcoholism Father   . Aneurysm Other        brain   ROS: Review of Systems  Constitutional: Negative for weight loss.   PHYSICAL EXAM: Blood pressure (!) 142/80, pulse 66, temperature 97.7 F (36.5 C), temperature source Oral, height 6' (1.829 m), weight 224 lb (101.6 kg), SpO2 99 %. Body mass index is 30.38 kg/m. Physical Exam Vitals signs reviewed.  Constitutional:      Appearance: Normal appearance. He is obese.  Cardiovascular:     Rate and Rhythm: Normal rate.  Pulmonary:     Effort: Pulmonary effort is normal.  Musculoskeletal: Normal range of motion.  Skin:    General: Skin is warm  and dry.  Neurological:     Mental Status: He is alert and oriented to person, place, and time.  Psychiatric:        Mood and Affect: Mood normal.        Behavior: Behavior normal.    RECENT LABS AND TESTS: BMET    Component Value Date/Time   NA 143 11/02/2017 1408   K 4.3 11/02/2017 1408   CL 104 11/02/2017 1408   CO2 24 11/02/2017 1408   GLUCOSE 100 (H) 11/02/2017 1408   GLUCOSE 124 (H) 01/09/2017 1231   BUN 18 11/02/2017 1408   CREATININE 0.92 11/02/2017 1408   CALCIUM 9.2 11/02/2017 1408   GFRNONAA 95 11/02/2017 1408  GFRAA 110 11/02/2017 1408   Lab Results  Component Value Date   HGBA1C 5.2 11/02/2017   HGBA1C 5.1 06/22/2017   HGBA1C 5.3 02/16/2017   Lab Results  Component Value Date   INSULIN 8.8 11/02/2017   INSULIN 8.0 06/22/2017   INSULIN 10.0 02/16/2017   CBC    Component Value Date/Time   WBC 4.7 06/22/2017 1048   WBC 5.5 01/09/2017 1231   RBC 5.55 06/22/2017 1048   RBC 5.50 01/09/2017 1231   HGB 15.5 06/22/2017 1048   HCT 46.5 06/22/2017 1048   PLT 173 01/09/2017 1231   MCV 84 06/22/2017 1048   MCH 27.9 06/22/2017 1048   MCH 27.6 01/09/2017 1231   MCHC 33.3 06/22/2017 1048   MCHC 33.1 01/09/2017 1231   RDW 14.5 06/22/2017 1048   LYMPHSABS 1.6 06/22/2017 1048   MONOABS 0.4 01/09/2017 1231   EOSABS 0.1 06/22/2017 1048   BASOSABS 0.0 06/22/2017 1048   Iron/TIBC/Ferritin/ %Sat No results found for: IRON, TIBC, FERRITIN, IRONPCTSAT Lipid Panel     Component Value Date/Time   CHOL 162 06/22/2017 1048   TRIG 93 06/22/2017 1048   HDL 49 06/22/2017 1048   LDLCALC 94 06/22/2017 1048   Hepatic Function Panel     Component Value Date/Time   PROT 7.1 11/02/2017 1408   ALBUMIN 4.5 11/02/2017 1408   AST 27 11/02/2017 1408   ALT 35 11/02/2017 1408   ALKPHOS 50 11/02/2017 1408   BILITOT 0.6 11/02/2017 1408      Component Value Date/Time   TSH 4.000 06/22/2017 1048   TSH 3.430 02/16/2017 1142   Results for Theadora RamaDUMOND, Jaime J (MRN [id number])  as of 01/18/2018 15:22  Ref. Range 11/02/2017 14:08  Vitamin D, 25-Hydroxy Latest Ref Range: 30.0 - 100.0 ng/mL 47.6    OBESITY BEHAVIORAL INTERVENTION VISIT  Today's visit was # 15   Starting weight: 253 lbs Starting date: 02/16/17 Today's weight : Weight: 224 lb (101.6 kg)  Today's date: 01/18/2018 Total lbs lost to date: 3229  ASK: We discussed the diagnosis of obesity with Saahil J Speedy today and Khylon agreed to give us permission to discuss obesity behavioral modification therapy today.  ASSESS: Carmen has the diagnosis of obesity and his BMI today is 30.3. Bruk is in the action stage of change.   ADVISE: Dwaine DeterDarold was educated on the multiple health risks of obesity as well as the benefit of weight loss to improve his health. He was advised of the need for long term treatment and the importance of lifestyle modifications to improve his current health and to decrease his risk of future health problems.  AGREE: Multiple dietary modification options and treatment options were discussed and Jovon agreed to follow the recommendations documented in the above note.  ARRANGE: Trypp was educated on the importance of frequent visits to treat obesity as outlined per CMS and USPSTF guidelines and agreed to schedule his next follow up appointment today.  I, Kirke Corinara Soares, am acting as transcriptionist for Wilder Gladearen D. Beasley, MD I have reviewed the above documentation for accuracy and completeness, and I agree with the above. -Quillian Quincearen Beasley, MD

## 2018-02-11 ENCOUNTER — Telehealth: Payer: Self-pay | Admitting: Nurse Practitioner

## 2018-02-11 DIAGNOSIS — F901 Attention-deficit hyperactivity disorder, predominantly hyperactive type: Secondary | ICD-10-CM

## 2018-02-11 DIAGNOSIS — Z9989 Dependence on other enabling machines and devices: Secondary | ICD-10-CM

## 2018-02-11 DIAGNOSIS — G4733 Obstructive sleep apnea (adult) (pediatric): Secondary | ICD-10-CM

## 2018-02-11 DIAGNOSIS — G4710 Hypersomnia, unspecified: Secondary | ICD-10-CM

## 2018-02-11 DIAGNOSIS — G4730 Sleep apnea, unspecified: Principal | ICD-10-CM

## 2018-02-11 MED ORDER — METHYLPHENIDATE HCL 10 MG PO TABS: 10.0000 mg | ORAL_TABLET | Freq: Two times a day (BID) | ORAL | 0 refills | Status: DC

## 2018-02-11 NOTE — Telephone Encounter (Signed)
I refilled for 1 month. Please check to see who cancelled his appt.

## 2018-02-11 NOTE — Addendum Note (Signed)
Addended by: Beverely Low C on: 02/11/2018 11:52 AM   Modules accepted: Orders

## 2018-02-11 NOTE — Telephone Encounter (Signed)
Patient calling for refill of methylphenidate (RITALIN) 10 MG tablet to be sent to [facility] in [facility]. Last seen my Darrol Angel.

## 2018-02-11 NOTE — Telephone Encounter (Signed)
We are not writing pain meds and he can have his other meds per PCP.  If he likes a yearly follow up, let him have one. CD

## 2018-02-12 NOTE — Telephone Encounter (Signed)
LVM for patient advising him that Dr Vickey Huger stated he may ask his PCP to refill or call Monday and schedule a FU in order for this office to continue to refill advised him we are closed, left number for call back on Monday.

## 2018-02-15 ENCOUNTER — Ambulatory Visit (INDEPENDENT_AMBULATORY_CARE_PROVIDER_SITE_OTHER): Payer: Managed Care, Other (non HMO) | Admitting: Family Medicine

## 2018-02-15 ENCOUNTER — Encounter (INDEPENDENT_AMBULATORY_CARE_PROVIDER_SITE_OTHER): Payer: Self-pay | Admitting: Family Medicine

## 2018-02-15 VITALS — BP 118/69 | HR 80 | Ht 72.0 in | Wt 227.0 lb

## 2018-02-15 DIAGNOSIS — E669 Obesity, unspecified: Secondary | ICD-10-CM | POA: Diagnosis not present

## 2018-02-15 DIAGNOSIS — E559 Vitamin D deficiency, unspecified: Secondary | ICD-10-CM

## 2018-02-15 DIAGNOSIS — Z9189 Other specified personal risk factors, not elsewhere classified: Secondary | ICD-10-CM | POA: Diagnosis not present

## 2018-02-15 DIAGNOSIS — Z6830 Body mass index (BMI) 30.0-30.9, adult: Secondary | ICD-10-CM

## 2018-02-15 MED ORDER — VITAMIN D (ERGOCALCIFEROL) 1.25 MG (50000 UNIT) PO CAPS: 50000.0000 [IU] | ORAL_CAPSULE | ORAL | 0 refills | Status: DC

## 2018-02-23 NOTE — Progress Notes (Signed)
Office: [phone]  /  Fax: [phone]   HPI:   Chief Complaint: OBESITY [name] is here to discuss his progress with his obesity treatment plan. He is on the Category 3 plan and is following his eating plan approximately 50 % of the time. He states he is using weights and walking 35-40  minutes 3 times per week. [name] has struggled to stay on track with his eating plan. He is being mindful but due to work stress hasn't been meal planning as well. He is ready to back on track. His weight is 227 lb (103 kg) today and has gained 3 lbs since his last visit. He has lost 26 lbs since starting treatment with [name].  Vitamin D deficiency [name] has a diagnosis of vitamin D deficiency. He is currently taking prescription Vit D and denies nausea, vomiting or muscle weakness. He is not yet at goal.  At risk for osteopenia and osteoporosis [name] is at higher risk of osteopenia and osteoporosis due to vitamin D deficiency.   ASSESSMENT AND PLAN:  Vitamin D deficiency - Plan: Vitamin D, Ergocalciferol, (DRISDOL) 1.25 MG (50000 UT) CAPS capsule  At risk for osteoporosis  Class 1 obesity with serious comorbidity and body mass index (BMI) of 30.0 to 30.9 in adult, unspecified obesity type  PLAN:  Vitamin D Deficiency [name] was informed that low vitamin D levels contributes to fatigue and are associated with obesity, breast, and colon cancer. He agrees to continue to take prescription Vit D 50,000 IU every week #4 with no refills and will follow up for routine testing of vitamin D, at least 2-3 times per year. He was informed of the risk of over-replacement of vitamin D and agrees to not increase his dose unless he discusses this with [name] first. [name] agrees to follow up with our clinic in 4 weeks.  At risk for osteopenia and osteoporosis [name] was given extended  (15 minutes) osteoporosis prevention counseling today. [name] is at risk for osteopenia and osteoporosis due to his vitamin D  deficiency. He was encouraged to take his vitamin D and follow his higher calcium diet and increase strengthening exercise to help strengthen his bones and decrease his risk of osteopenia and osteoporosis.  Obesity [name] is currently in the action stage of change. As such, his goal is to continue with weight loss efforts He has agreed to follow the Category 3 plan [name] has been instructed to work up to a goal of 150 minutes of combined cardio and strengthening exercise per week for weight loss and overall health benefits. We discussed the following Behavioral Modification Strategies today: increasing lean protein intake, decreasing simple carbohydrates  and work on meal planning and easy cooking plans  [name] has agreed to follow up with our clinic in 4 weeks. He was informed of the importance of frequent follow up visits to maximize his success with intensive lifestyle modifications for his multiple health conditions.  ALLERGIES: No Known Allergies  MEDICATIONS: Current Outpatient Medications on File Prior to Visit  Medication Sig Dispense Refill  . HUMIRA PEN 40 MG/0.8ML PNKT Inject 40 mg into the skin See admin instructions. Twice monthly (1st & 15th)  1  . HYDROcodone-acetaminophen (NORCO) 7.5-325 MG tablet Take 1 tablet by mouth every 6 (six) hours as needed for moderate pain. 20 tablet 0  . levothyroxine (SYNTHROID, LEVOTHROID) 75 MCG tablet Take 1 tablet (75 mcg total) by mouth daily before breakfast. 30 tablet 0  . meloxicam (MOBIC) 15 MG tablet Take 15  mg by mouth daily as needed.     . methocarbamol (ROBAXIN) 750 MG tablet TAKE 1 TABLET DAILY PRN  2  . methylphenidate (RITALIN) 10 MG tablet Take 1 tablet (10 mg total) by mouth 2 (two) times daily. 60 tablet 0  . promethazine (PHENERGAN) 25 MG suppository Place 1 suppository (25 mg total) rectally every 6 (six) hours as needed for nausea or vomiting. 12 suppository 1  . traMADol (ULTRAM) 50 MG tablet Take by mouth every 6 (six)  hours as needed.     Current Facility-Administered Medications on File Prior to Visit  Medication Dose Route Frequency Provider Last Rate Last Dose  . gadopentetate dimeglumine (MAGNEVIST) injection 20 mL  20 mL Intravenous Once PRN Dohmeier, Porfirio Mylar, MD        PAST MEDICAL HISTORY: Past Medical History:  Diagnosis Date  . ADD (attention deficit disorder)   . Adult night terrors 03/29/2014  . Alcohol abuse   . Ankylosing spondylitis (HCC)    anklosing spondolitis  . Anxiety   . Asthma    EXERCISE INDUCED  RARELY  USES INHALER  . Back pain   . Depression   . Drug use   . Fatty liver   . Hypothyroidism   . IBS (irritable bowel syndrome)   . Joint pain   . Night terrors   . Sleep apnea    SLEEPS W/ MOUTH GUARD     PAST SURGICAL HISTORY: Past Surgical History:  Procedure Laterality Date  . KNEE SURGERY Left   . LUMBAR LAMINECTOMY/DECOMPRESSION MICRODISCECTOMY N/A 01/14/2017   Procedure: LUMBAR 3-4 DECOMPRESSION;  Surgeon: Estill Bamberg, MD;  Location: [facility];  Service: Orthopedics;  Laterality: N/A;  LUMBAR 2-4 DECOMPRESSION / 2 HRS FOR CASE.  Marland Kitchen NASAL SEPTOPLASTY W/ TURBINOPLASTY Bilateral 12/28/2017   Procedure: NASAL SEPTOPLASTY WITH TURBINATE REDUCTION;  Surgeon: Serena Colonel, MD;  Location: [facility];  Service: ENT;  Laterality: Bilateral;  . TONSILLECTOMY AND ADENOIDECTOMY  1992  . WISDOM TOOTH EXTRACTION      SOCIAL HISTORY: Social History   Tobacco Use  . Smoking status: Former Smoker    Years: 4.00    Types: Cigarettes    Last attempt to quit: 01/16/1986    Years since quitting: 32.1  . Smokeless tobacco: Never Used  Substance Use Topics  . Alcohol use: Yes    Alcohol/week: 0.0 standard drinks    Comment: Two drinks daily  . Drug use: Yes    Types: Marijuana    Comment: last used 12/27/17    FAMILY HISTORY: Family History  Problem Relation Age of Onset  . Aneurysm Mother   . Sudden death Mother   . Stroke Father   . Hypertension Father    . Thyroid disease Father   . Alcoholism Father   . Aneurysm Other        brain    ROS: Review of Systems  Constitutional: Negative for weight loss.  Gastrointestinal: Negative for nausea and vomiting.  Musculoskeletal:       Negative for muscle weakness    PHYSICAL EXAM: Blood pressure 118/69, pulse 80, height 6' (1.829 m), weight 227 lb (103 kg), SpO2 97 %. Body mass index is 30.79 kg/m. Physical Exam Vitals signs reviewed.  Constitutional:      Appearance: Normal appearance. He is obese.  Cardiovascular:     Rate and Rhythm: Normal rate.  Pulmonary:     Effort: Pulmonary effort is normal.  Musculoskeletal: Normal range of motion.  Skin:  General: Skin is warm and dry.  Neurological:     Mental Status: He is alert and oriented to person, place, and time.  Psychiatric:        Mood and Affect: Mood normal.     RECENT LABS AND TESTS: BMET    Component Value Date/Time   NA 143 11/02/2017 1408   K 4.3 11/02/2017 1408   CL 104 11/02/2017 1408   CO2 24 11/02/2017 1408   GLUCOSE 100 (H) 11/02/2017 1408   GLUCOSE 124 (H) 01/09/2017 1231   BUN 18 11/02/2017 1408   CREATININE 0.92 11/02/2017 1408   CALCIUM 9.2 11/02/2017 1408   GFRNONAA 95 11/02/2017 1408   GFRAA 110 11/02/2017 1408   Lab Results  Component Value Date   HGBA1C 5.2 11/02/2017   HGBA1C 5.1 06/22/2017   HGBA1C 5.3 02/16/2017   Lab Results  Component Value Date   INSULIN 8.8 11/02/2017   INSULIN 8.0 06/22/2017   INSULIN 10.0 02/16/2017   CBC    Component Value Date/Time   WBC 4.7 06/22/2017 1048   WBC 5.5 01/09/2017 1231   RBC 5.55 06/22/2017 1048   RBC 5.50 01/09/2017 1231   HGB 15.5 06/22/2017 1048   HCT 46.5 06/22/2017 1048   PLT 173 01/09/2017 1231   MCV 84 06/22/2017 1048   MCH 27.9 06/22/2017 1048   MCH 27.6 01/09/2017 1231   MCHC 33.3 06/22/2017 1048   MCHC 33.1 01/09/2017 1231   RDW 14.5 06/22/2017 1048   LYMPHSABS 1.6 06/22/2017 1048   MONOABS 0.4 01/09/2017 1231    EOSABS 0.1 06/22/2017 1048   BASOSABS 0.0 06/22/2017 1048   Iron/TIBC/Ferritin/ %Sat No results found for: IRON, TIBC, FERRITIN, IRONPCTSAT Lipid Panel     Component Value Date/Time   CHOL 162 06/22/2017 1048   TRIG 93 06/22/2017 1048   HDL 49 06/22/2017 1048   LDLCALC 94 06/22/2017 1048   Hepatic Function Panel     Component Value Date/Time   PROT 7.1 11/02/2017 1408   ALBUMIN 4.5 11/02/2017 1408   AST 27 11/02/2017 1408   ALT 35 11/02/2017 1408   ALKPHOS 50 11/02/2017 1408   BILITOT 0.6 11/02/2017 1408      Component Value Date/Time   TSH 4.000 06/22/2017 1048   TSH 3.430 02/16/2017 1142    Ref. Range 11/02/2017 14:08  Vitamin D, 25-Hydroxy Latest Ref Range: 30.0 - 100.0 ng/mL 47.6     OBESITY BEHAVIORAL INTERVENTION VISIT  Today's visit was # 16   Starting weight: 253 lbs Starting date: 02/16/2017 Today's weight :: 227 lbs Today's date: 02/15/2018 Total lbs lost to date: 76   ASK: We discussed the diagnosis of obesity with Ural J Finkel today and Riyad agreed to give [name] permission to discuss obesity behavioral modification therapy today.  ASSESS: Lowry has the diagnosis of obesity and his BMI today is 30.78 Markeith is in the action stage of change   ADVISE: Branton was educated on the multiple health risks of obesity as well as the benefit of weight loss to improve his health. He was advised of the need for long term treatment and the importance of lifestyle modifications to improve his current health and to decrease his risk of future health problems.  AGREE: Multiple dietary modification options and treatment options were discussed and  Ramona agreed to follow the recommendations documented in the above note.  ARRANGE: Cloyce was educated on the importance of frequent visits to treat obesity as outlined per CMS and USPSTF guidelines and agreed  to schedule his next follow up appointment today.  I, Tammy Wysor, am acting as Energy managertranscriptionist for Quillian Quincearen  Beasley MD  I have reviewed the above documentation for accuracy and completeness, and I agree with the above. -Quillian Quincearen Beasley, MD

## 2018-03-15 ENCOUNTER — Encounter (INDEPENDENT_AMBULATORY_CARE_PROVIDER_SITE_OTHER): Payer: Self-pay | Admitting: Family Medicine

## 2018-03-15 ENCOUNTER — Ambulatory Visit (INDEPENDENT_AMBULATORY_CARE_PROVIDER_SITE_OTHER): Payer: Managed Care, Other (non HMO) | Admitting: Family Medicine

## 2018-03-15 VITALS — BP 112/69 | HR 50 | Temp 97.8°F | Ht 72.0 in | Wt 221.0 lb

## 2018-03-15 DIAGNOSIS — E038 Other specified hypothyroidism: Secondary | ICD-10-CM | POA: Diagnosis not present

## 2018-03-15 DIAGNOSIS — E559 Vitamin D deficiency, unspecified: Secondary | ICD-10-CM | POA: Diagnosis not present

## 2018-03-15 DIAGNOSIS — R5383 Other fatigue: Secondary | ICD-10-CM

## 2018-03-15 DIAGNOSIS — Z9189 Other specified personal risk factors, not elsewhere classified: Secondary | ICD-10-CM | POA: Diagnosis not present

## 2018-03-15 DIAGNOSIS — E8881 Metabolic syndrome: Secondary | ICD-10-CM | POA: Diagnosis not present

## 2018-03-15 DIAGNOSIS — Z6830 Body mass index (BMI) 30.0-30.9, adult: Secondary | ICD-10-CM

## 2018-03-15 DIAGNOSIS — E669 Obesity, unspecified: Secondary | ICD-10-CM

## 2018-03-15 MED ORDER — VITAMIN D (ERGOCALCIFEROL) 1.25 MG (50000 UNIT) PO CAPS: 50000.0000 [IU] | ORAL_CAPSULE | ORAL | 0 refills | Status: DC

## 2018-03-15 NOTE — Progress Notes (Signed)
Office: [phone]  /  Fax: [phone]   HPI:   Chief Complaint: OBESITY [name] is here to discuss his progress with his obesity treatment plan. He is on the Category 3 plan and is following his eating plan approximately 50 % of the time. He states he is weights and treadmill 60 minutes 3 times per week. [name] continues to do well with weight loss. He is improving his exercising and being mindful of his food choices, but he is not following his Category 3 plan closely.   His weight is 221 lb (100.2 kg) today and has had a weight loss of 6 pounds over a period of 4 weeks since his last visit. He has lost 32 lbs since starting treatment with [name].  Vitamin D deficiency [name] has a diagnosis of vitamin D deficiency. He is currently stable on vit D and denies nausea, vomiting, or muscle weakness.  Insulin Resistance [name] has a diagnosis of insulin resistance based on his elevated fasting insulin level >5. Although [name]'s blood glucose readings are still under good control, insulin resistance puts him at greater risk of metabolic syndrome and diabetes. He is doing well on his diet and weight loss. He is not taking metformin currently and continues to work on diet and exercise to decrease risk of diabetes. He admits decreased polyphagia.   Fatigue [name] feels his energy is lower than it should be. This has worsened with weight gain and has not worsened recently. [name] notes that fatigue has improved overall, but still notes daytime fatigue.  At risk for cardiovascular disease [name] is at a higher than average risk for cardiovascular disease due to insulin resistance, fatigue, and obesity.   Hypothyroid [name] has a diagnosis of hypothyroidism. He is on Synthroid and he does admit to ongoing fatigue. He is due for labs today.   ASSESSMENT AND PLAN:  Vitamin D deficiency - Plan: Vitamin D, Ergocalciferol, (DRISDOL) 1.25 MG (50000 UT) CAPS capsule  Insulin resistance - Plan:  Comprehensive metabolic panel, Hemoglobin A1c, Insulin, random, Lipid Panel With LDL/HDL Ratio  Other fatigue  Other specified hypothyroidism  At risk for heart disease  Class 1 obesity with serious comorbidity and body mass index (BMI) of 30.0 to 30.9 in adult, unspecified obesity type  PLAN:  Vitamin D Deficiency [name] was informed that low vitamin D levels contributes to fatigue and are associated with obesity, breast, and colon cancer. Cliffton agrees to continue to take prescription Vit D ,000 IU every week #4 with no refills and will follow up for routine testing of vitamin D, at least 2-3 times per year. He was informed of the risk of over-replacement of vitamin D and agrees to not increase her dose unless she discusses this with [name] first. We will order labs today and Jcion agrees to follow up in 4 weeks as directed.  Insulin Resistance [name] will continue to work on weight loss, exercise, and decreasing simple carbohydrates in his diet to help decrease the risk of diabetes. He was informed that eating too many simple carbohydrates or too many calories at one sitting increases the likelihood of GI side effects. Labs were ordered today and  [name] agreed to continue his diet and exercise. [name] will follow up with [name] as directed to monitor his progress.  Fatigue [name] was informed that his fatigue may be related to obesity, depression or many other causes. Labs will be ordered, and in the meanwhile [name] has agreed to work on diet, exercise and weight loss to  help with fatigue. Proper sleep hygiene was discussed including the need for 7-8 hours of quality sleep each night. We will obtain labs today and [name] agrees to follow up as directed.  Cardiovascular risk counseling [name] was given extended (15 minutes) coronary artery disease prevention counseling today. He is 53 y.o. male and has risk factors for heart disease including insulin resistance, fatigue, and obesity. We  discussed intensive lifestyle modifications today with an emphasis on specific weight loss instructions and strategies. Pt was also informed of the importance of increasing exercise and decreasing saturated fats to help prevent heart disease.  Hypothyroid [name] was informed of the importance of good thyroid control to help with weight loss efforts. He was also informed that supertherapeutic thyroid levels are dangerous and will not improve weight loss results. Labs will be ordered today and [name] agrees to continue Synthroid. He agrees to follow up at the agreed upon time.  Obesity [name] is currently in the action stage of change. As such, his goal is to continue with weight loss efforts. He has agreed to follow the Category 3 plan. [name] has been instructed to work up to a goal of 150 minutes of combined cardio and strengthening exercise per week for weight loss and overall health benefits. We discussed the following Behavioral Modification Strategies today: increasing lean protein intake, decreasing simple carbohydrates, and work on meal planning and easy cooking plans.  [name] has agreed to follow up with our clinic in 4 weeks. He was informed of the importance of frequent follow up visits to maximize his success with intensive lifestyle modifications for his multiple health conditions.  ALLERGIES: No Known Allergies  MEDICATIONS: Current Outpatient Medications on File Prior to Visit  Medication Sig Dispense Refill  . HUMIRA PEN 40 MG/0.8ML PNKT Inject 40 mg into the skin See admin instructions. Twice monthly (1st & 15th)  1  . HYDROcodone-acetaminophen (NORCO) 7.5-325 MG tablet Take 1 tablet by mouth every 6 (six) hours as needed for moderate pain. 20 tablet 0  . levothyroxine (SYNTHROID, LEVOTHROID) 75 MCG tablet Take 1 tablet (75 mcg total) by mouth daily before breakfast. 30 tablet 0  . meloxicam (MOBIC) 15 MG tablet Take 15 mg by mouth daily as needed.     . methocarbamol  (ROBAXIN) 750 MG tablet TAKE 1 TABLET DAILY PRN  2  . methylphenidate (RITALIN) 10 MG tablet Take 1 tablet (10 mg total) by mouth 2 (two) times daily. 60 tablet 0  . promethazine (PHENERGAN) 25 MG suppository Place 1 suppository (25 mg total) rectally every 6 (six) hours as needed for nausea or vomiting. 12 suppository 1  . traMADol (ULTRAM) 50 MG tablet Take by mouth every 6 (six) hours as needed.    . Vitamin D, Ergocalciferol, (DRISDOL) 1.25 MG (50000 UT) CAPS capsule Take 1 capsule (50,000 Units total) by mouth every 7 (seven) days. 4 capsule 0   Current Facility-Administered Medications on File Prior to Visit  Medication Dose Route Frequency Provider Last Rate Last Dose  . gadopentetate dimeglumine (MAGNEVIST) injection 20 mL  20 mL Intravenous Once PRN Dohmeier, Porfirio Mylar, MD        PAST MEDICAL HISTORY: Past Medical History:  Diagnosis Date  . ADD (attention deficit disorder)   . Adult night terrors 03/29/2014  . Alcohol abuse   . Ankylosing spondylitis (HCC)    anklosing spondolitis  . Anxiety   . Asthma    EXERCISE INDUCED  RARELY  USES INHALER  . Back pain   . Depression   .  Drug use   . Fatty liver   . Hypothyroidism   . IBS (irritable bowel syndrome)   . Joint pain   . Night terrors   . Sleep apnea    SLEEPS W/ MOUTH GUARD     PAST SURGICAL HISTORY: Past Surgical History:  Procedure Laterality Date  . KNEE SURGERY Left   . LUMBAR LAMINECTOMY/DECOMPRESSION MICRODISCECTOMY N/A 01/14/2017   Procedure: LUMBAR 3-4 DECOMPRESSION;  Surgeon: Estill Bamberg, MD;  Location: [facility];  Service: Orthopedics;  Laterality: N/A;  LUMBAR 2-4 DECOMPRESSION / 2 HRS FOR CASE.  Marland Kitchen NASAL SEPTOPLASTY W/ TURBINOPLASTY Bilateral 12/28/2017   Procedure: NASAL SEPTOPLASTY WITH TURBINATE REDUCTION;  Surgeon: Serena Colonel, MD;  Location: [facility];  Service: ENT;  Laterality: Bilateral;  . TONSILLECTOMY AND ADENOIDECTOMY  1992  . WISDOM TOOTH EXTRACTION      SOCIAL  HISTORY: Social History   Tobacco Use  . Smoking status: Former Smoker    Years: 4.00    Types: Cigarettes    Last attempt to quit: 01/16/1986    Years since quitting: 32.1  . Smokeless tobacco: Never Used  Substance Use Topics  . Alcohol use: Yes    Alcohol/week: 0.0 standard drinks    Comment: Two drinks daily  . Drug use: Yes    Types: Marijuana    Comment: last used 12/27/17    FAMILY HISTORY: Family History  Problem Relation Age of Onset  . Aneurysm Mother   . Sudden death Mother   . Stroke Father   . Hypertension Father   . Thyroid disease Father   . Alcoholism Father   . Aneurysm Other        brain    ROS: Review of Systems  Constitutional: Positive for malaise/fatigue and weight loss.  Gastrointestinal: Negative for nausea and vomiting.  Musculoskeletal:       Negative for muscle weakness.  Endo/Heme/Allergies:       Positive for polyphagia.    PHYSICAL EXAM: Blood pressure 112/69, pulse (!) 50, temperature 97.8 F (36.6 C), temperature source Oral, height 6' (1.829 m), weight 221 lb (100.2 kg), SpO2 99 %. Body mass index is 29.97 kg/m. Physical Exam Vitals signs reviewed.  Constitutional:      Appearance: Normal appearance. He is obese.  Cardiovascular:     Rate and Rhythm: Normal rate.  Pulmonary:     Effort: Pulmonary effort is normal.  Musculoskeletal: Normal range of motion.  Skin:    General: Skin is warm and dry.  Neurological:     Mental Status: He is alert and oriented to person, place, and time.  Psychiatric:        Mood and Affect: Mood normal.        Behavior: Behavior normal.     RECENT LABS AND TESTS: BMET    Component Value Date/Time   NA 143 11/02/2017 1408   K 4.3 11/02/2017 1408   CL 104 11/02/2017 1408   CO2 24 11/02/2017 1408   GLUCOSE 100 (H) 11/02/2017 1408   GLUCOSE 124 (H) 01/09/2017 1231   BUN 18 11/02/2017 1408   CREATININE 0.92 11/02/2017 1408   CALCIUM 9.2 11/02/2017 1408   GFRNONAA 95 11/02/2017 1408    GFRAA 110 11/02/2017 1408   Lab Results  Component Value Date   HGBA1C 5.2 11/02/2017   HGBA1C 5.1 06/22/2017   HGBA1C 5.3 02/16/2017   Lab Results  Component Value Date   INSULIN 8.8 11/02/2017   INSULIN 8.0 06/22/2017   INSULIN 10.0  02/16/2017   CBC    Component Value Date/Time   WBC 4.7 06/22/2017 1048   WBC 5.5 01/09/2017 1231   RBC 5.55 06/22/2017 1048   RBC 5.50 01/09/2017 1231   HGB 15.5 06/22/2017 1048   HCT 46.5 06/22/2017 1048   PLT 173 01/09/2017 1231   MCV 84 06/22/2017 1048   MCH 27.9 06/22/2017 1048   MCH 27.6 01/09/2017 1231   MCHC 33.3 06/22/2017 1048   MCHC 33.1 01/09/2017 1231   RDW 14.5 06/22/2017 1048   LYMPHSABS 1.6 06/22/2017 1048   MONOABS 0.4 01/09/2017 1231   EOSABS 0.1 06/22/2017 1048   BASOSABS 0.0 06/22/2017 1048   Iron/TIBC/Ferritin/ %Sat No results found for: IRON, TIBC, FERRITIN, IRONPCTSAT Lipid Panel     Component Value Date/Time   CHOL 162 06/22/2017 1048   TRIG 93 06/22/2017 1048   HDL 49 06/22/2017 1048   LDLCALC 94 06/22/2017 1048   Hepatic Function Panel     Component Value Date/Time   PROT 7.1 11/02/2017 1408   ALBUMIN 4.5 11/02/2017 1408   AST 27 11/02/2017 1408   ALT 35 11/02/2017 1408   ALKPHOS 50 11/02/2017 1408   BILITOT 0.6 11/02/2017 1408      Component Value Date/Time   TSH 4.000 06/22/2017 1048   TSH 3.430 02/16/2017 1142   Results for TORE, WALDVOGEL (MRN [id number]) as of 03/15/2018 11:46  Ref. Range 11/02/2017 14:08  Vitamin D, 25-Hydroxy Latest Ref Range: 30.0 - 100.0 ng/mL 47.6   OBESITY BEHAVIORAL INTERVENTION VISIT  Today's visit was # 17   Starting weight: 253 lbs Starting date: 02/16/17 Today's weight : Weight: 221 lb (100.2 kg)  Today's date: 03/15/2018 Total lbs lost to date: 32    03/15/2018  Height 6' (1.829 m)  Weight 221 lb (100.2 kg)  BMI (Calculated) 29.97  BLOOD PRESSURE - SYSTOLIC 112  BLOOD PRESSURE - DIASTOLIC 69   Body Fat % 27.9 %  Total Body Water (lbs) 106.6 lbs     ASK: We discussed the diagnosis of obesity with Filiberto J Bradshaw today and Fredrick agreed to give [name] permission to discuss obesity behavioral modification therapy today.  ASSESS: [name] has the diagnosis of obesity and his BMI today is 29.97. [name] is in the action stage of change.   ADVISE: [name] was educated on the multiple health risks of obesity as well as the benefit of weight loss to improve his health. He was advised of the need for long term treatment and the importance of lifestyle modifications to improve his current health and to decrease his risk of future health problems.  AGREE: Multiple dietary modification options and treatment options were discussed and [name] agreed to follow the recommendations documented in the above note.  ARRANGE: [name] was educated on the importance of frequent visits to treat obesity as outlined per CMS and USPSTF guidelines and agreed to schedule his next follow up appointment today.  IKirke Corin, CMA, am acting as transcriptionist for Wilder Glade, MD  I have reviewed the above documentation for accuracy and completeness, and I agree with the above. -Quillian Quince, MD

## 2018-03-16 LAB — COMPREHENSIVE METABOLIC PANEL
ALT: 27 IU/L (ref 0–44)
AST: 19 IU/L (ref 0–40)
Albumin/Globulin Ratio: 2.3 — ABNORMAL HIGH (ref 1.2–2.2)
Albumin: 4.9 g/dL (ref 3.8–4.9)
Alkaline Phosphatase: 55 IU/L (ref 39–117)
BUN/Creatinine Ratio: 21 — ABNORMAL HIGH (ref 9–20)
BUN: 21 mg/dL (ref 6–24)
Bilirubin Total: 0.7 mg/dL (ref 0.0–1.2)
CALCIUM: 9.4 mg/dL (ref 8.7–10.2)
CO2: 22 mmol/L (ref 20–29)
Chloride: 104 mmol/L (ref 96–106)
Creatinine, Ser: 0.99 mg/dL (ref 0.76–1.27)
GFR, EST AFRICAN AMERICAN: 100 mL/min/{1.73_m2} (ref >59)
GFR, EST NON AFRICAN AMERICAN: 87 mL/min/{1.73_m2} (ref >59)
Globulin, Total: 2.1 g/dL (ref 1.5–4.5)
Glucose: 81 mg/dL (ref 65–99)
Potassium: 4.5 mmol/L (ref 3.5–5.2)
Sodium: 141 mmol/L (ref 134–144)
Total Protein: 7 g/dL (ref 6.0–8.5)

## 2018-03-16 LAB — LIPID PANEL WITH LDL/HDL RATIO
Cholesterol, Total: 123 mg/dL (ref 100–199)
HDL: 36 mg/dL — ABNORMAL LOW (ref >39)
LDL Calculated: 70 mg/dL (ref 0–99)
LDl/HDL Ratio: 1.9 ratio (ref 0.0–3.6)
TRIGLYCERIDES: 85 mg/dL (ref 0–149)
VLDL Cholesterol Cal: 17 mg/dL (ref 5–40)

## 2018-03-16 LAB — INSULIN, RANDOM: INSULIN: 6.1 u[IU]/mL (ref 2.6–24.9)

## 2018-03-16 LAB — HEMOGLOBIN A1C
Est. average glucose Bld gHb Est-mCnc: 100 mg/dL
Hgb A1c MFr Bld: 5.1 % (ref 4.8–5.6)

## 2018-04-02 ENCOUNTER — Telehealth: Payer: Self-pay | Admitting: Nurse Practitioner

## 2018-04-02 DIAGNOSIS — Z9989 Dependence on other enabling machines and devices: Secondary | ICD-10-CM

## 2018-04-02 DIAGNOSIS — G4733 Obstructive sleep apnea (adult) (pediatric): Secondary | ICD-10-CM

## 2018-04-02 DIAGNOSIS — G4710 Hypersomnia, unspecified: Secondary | ICD-10-CM

## 2018-04-02 DIAGNOSIS — F901 Attention-deficit hyperactivity disorder, predominantly hyperactive type: Secondary | ICD-10-CM

## 2018-04-02 DIAGNOSIS — G4730 Sleep apnea, unspecified: Principal | ICD-10-CM

## 2018-04-02 MED ORDER — METHYLPHENIDATE HCL 10 MG PO TABS: 10.0000 mg | ORAL_TABLET | Freq: Two times a day (BID) | ORAL | 0 refills | Status: DC

## 2018-04-02 NOTE — Telephone Encounter (Signed)
Pt called in and wanted a refill of methylphenidate (RITALIN) 10 MG tablet he said he wants to move it to 40mg  and to be sent to [facility] in [place]

## 2018-04-02 NOTE — Telephone Encounter (Addendum)
Spoke with patient and advised him his 11 year FU needs to be rescheduled. Advised him the refill request was sent to Dr Vickey Huger and will be refilled after we schedule his FU.  I scheduled him with Dr Dohmeier's NP, A Lomax. He verbalized understanding, appreciation.

## 2018-04-02 NOTE — Telephone Encounter (Signed)
I refilled, please let him know.

## 2018-04-07 ENCOUNTER — Encounter (INDEPENDENT_AMBULATORY_CARE_PROVIDER_SITE_OTHER): Payer: Self-pay

## 2018-04-12 ENCOUNTER — Ambulatory Visit (INDEPENDENT_AMBULATORY_CARE_PROVIDER_SITE_OTHER): Payer: Managed Care, Other (non HMO) | Admitting: Family Medicine

## 2018-04-26 ENCOUNTER — Encounter (INDEPENDENT_AMBULATORY_CARE_PROVIDER_SITE_OTHER): Payer: Self-pay | Admitting: Family Medicine

## 2018-04-26 ENCOUNTER — Ambulatory Visit (INDEPENDENT_AMBULATORY_CARE_PROVIDER_SITE_OTHER): Payer: Managed Care, Other (non HMO) | Admitting: Family Medicine

## 2018-04-26 ENCOUNTER — Other Ambulatory Visit (INDEPENDENT_AMBULATORY_CARE_PROVIDER_SITE_OTHER): Payer: Self-pay | Admitting: Family Medicine

## 2018-04-26 ENCOUNTER — Other Ambulatory Visit: Payer: Self-pay

## 2018-04-26 DIAGNOSIS — E038 Other specified hypothyroidism: Secondary | ICD-10-CM

## 2018-04-26 DIAGNOSIS — Z6830 Body mass index (BMI) 30.0-30.9, adult: Secondary | ICD-10-CM

## 2018-04-26 DIAGNOSIS — E559 Vitamin D deficiency, unspecified: Secondary | ICD-10-CM

## 2018-04-26 DIAGNOSIS — E669 Obesity, unspecified: Secondary | ICD-10-CM | POA: Diagnosis not present

## 2018-04-26 MED ORDER — LEVOTHYROXINE SODIUM 75 MCG PO TABS: 75.0000 ug | ORAL_TABLET | Freq: Every day | ORAL | 0 refills | Status: DC

## 2018-04-26 NOTE — Progress Notes (Signed)
Office: [phone]  /  Fax: [phone] TeleHealth Visit:  [name] has verbally consented to this TeleHealth visit today. The patient is located at home, the provider is located at the [facility] office. The participants in this visit include the listed provider and patient. [name] did not answer Face Time and did not have wi fi today and so the Telehealth visit was conducted via telephone.  HPI:   Chief Complaint: OBESITY [name] is here to discuss his progress with his obesity treatment plan. He is on the Category 3 plan and is following his eating plan approximately 70 % of the time. He states he is using the elliptical and weights 30 minutes 5 times per week. [name] states that he is maintaining his weight and is exercising regularly. He notes some increased stress eating, but is still mindful of making healthier choices.  We were unable to weigh the patient today for this TeleHealth visit. He feels as if he has maintained weight since his last visit. He has lost 32 lbs since starting treatment with [name].  Hypothyroid [name] has a diagnosis of hypothyroidism. He is on levothyroxine 75 mcg and his last levels were at goal, but his last labs were 6 months ago. He denies hot or cold intolerance or palpitations.  ASSESSMENT AND PLAN:  Other specified hypothyroidism - Plan: levothyroxine (SYNTHROID, LEVOTHROID) 75 MCG tablet  Class 1 obesity with serious comorbidity and body mass index (BMI) of 30.0 to 30.9 in adult, unspecified obesity type - Starting BMI greater then 30  PLAN:  Hypothyroid [name] was informed of the importance of good thyroid control to help with weight loss efforts. He was also informed that supertherapeutic thyroid levels are dangerous and will not improve weight loss results. [name] agrees to levothyroxine 75 mcg qAM #30 with no refills and we will order labs at his next visit. [name] agrees to follow up in 3 weeks as directed.  Obesity  [name] is currently in the action stage of change. As such, his goal is to continue with weight loss efforts. He has agreed to follow the Category 3 plan. [name] has been instructed to work up to a goal of 150 minutes of combined cardio and strengthening exercise per week for weight loss and overall health benefits. We discussed the following Behavioral Modification Strategies today: work on meal planning and easy cooking plans, increase H2O intake, keeping healthy foods in the home, better snacking choices, and decrease ETOH.  [name] has agreed to follow up with our clinic in 3 weeks. He was informed of the importance of frequent follow up visits to maximize his success with intensive lifestyle modifications for his multiple health conditions.  ALLERGIES: No Known Allergies  MEDICATIONS: Current Outpatient Medications on File Prior to Visit  Medication Sig Dispense Refill  . HUMIRA PEN 40 MG/0.8ML PNKT Inject 40 mg into the skin See admin instructions. Twice monthly (1st & 15th)  1  . HYDROcodone-acetaminophen (NORCO) 7.5-325 MG tablet Take 1 tablet by mouth every 6 (six) hours as needed for moderate pain. 20 tablet 0  . meloxicam (MOBIC) 15 MG tablet Take 15 mg by mouth daily as needed.     . methocarbamol (ROBAXIN) 750 MG tablet TAKE 1 TABLET DAILY PRN  2  . methylphenidate (RITALIN) 10 MG tablet Take 1 tablet (10 mg total) by mouth 2 (two) times daily. 60 tablet 0  . promethazine (PHENERGAN) 25 MG suppository Place 1 suppository (25 mg total) rectally every 6 (six) hours  as needed for nausea or vomiting. 12 suppository 1  . traMADol (ULTRAM) 50 MG tablet Take by mouth every 6 (six) hours as needed.    . Vitamin D, Ergocalciferol, (DRISDOL) 1.25 MG (50000 UT) CAPS capsule Take 1 capsule (50,000 Units total) by mouth every 7 (seven) days. 4 capsule 0   Current Facility-Administered Medications on File Prior to Visit  Medication Dose Route Frequency Provider Last Rate Last Dose  .  gadopentetate dimeglumine (MAGNEVIST) injection 20 mL  20 mL Intravenous Once PRN [name], Joe Mylar, [name]        PAST MEDICAL HISTORY: Past Medical History:  Diagnosis Date  . ADD (attention deficit disorder)   . Adult night terrors 03/29/2014  . Alcohol abuse   . Ankylosing spondylitis (HCC)    anklosing spondolitis  . Anxiety   . Asthma    EXERCISE INDUCED  RARELY  USES INHALER  . Back pain   . Depression   . Drug use   . Fatty liver   . Hypothyroidism   . IBS (irritable bowel syndrome)   . Joint pain   . Night terrors   . Sleep apnea    SLEEPS W/ MOUTH GUARD     PAST SURGICAL HISTORY: Past Surgical History:  Procedure Laterality Date  . KNEE SURGERY Left   . LUMBAR LAMINECTOMY/DECOMPRESSION MICRODISCECTOMY N/A 01/14/2017   Procedure: LUMBAR 3-4 DECOMPRESSION;  Surgeon: Joe Bamberg, [name];  Location: [facility] OR;  Service: Orthopedics;  Laterality: N/A;  LUMBAR 2-4 DECOMPRESSION / 2 HRS FOR CASE.  Marland Kitchen NASAL SEPTOPLASTY W/ TURBINOPLASTY Bilateral 12/28/2017   Procedure: NASAL SEPTOPLASTY WITH TURBINATE REDUCTION;  Surgeon: Joe Colonel, [name];  Location: [facility];  Service: ENT;  Laterality: Bilateral;  . TONSILLECTOMY AND ADENOIDECTOMY  1992  . WISDOM TOOTH EXTRACTION      SOCIAL HISTORY: Social History   Tobacco Use  . Smoking status: Former Smoker    Years: 4.00    Types: Cigarettes    Last attempt to quit: 01/16/1986    Years since quitting: 32.2  . Smokeless tobacco: Never Used  Substance Use Topics  . Alcohol use: Yes    Alcohol/week: 0.0 standard drinks    Comment: Two drinks daily  . Drug use: Yes    Types: Marijuana    Comment: last used 12/27/17    FAMILY HISTORY: Family History  Problem Relation Age of Onset  . Aneurysm Mother   . Sudden death Mother   . Stroke Father   . Hypertension Father   . Thyroid disease Father   . Alcoholism Father   . Aneurysm Other        brain    ROS: Review of Systems  Cardiovascular: Negative for  palpitations.  Endo/Heme/Allergies:       Negative for heat intolerance. Negative for cold intolerance.    PHYSICAL EXAM: Pt in no acute distress  RECENT LABS AND TESTS: BMET    Component Value Date/Time   NA 141 03/15/2018 1142   K 4.5 03/15/2018 1142   CL 104 03/15/2018 1142   CO2 22 03/15/2018 1142   GLUCOSE 81 03/15/2018 1142   GLUCOSE 124 (H) 01/09/2017 1231   BUN 21 03/15/2018 1142   CREATININE 0.99 03/15/2018 1142   CALCIUM 9.4 03/15/2018 1142   GFRNONAA 87 03/15/2018 1142   GFRAA 100 03/15/2018 1142   Lab Results  Component Value Date   HGBA1C 5.1 03/15/2018   HGBA1C 5.2 11/02/2017   HGBA1C 5.1 06/22/2017   HGBA1C 5.3 02/16/2017  Lab Results  Component Value Date   INSULIN 6.1 03/15/2018   INSULIN 8.8 11/02/2017   INSULIN 8.0 06/22/2017   INSULIN 10.0 02/16/2017   CBC    Component Value Date/Time   WBC 4.7 06/22/2017 1048   WBC 5.5 01/09/2017 1231   RBC 5.55 06/22/2017 1048   RBC 5.50 01/09/2017 1231   HGB 15.5 06/22/2017 1048   HCT 46.5 06/22/2017 1048   PLT 173 01/09/2017 1231   MCV 84 06/22/2017 1048   MCH 27.9 06/22/2017 1048   MCH 27.6 01/09/2017 1231   MCHC 33.3 06/22/2017 1048   MCHC 33.1 01/09/2017 1231   RDW 14.5 06/22/2017 1048   LYMPHSABS 1.6 06/22/2017 1048   MONOABS 0.4 01/09/2017 1231   EOSABS 0.1 06/22/2017 1048   BASOSABS 0.0 06/22/2017 1048   Iron/TIBC/Ferritin/ %Sat No results found for: IRON, TIBC, FERRITIN, IRONPCTSAT Lipid Panel     Component Value Date/Time   CHOL 123 03/15/2018 1142   TRIG 85 03/15/2018 1142   HDL 36 (L) 03/15/2018 1142   LDLCALC 70 03/15/2018 1142   Hepatic Function Panel     Component Value Date/Time   PROT 7.0 03/15/2018 1142   ALBUMIN 4.9 03/15/2018 1142   AST 19 03/15/2018 1142   ALT 27 03/15/2018 1142   ALKPHOS 55 03/15/2018 1142   BILITOT 0.7 03/15/2018 1142      Component Value Date/Time   TSH 4.000 06/22/2017 1048   TSH 3.430 02/16/2017 1142   Results for Theadora RamaDUMOND, [name]  (MRN [id number]) as of 04/26/2018 16:49  Ref. Range 11/02/2017 14:08  Vitamin D, 25-Hydroxy Latest Ref Range: 30.0 - 100.0 ng/mL 47.6     I, [name], [name], am acting as transcriptionist for Joe Gladearen D. Shaquinta Peruski, [name] I have reviewed the above documentation for accuracy and completeness, and I agree with the above. -Joe Quincearen Mirian Casco, [name]

## 2018-05-17 ENCOUNTER — Ambulatory Visit (INDEPENDENT_AMBULATORY_CARE_PROVIDER_SITE_OTHER): Payer: Managed Care, Other (non HMO) | Admitting: Family Medicine

## 2018-05-17 ENCOUNTER — Other Ambulatory Visit: Payer: Self-pay

## 2018-05-17 ENCOUNTER — Encounter (INDEPENDENT_AMBULATORY_CARE_PROVIDER_SITE_OTHER): Payer: Self-pay | Admitting: Family Medicine

## 2018-05-17 DIAGNOSIS — E038 Other specified hypothyroidism: Secondary | ICD-10-CM | POA: Diagnosis not present

## 2018-05-17 DIAGNOSIS — Z6830 Body mass index (BMI) 30.0-30.9, adult: Secondary | ICD-10-CM

## 2018-05-17 DIAGNOSIS — E669 Obesity, unspecified: Secondary | ICD-10-CM

## 2018-05-17 DIAGNOSIS — E559 Vitamin D deficiency, unspecified: Secondary | ICD-10-CM

## 2018-05-17 MED ORDER — LEVOTHYROXINE SODIUM 75 MCG PO TABS: 75.0000 ug | ORAL_TABLET | Freq: Every day | ORAL | 0 refills | Status: DC

## 2018-05-17 MED ORDER — VITAMIN D (ERGOCALCIFEROL) 1.25 MG (50000 UNIT) PO CAPS: 50000.0000 [IU] | ORAL_CAPSULE | ORAL | 0 refills | Status: DC

## 2018-05-17 NOTE — Progress Notes (Signed)
Office: [phone]  /  Fax: [phone] TeleHealth Visit:  [name] See has verbally consented to this TeleHealth visit today. The patient is located in a parking lot, the provider is located at the [facility] office. The participants in this visit include the listed provider and patient. The visit was conducted today via Face Time.  HPI:   Chief Complaint: OBESITY [name] is here to discuss his progress with his obesity treatment plan. He is on the Category 3 plan and is following his eating plan approximately 75 % of the time. He states he is using the elliptical for 30 minutes 5 times per week. [name] feels that he has gained weight and is deviating from his plan. He has increased exercise to help compensate for his extra snacking. His hunger is controlled.  We were unable to weigh the patient today for this TeleHealth visit. He feels as if he has gained weight since his last visit. He has lost 32 lbs since starting treatment with [name].  Hypothyroid [name] has a diagnosis of hypothyroidism. He is stable on levothyroxine 75 mcg and he is taking it regularly. He denies hot or cold intolerance or excessive fatigue.  Vitamin D Deficiency [name] has a diagnosis of vitamin D deficiency. He is currently stable on vit D and is almost at goal. [name] denies nausea, vomiting, or muscle weakness.  ASSESSMENT AND PLAN:  Other specified hypothyroidism - Plan: levothyroxine (SYNTHROID) 75 MCG tablet  Vitamin D deficiency - Plan: Vitamin D, Ergocalciferol, (DRISDOL) 1.25 MG (50000 UT) CAPS capsule  Class 1 obesity with serious comorbidity and body mass index (BMI) of 30.0 to 30.9 in adult, unspecified obesity type - Starting BMI greater then 30  PLAN:  Hypothyroid [name] was informed of the importance of good thyroid control to help with weight loss efforts. He was also informed that supertherapeutic thyroid levels are dangerous and will not improve weight loss results. [name]  agrees to continue taking levothyroxine 75 mcg qd #30 with no refills and to follow up in 4 weeks.  Vitamin D Deficiency [name] was informed that low vitamin D levels contribute to fatigue and are associated with obesity, breast, and colon cancer. [name] agrees to continue to take prescription Vit D ,000 IU every week #4 with no refills and will follow up for routine testing of vitamin D, at least 2-3 times per year. He was informed of the risk of over-replacement of vitamin D and agrees to not increase his dose unless he discusses this with [name] first. [name] agrees to follow up in 4 weeks as directed.  Obesity [name] is currently in the action stage of change. As such, his goal is to continue with weight loss efforts. He has agreed to follow the Category 3 plan. [name] has been instructed to work up to a goal of 150 minutes of combined cardio and strengthening exercise per week for weight loss and overall health benefits. We discussed the following Behavioral Modification Strategies today: better snacking choices, decrease ETOH, and decrease liquid calories.  [name] has agreed to follow up with our clinic in 4 weeks. He was informed of the importance of frequent follow up visits to maximize his success with intensive lifestyle modifications for his multiple health conditions.  ALLERGIES: No Known Allergies  MEDICATIONS: Current Outpatient Medications on File Prior to Visit  Medication Sig Dispense Refill  . HUMIRA PEN 40 MG/0.8ML PNKT Inject 40 mg into the skin See admin instructions. Twice monthly (1st & 15th)  1  .  HYDROcodone-acetaminophen (NORCO) 7.5-325 MG tablet Take 1 tablet by mouth every 6 (six) hours as needed for moderate pain. 20 tablet 0  . levothyroxine (SYNTHROID, LEVOTHROID) 75 MCG tablet Take 1 tablet (75 mcg total) by mouth daily before breakfast. 30 tablet 0  . meloxicam (MOBIC) 15 MG tablet Take 15 mg by mouth daily as needed.     . methocarbamol (ROBAXIN) 750 MG tablet  TAKE 1 TABLET DAILY PRN  2  . methylphenidate (RITALIN) 10 MG tablet Take 1 tablet (10 mg total) by mouth 2 (two) times daily. 60 tablet 0  . promethazine (PHENERGAN) 25 MG suppository Place 1 suppository (25 mg total) rectally every 6 (six) hours as needed for nausea or vomiting. 12 suppository 1  . traMADol (ULTRAM) 50 MG tablet Take by mouth every 6 (six) hours as needed.    . Vitamin D, Ergocalciferol, (DRISDOL) 1.25 MG (50000 UT) CAPS capsule Take 1 capsule (50,000 Units total) by mouth every 7 (seven) days. 4 capsule 0   Current Facility-Administered Medications on File Prior to Visit  Medication Dose Route Frequency Provider Last Rate Last Dose  . gadopentetate dimeglumine (MAGNEVIST) injection 20 mL  20 mL Intravenous Once PRN Dohmeier, Porfirio Mylararmen, MD        PAST MEDICAL HISTORY: Past Medical History:  Diagnosis Date  . ADD (attention deficit disorder)   . Adult night terrors 03/29/2014  . Alcohol abuse   . Ankylosing spondylitis (HCC)    anklosing spondolitis  . Anxiety   . Asthma    EXERCISE INDUCED  RARELY  USES INHALER  . Back pain   . Depression   . Drug use   . Fatty liver   . Hypothyroidism   . IBS (irritable bowel syndrome)   . Joint pain   . Night terrors   . Sleep apnea    SLEEPS W/ MOUTH GUARD     PAST SURGICAL HISTORY: Past Surgical History:  Procedure Laterality Date  . KNEE SURGERY Left   . LUMBAR LAMINECTOMY/DECOMPRESSION MICRODISCECTOMY N/A 01/14/2017   Procedure: LUMBAR 3-4 DECOMPRESSION;  Surgeon: Estill Bambergumonski, Mark, MD;  Location: [facility] OR;  Service: Orthopedics;  Laterality: N/A;  LUMBAR 2-4 DECOMPRESSION / 2 HRS FOR CASE.  Marland Kitchen. NASAL SEPTOPLASTY W/ TURBINOPLASTY Bilateral 12/28/2017   Procedure: NASAL SEPTOPLASTY WITH TURBINATE REDUCTION;  Surgeon: Serena Colonelosen, Jefry, MD;  Location: [facility];  Service: ENT;  Laterality: Bilateral;  . TONSILLECTOMY AND ADENOIDECTOMY  1992  . WISDOM TOOTH EXTRACTION      SOCIAL HISTORY: Social History   Tobacco  Use  . Smoking status: Former Smoker    Years: 4.00    Types: Cigarettes    Last attempt to quit: 01/16/1986    Years since quitting: 32.3  . Smokeless tobacco: Never Used  Substance Use Topics  . Alcohol use: Yes    Alcohol/week: 0.0 standard drinks    Comment: Two drinks daily  . Drug use: Yes    Types: Marijuana    Comment: last used 12/27/17    FAMILY HISTORY: Family History  Problem Relation Age of Onset  . Aneurysm Mother   . Sudden death Mother   . Stroke Father   . Hypertension Father   . Thyroid disease Father   . Alcoholism Father   . Aneurysm Other        brain    ROS: Review of Systems  Cardiovascular: Negative for palpitations.  Gastrointestinal: Negative for nausea and vomiting.  Musculoskeletal:       Negative for muscle  weakness.  Endo/Heme/Allergies:       Negative for heat intolerance. Negative for cold intolerance.    PHYSICAL EXAM: Pt in no acute distress  RECENT LABS AND TESTS: BMET    Component Value Date/Time   NA 141 03/15/2018 1142   K 4.5 03/15/2018 1142   CL 104 03/15/2018 1142   CO2 22 03/15/2018 1142   GLUCOSE 81 03/15/2018 1142   GLUCOSE 124 (H) 01/09/2017 1231   BUN 21 03/15/2018 1142   CREATININE 0.99 03/15/2018 1142   CALCIUM 9.4 03/15/2018 1142   GFRNONAA 87 03/15/2018 1142   GFRAA 100 03/15/2018 1142   Lab Results  Component Value Date   HGBA1C 5.1 03/15/2018   HGBA1C 5.2 11/02/2017   HGBA1C 5.1 06/22/2017   HGBA1C 5.3 02/16/2017   Lab Results  Component Value Date   INSULIN 6.1 03/15/2018   INSULIN 8.8 11/02/2017   INSULIN 8.0 06/22/2017   INSULIN 10.0 02/16/2017   CBC    Component Value Date/Time   WBC 4.7 06/22/2017 1048   WBC 5.5 01/09/2017 1231   RBC 5.55 06/22/2017 1048   RBC 5.50 01/09/2017 1231   HGB 15.5 06/22/2017 1048   HCT 46.5 06/22/2017 1048   PLT 173 01/09/2017 1231   MCV 84 06/22/2017 1048   MCH 27.9 06/22/2017 1048   MCH 27.6 01/09/2017 1231   MCHC 33.3 06/22/2017 1048   MCHC 33.1  01/09/2017 1231   RDW 14.5 06/22/2017 1048   LYMPHSABS 1.6 06/22/2017 1048   MONOABS 0.4 01/09/2017 1231   EOSABS 0.1 06/22/2017 1048   BASOSABS 0.0 06/22/2017 1048   Iron/TIBC/Ferritin/ %Sat No results found for: IRON, TIBC, FERRITIN, IRONPCTSAT Lipid Panel     Component Value Date/Time   CHOL 123 03/15/2018 1142   TRIG 85 03/15/2018 1142   HDL 36 (L) 03/15/2018 1142   LDLCALC 70 03/15/2018 1142   Hepatic Function Panel     Component Value Date/Time   PROT 7.0 03/15/2018 1142   ALBUMIN 4.9 03/15/2018 1142   AST 19 03/15/2018 1142   ALT 27 03/15/2018 1142   ALKPHOS 55 03/15/2018 1142   BILITOT 0.7 03/15/2018 1142      Component Value Date/Time   TSH 4.000 06/22/2017 1048   TSH 3.430 02/16/2017 1142   Results for BRAESON, KETNER (MRN [id number]) as of 05/17/2018 12:57  Ref. Range 11/02/2017 14:08  Vitamin D, 25-Hydroxy Latest Ref Range: 30.0 - 100.0 ng/mL 47.6     I, Kirke Corin, CMA, am acting as transcriptionist for Wilder Glade, MD I have reviewed the above documentation for accuracy and completeness, and I agree with the above. -Quillian Quince, MD

## 2018-05-18 ENCOUNTER — Telehealth: Payer: Self-pay

## 2018-05-18 NOTE — Telephone Encounter (Signed)
Spoke with the patient and I was able to reschedule his appt with Amy for 06/01/2018 at 9:30 am. He has given verbal consent to doing a doxy.me visit with Amy and to file his insurance. E-mail, mobile number and carrier have been confirmed and sent.  E-mail: [email or web address] Text: [phone]( [employer])

## 2018-05-24 ENCOUNTER — Ambulatory Visit: Payer: 59 | Admitting: Nurse Practitioner

## 2018-05-24 ENCOUNTER — Ambulatory Visit: Payer: Self-pay | Admitting: Family Medicine

## 2018-06-01 ENCOUNTER — Ambulatory Visit (INDEPENDENT_AMBULATORY_CARE_PROVIDER_SITE_OTHER): Payer: Managed Care, Other (non HMO) | Admitting: Family Medicine

## 2018-06-01 ENCOUNTER — Encounter: Payer: Self-pay | Admitting: Family Medicine

## 2018-06-01 ENCOUNTER — Other Ambulatory Visit: Payer: Self-pay

## 2018-06-01 DIAGNOSIS — G4730 Sleep apnea, unspecified: Secondary | ICD-10-CM

## 2018-06-01 DIAGNOSIS — F514 Sleep terrors [night terrors]: Secondary | ICD-10-CM | POA: Diagnosis not present

## 2018-06-01 DIAGNOSIS — F901 Attention-deficit hyperactivity disorder, predominantly hyperactive type: Secondary | ICD-10-CM

## 2018-06-01 DIAGNOSIS — Z9989 Dependence on other enabling machines and devices: Secondary | ICD-10-CM

## 2018-06-01 DIAGNOSIS — G4733 Obstructive sleep apnea (adult) (pediatric): Secondary | ICD-10-CM

## 2018-06-01 DIAGNOSIS — G4710 Hypersomnia, unspecified: Secondary | ICD-10-CM | POA: Diagnosis not present

## 2018-06-01 MED ORDER — METHYLPHENIDATE HCL 10 MG PO TABS: 10.0000 mg | ORAL_TABLET | Freq: Three times a day (TID) | ORAL | 0 refills | Status: DC
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
## 2018-06-02 ENCOUNTER — Telehealth: Payer: Self-pay

## 2018-06-02 NOTE — Telephone Encounter (Signed)
Unable to get in contact with the patient to schedule their 6 month follow up with Amy. I left a voicemail asking the patient to return my call. Office number was provided.  If patient calls back please schedule their follow up appt.

## 2018-06-12 ENCOUNTER — Other Ambulatory Visit (INDEPENDENT_AMBULATORY_CARE_PROVIDER_SITE_OTHER): Payer: Self-pay | Admitting: Family Medicine

## 2018-06-12 DIAGNOSIS — E038 Other specified hypothyroidism: Secondary | ICD-10-CM

## 2018-06-14 ENCOUNTER — Encounter (INDEPENDENT_AMBULATORY_CARE_PROVIDER_SITE_OTHER): Payer: Self-pay | Admitting: Family Medicine

## 2018-06-14 ENCOUNTER — Ambulatory Visit (INDEPENDENT_AMBULATORY_CARE_PROVIDER_SITE_OTHER): Payer: Managed Care, Other (non HMO) | Admitting: Family Medicine

## 2018-06-14 ENCOUNTER — Other Ambulatory Visit: Payer: Self-pay

## 2018-06-14 DIAGNOSIS — E038 Other specified hypothyroidism: Secondary | ICD-10-CM | POA: Diagnosis not present

## 2018-06-14 DIAGNOSIS — E559 Vitamin D deficiency, unspecified: Secondary | ICD-10-CM

## 2018-06-14 DIAGNOSIS — Z6830 Body mass index (BMI) 30.0-30.9, adult: Secondary | ICD-10-CM

## 2018-06-14 DIAGNOSIS — E669 Obesity, unspecified: Secondary | ICD-10-CM | POA: Diagnosis not present

## 2018-06-14 MED ORDER — VITAMIN D (ERGOCALCIFEROL) 1.25 MG (50000 UNIT) PO CAPS: 50000.0000 [IU] | ORAL_CAPSULE | ORAL | 0 refills | Status: DC

## 2018-06-14 MED ORDER — LEVOTHYROXINE SODIUM 75 MCG PO TABS: 75.0000 ug | ORAL_TABLET | Freq: Every day | ORAL | 0 refills | Status: DC

## 2018-06-14 NOTE — Progress Notes (Signed)
Office: [phone]  /  Fax: [phone] TeleHealth Visit:  [name] has verbally consented to this TeleHealth visit today. The patient is located at home, the provider is located at the [facility] office. The participants in this visit include the listed provider and patient. The visit was conducted today via Face Time.  HPI:   Chief Complaint: OBESITY [name] is here to discuss his progress with his obesity treatment plan. He is on the Category 3 plan and is following his eating plan approximately 85 % of the time. He states he is building a stone patio and using the elliptical 30 minutes 4 times per week. Manveer continues to monitor his weight over the last 2 months. He has kept active and is exercising most days of the week. His hunger is controlled.  We were unable to weigh the patient today for this TeleHealth visit. He feels as if he has maintained weight since his last visit. He has lost 32 lbs since starting treatment with [name].  Vitamin D Deficiency [name] has a diagnosis of vitamin D deficiency. He is currently stable on vit D, but is not yet at goal. [name] denies nausea, vomiting, or muscle weakness.  Hypothyroid [name] has a diagnosis of hypothyroidism. He is stable on Synthroid. He denies hot or cold intolerance or palpitations.  ASSESSMENT AND PLAN:  Vitamin D deficiency - Plan: Vitamin D, Ergocalciferol, (DRISDOL) 1.25 MG (50000 UT) CAPS capsule  Other specified hypothyroidism - Plan: levothyroxine (SYNTHROID) 75 MCG tablet  Class 1 obesity with serious comorbidity and body mass index (BMI) of 30.0 to 30.9 in adult, unspecified obesity type - Starting BMI greater then 30  PLAN:  Vitamin D Deficiency [name] was informed that low vitamin D levels contribute to fatigue and are associated with obesity, breast, and colon cancer. Pink agrees to continue to take prescription Vit D @50 ,000 IU every week #4 with no refills and will follow up for routine  testing of vitamin D, at least 2-3 times per year. He was informed of the risk of over-replacement of vitamin D and agrees to not increase his dose unless he discusses this with [name] first. We will check labs at his next visit. [name] agrees to follow up in 4 weeks as directed.  Hypothyroid [name] was informed of the importance of good thyroid control to help with weight loss efforts. He was also informed that supertherapeutic thyroid levels are dangerous and will not improve weight loss results. We will order labs at his next visit. [name] agrees to continue Synthroid 75 mcg qd #30 with no refills and he will follow up in 4 weeks.  Obesity [name] is currently in the action stage of change. As such, his goal is to continue with weight loss efforts. He has agreed to follow the Category 3 plan. [name] has been instructed to work up to a goal of 150 minutes of combined cardio and strengthening exercise per week for weight loss and overall health benefits. We discussed the following Behavioral Modification Strategies today: increasing lean protein intake, increasing vegetables, work on meal planning and easy cooking plans, emotional eating strategies, and decrease liquid calories.  Kiko has agreed to follow up with our clinic in 4 weeks. He was informed of the importance of frequent follow up visits to maximize his success with intensive lifestyle modifications for his multiple health conditions.  ALLERGIES: No Known Allergies  MEDICATIONS: Current Outpatient Medications on File Prior to Visit  Medication Sig Dispense Refill  .  HUMIRA PEN 40 MG/0.8ML PNKT Inject 40 mg into the skin See admin instructions. Twice monthly (1st & 15th)  1  . HYDROcodone-acetaminophen (NORCO) 7.5-325 MG tablet Take 1 tablet by mouth every 6 (six) hours as needed for moderate pain. 20 tablet 0  . meloxicam (MOBIC) 15 MG tablet Take 15 mg by mouth daily as needed.     . methocarbamol (ROBAXIN) 750 MG tablet TAKE 1 TABLET  DAILY PRN  2  . methylphenidate (RITALIN) 10 MG tablet Take 1 tablet (10 mg total) by mouth 3 (three) times daily with meals. 90 tablet 0  . promethazine (PHENERGAN) 25 MG suppository Place 1 suppository (25 mg total) rectally every 6 (six) hours as needed for nausea or vomiting. 12 suppository 1  . traMADol (ULTRAM) 50 MG tablet Take by mouth every 6 (six) hours as needed.     Current Facility-Administered Medications on File Prior to Visit  Medication Dose Route Frequency Provider Last Rate Last Dose  . gadopentetate dimeglumine (MAGNEVIST) injection 20 mL  20 mL Intravenous Once PRN Dohmeier, Porfirio Mylar, MD        PAST MEDICAL HISTORY: Past Medical History:  Diagnosis Date  . ADD (attention deficit disorder)   . Adult night terrors 03/29/2014  . Alcohol abuse   . Ankylosing spondylitis (HCC)    anklosing spondolitis  . Anxiety   . Asthma    EXERCISE INDUCED  RARELY  USES INHALER  . Back pain   . Depression   . Drug use   . Fatty liver   . Hypothyroidism   . IBS (irritable bowel syndrome)   . Joint pain   . Night terrors   . Sleep apnea    SLEEPS W/ MOUTH GUARD     PAST SURGICAL HISTORY: Past Surgical History:  Procedure Laterality Date  . KNEE SURGERY Left   . LUMBAR LAMINECTOMY/DECOMPRESSION MICRODISCECTOMY N/A 01/14/2017   Procedure: LUMBAR 3-4 DECOMPRESSION;  Surgeon: Estill Bamberg, MD;  Location: [facility];  Service: Orthopedics;  Laterality: N/A;  LUMBAR 2-4 DECOMPRESSION / 2 HRS FOR CASE.  Marland Kitchen NASAL SEPTOPLASTY W/ TURBINOPLASTY Bilateral 12/28/2017   Procedure: NASAL SEPTOPLASTY WITH TURBINATE REDUCTION;  Surgeon: Serena Colonel, MD;  Location: [facility];  Service: ENT;  Laterality: Bilateral;  . TONSILLECTOMY AND ADENOIDECTOMY  1992  . WISDOM TOOTH EXTRACTION      SOCIAL HISTORY: Social History   Tobacco Use  . Smoking status: Former Smoker    Years: 4.00    Types: Cigarettes    Last attempt to quit: 01/16/1986    Years since quitting: 32.4  . Smokeless  tobacco: Never Used  Substance Use Topics  . Alcohol use: Yes    Alcohol/week: 0.0 standard drinks    Comment: Two drinks daily  . Drug use: Yes    Types: Marijuana    Comment: last used 12/27/17    FAMILY HISTORY: Family History  Problem Relation Age of Onset  . Aneurysm Mother   . Sudden death Mother   . Stroke Father   . Hypertension Father   . Thyroid disease Father   . Alcoholism Father   . Aneurysm Other        brain    ROS: Review of Systems  Cardiovascular: Negative for palpitations.  Gastrointestinal: Negative for nausea and vomiting.  Musculoskeletal:       Negative for muscle weakness.  Endo/Heme/Allergies:       Negative for heat intolerance. Negative for cold intolerance.    PHYSICAL EXAM: Pt in  no acute distress  RECENT LABS AND TESTS: BMET    Component Value Date/Time   NA 141 03/15/2018 1142   K 4.5 03/15/2018 1142   CL 104 03/15/2018 1142   CO2 22 03/15/2018 1142   GLUCOSE 81 03/15/2018 1142   GLUCOSE 124 (H) 01/09/2017 1231   BUN 21 03/15/2018 1142   CREATININE 0.99 03/15/2018 1142   CALCIUM 9.4 03/15/2018 1142   GFRNONAA 87 03/15/2018 1142   GFRAA 100 03/15/2018 1142   Lab Results  Component Value Date   HGBA1C 5.1 03/15/2018   HGBA1C 5.2 11/02/2017   HGBA1C 5.1 06/22/2017   HGBA1C 5.3 02/16/2017   Lab Results  Component Value Date   INSULIN 6.1 03/15/2018   INSULIN 8.8 11/02/2017   INSULIN 8.0 06/22/2017   INSULIN 10.0 02/16/2017   CBC    Component Value Date/Time   WBC 4.7 06/22/2017 1048   WBC 5.5 01/09/2017 1231   RBC 5.55 06/22/2017 1048   RBC 5.50 01/09/2017 1231   HGB 15.5 06/22/2017 1048   HCT 46.5 06/22/2017 1048   PLT 173 01/09/2017 1231   MCV 84 06/22/2017 1048   MCH 27.9 06/22/2017 1048   MCH 27.6 01/09/2017 1231   MCHC 33.3 06/22/2017 1048   MCHC 33.1 01/09/2017 1231   RDW 14.5 06/22/2017 1048   LYMPHSABS 1.6 06/22/2017 1048   MONOABS 0.4 01/09/2017 1231   EOSABS 0.1 06/22/2017 1048   BASOSABS 0.0  06/22/2017 1048   Iron/TIBC/Ferritin/ %Sat No results found for: IRON, TIBC, FERRITIN, IRONPCTSAT Lipid Panel     Component Value Date/Time   CHOL 123 03/15/2018 1142   TRIG 85 03/15/2018 1142   HDL 36 (L) 03/15/2018 1142   LDLCALC 70 03/15/2018 1142   Hepatic Function Panel     Component Value Date/Time   PROT 7.0 03/15/2018 1142   ALBUMIN 4.9 03/15/2018 1142   AST 19 03/15/2018 1142   ALT 27 03/15/2018 1142   ALKPHOS 55 03/15/2018 1142   BILITOT 0.7 03/15/2018 1142      Component Value Date/Time   TSH 4.000 06/22/2017 1048   TSH 3.430 02/16/2017 1142   Results for CLIFF, DAMIANI (MRN [id number]) as of 06/14/2018 12:47  Ref. Range 11/02/2017 14:08  Vitamin D, 25-Hydroxy Latest Ref Range: 30.0 - 100.0 ng/mL 47.6     I, Kirke Corin, CMA, am acting as transcriptionist for Wilder Glade, MD I have reviewed the above documentation for accuracy and completeness, and I agree with the above. -Quillian Quince, MD

## 2018-07-08 ENCOUNTER — Other Ambulatory Visit (INDEPENDENT_AMBULATORY_CARE_PROVIDER_SITE_OTHER): Payer: Self-pay | Admitting: Family Medicine

## 2018-07-08 DIAGNOSIS — E038 Other specified hypothyroidism: Secondary | ICD-10-CM

## 2018-07-12 ENCOUNTER — Other Ambulatory Visit: Payer: Self-pay

## 2018-07-12 ENCOUNTER — Ambulatory Visit (INDEPENDENT_AMBULATORY_CARE_PROVIDER_SITE_OTHER): Payer: Managed Care, Other (non HMO) | Admitting: Family Medicine

## 2018-07-12 ENCOUNTER — Encounter (INDEPENDENT_AMBULATORY_CARE_PROVIDER_SITE_OTHER): Payer: Self-pay | Admitting: Family Medicine

## 2018-07-12 DIAGNOSIS — Z6830 Body mass index (BMI) 30.0-30.9, adult: Secondary | ICD-10-CM

## 2018-07-12 DIAGNOSIS — E66811 Obesity, class 1: Secondary | ICD-10-CM

## 2018-07-12 DIAGNOSIS — E038 Other specified hypothyroidism: Secondary | ICD-10-CM | POA: Diagnosis not present

## 2018-07-12 DIAGNOSIS — E559 Vitamin D deficiency, unspecified: Secondary | ICD-10-CM | POA: Diagnosis not present

## 2018-07-12 DIAGNOSIS — E063 Autoimmune thyroiditis: Secondary | ICD-10-CM

## 2018-07-12 DIAGNOSIS — E669 Obesity, unspecified: Secondary | ICD-10-CM

## 2018-07-13 NOTE — Progress Notes (Signed)
Office: [phone]  /  Fax: [phone] TeleHealth Visit:  Don Broacharold J Seki has verbally consented to this TeleHealth visit today. The patient is located at home, the provider is located at the [facility] office. The participants in this visit include the listed provider and patient. Raimundo was unable to use realtime audiovisual technology today and the telehealth visit was conducted via telephone.   HPI:   Chief Complaint: OBESITY Oslo is here to discuss his progress with his obesity treatment plan. He is on the Category 3 plan and is following his eating plan approximately 80 % of the time. He states he is on the elliptical, lifting weights, and doing yard work for 30 minutes 3-4 times per week. Jaylee feels he has maintained his weight well since his last visit. He has been much more physically active, but he hasn't followed his plan as well due to increased hunger that extra activity has caused.  We were unable to weigh the patient today for this TeleHealth visit. He feels as if he has maintained his weight since his last visit. He has lost 32 lbs since starting treatment with us.  Vitamin D Deficiency Albino has a diagnosis of vitamin D deficiency. He is stable on prescription Vit D, and he is due for labs. He denies nausea, vomiting or muscle weakness.  Hypothyroidism Taylen has a diagnosis of hypothyroidism. He is stable on Synthroid, but he is due for labs. He denies hot or cold intolerance or palpitations.  ASSESSMENT AND PLAN:  Vitamin D deficiency - Plan: VITAMIN D 25 Hydroxy (Vit-D Deficiency, Fractures), Comprehensive metabolic panel  Hypothyroidism due to Hashimoto's thyroiditis - Plan: TSH, T4, free, T3  Class 1 obesity with serious comorbidity and body mass index (BMI) of 30.0 to 30.9 in adult, unspecified obesity type  PLAN:  Vitamin D Deficiency Devonn was informed that low vitamin D levels contributes to fatigue and are associated with obesity,  breast, and colon cancer. Hayward agrees to continue taking prescription Vit D 50,000 IU every week #4 and we will refill for 1 month. He will follow up for routine testing of vitamin D, at least 2-3 times per year. He was informed of the risk of over-replacement of vitamin D and agrees to not increase his dose unless he discusses this with us first. We will check labs today. Johnnell agrees to follow up with our clinic in 5 weeks.  Hypothyroidism Kathryn was informed of the importance of good thyroid control to help with weight loss efforts. He was also informed that supertheraputic thyroid levels are dangerous and will not improve weight loss results. Rosalie agrees to continue taking Synthroid 75 mcg qd #30 and we will refill for 1 month. We will check labs today. Wilkie agrees to follow up with our clinic in 5 weeks.  Obesity Ioannis is currently in the action stage of change. As such, his goal is to continue with weight loss efforts He has agreed to follow the Category 3 plan Kinsler has been instructed to work up to a goal of 150 minutes of combined cardio and strengthening exercise per week or as is for weight loss and overall health benefits. We discussed the following Behavioral Modification Strategies today: work on meal planning and easy cooking plans, increase H20 intake, and planning for success   Betty has agreed to follow up with our clinic in 5 weeks. He was informed of the importance of frequent follow up visits to maximize his success with intensive lifestyle modifications  for his multiple health conditions.  ALLERGIES: No Known Allergies  MEDICATIONS: Current Outpatient Medications on File Prior to Visit  Medication Sig Dispense Refill  . HUMIRA PEN 40 MG/0.8ML PNKT Inject 40 mg into the skin See admin instructions. Twice monthly (1st & 15th)  1  . HYDROcodone-acetaminophen (NORCO) 7.5-325 MG tablet Take 1 tablet by mouth every 6 (six) hours as needed for moderate pain. 20 tablet 0   . levothyroxine (SYNTHROID) 75 MCG tablet Take 1 tablet (75 mcg total) by mouth daily before breakfast. 30 tablet 0  . meloxicam (MOBIC) 15 MG tablet Take 15 mg by mouth daily as needed.     . methocarbamol (ROBAXIN) 750 MG tablet TAKE 1 TABLET DAILY PRN  2  . methylphenidate (RITALIN) 10 MG tablet Take 1 tablet (10 mg total) by mouth 3 (three) times daily with meals. 90 tablet 0  . promethazine (PHENERGAN) 25 MG suppository Place 1 suppository (25 mg total) rectally every 6 (six) hours as needed for nausea or vomiting. 12 suppository 1  . traMADol (ULTRAM) 50 MG tablet Take by mouth every 6 (six) hours as needed.    . Vitamin D, Ergocalciferol, (DRISDOL) 1.25 MG (50000 UT) CAPS capsule Take 1 capsule (50,000 Units total) by mouth every 7 (seven) days. 4 capsule 0   Current Facility-Administered Medications on File Prior to Visit  Medication Dose Route Frequency Provider Last Rate Last Dose  . gadopentetate dimeglumine (MAGNEVIST) injection 20 mL  20 mL Intravenous Once PRN Dohmeier, Porfirio Mylararmen, MD        PAST MEDICAL HISTORY: Past Medical History:  Diagnosis Date  . ADD (attention deficit disorder)   . Adult night terrors 03/29/2014  . Alcohol abuse   . Ankylosing spondylitis (HCC)    anklosing spondolitis  . Anxiety   . Asthma    EXERCISE INDUCED  RARELY  USES INHALER  . Back pain   . Depression   . Drug use   . Fatty liver   . Hypothyroidism   . IBS (irritable bowel syndrome)   . Joint pain   . Night terrors   . Sleep apnea    SLEEPS W/ MOUTH GUARD     PAST SURGICAL HISTORY: Past Surgical History:  Procedure Laterality Date  . KNEE SURGERY Left   . LUMBAR LAMINECTOMY/DECOMPRESSION MICRODISCECTOMY N/A 01/14/2017   Procedure: LUMBAR 3-4 DECOMPRESSION;  Surgeon: Estill Bambergumonski, Mark, MD;  Location: [facility] OR;  Service: Orthopedics;  Laterality: N/A;  LUMBAR 2-4 DECOMPRESSION / 2 HRS FOR CASE.  Marland Kitchen. NASAL SEPTOPLASTY W/ TURBINOPLASTY Bilateral 12/28/2017   Procedure: NASAL SEPTOPLASTY WITH  TURBINATE REDUCTION;  Surgeon: Serena Colonelosen, Jefry, MD;  Location: [facility];  Service: ENT;  Laterality: Bilateral;  . TONSILLECTOMY AND ADENOIDECTOMY  1992  . WISDOM TOOTH EXTRACTION      SOCIAL HISTORY: Social History   Tobacco Use  . Smoking status: Former Smoker    Years: 4.00    Types: Cigarettes    Quit date: 01/16/1986    Years since quitting: 32.5  . Smokeless tobacco: Never Used  Substance Use Topics  . Alcohol use: Yes    Alcohol/week: 0.0 standard drinks    Comment: Two drinks daily  . Drug use: Yes    Types: Marijuana    Comment: last used 12/27/17    FAMILY HISTORY: Family History  Problem Relation Age of Onset  . Aneurysm Mother   . Sudden death Mother   . Stroke Father   . Hypertension Father   . Thyroid disease  Father   . Alcoholism Father   . Aneurysm Other        brain    ROS: Review of Systems  Constitutional: Negative for weight loss.  Cardiovascular: Negative for palpitations.  Gastrointestinal: Negative for nausea and vomiting.  Musculoskeletal:       Negative muscle weakness  Endo/Heme/Allergies:       Negative hot/cold intolerance    PHYSICAL EXAM: Pt in no acute distress  RECENT LABS AND TESTS: BMET    Component Value Date/Time   NA 141 03/15/2018 1142   K 4.5 03/15/2018 1142   CL 104 03/15/2018 1142   CO2 22 03/15/2018 1142   GLUCOSE 81 03/15/2018 1142   GLUCOSE 124 (H) 01/09/2017 1231   BUN 21 03/15/2018 1142   CREATININE 0.99 03/15/2018 1142   CALCIUM 9.4 03/15/2018 1142   GFRNONAA 87 03/15/2018 1142   GFRAA 100 03/15/2018 1142   Lab Results  Component Value Date   HGBA1C 5.1 03/15/2018   HGBA1C 5.2 11/02/2017   HGBA1C 5.1 06/22/2017   HGBA1C 5.3 02/16/2017   Lab Results  Component Value Date   INSULIN 6.1 03/15/2018   INSULIN 8.8 11/02/2017   INSULIN 8.0 06/22/2017   INSULIN 10.0 02/16/2017   CBC    Component Value Date/Time   WBC 4.7 06/22/2017 1048   WBC 5.5 01/09/2017 1231   RBC 5.55  06/22/2017 1048   RBC 5.50 01/09/2017 1231   HGB 15.5 06/22/2017 1048   HCT 46.5 06/22/2017 1048   PLT 173 01/09/2017 1231   MCV 84 06/22/2017 1048   MCH 27.9 06/22/2017 1048   MCH 27.6 01/09/2017 1231   MCHC 33.3 06/22/2017 1048   MCHC 33.1 01/09/2017 1231   RDW 14.5 06/22/2017 1048   LYMPHSABS 1.6 06/22/2017 1048   MONOABS 0.4 01/09/2017 1231   EOSABS 0.1 06/22/2017 1048   BASOSABS 0.0 06/22/2017 1048   Iron/TIBC/Ferritin/ %Sat No results found for: IRON, TIBC, FERRITIN, IRONPCTSAT Lipid Panel     Component Value Date/Time   CHOL 123 03/15/2018 1142   TRIG 85 03/15/2018 1142   HDL 36 (L) 03/15/2018 1142   LDLCALC 70 03/15/2018 1142   Hepatic Function Panel     Component Value Date/Time   PROT 7.0 03/15/2018 1142   ALBUMIN 4.9 03/15/2018 1142   AST 19 03/15/2018 1142   ALT 27 03/15/2018 1142   ALKPHOS 55 03/15/2018 1142   BILITOT 0.7 03/15/2018 1142      Component Value Date/Time   TSH 4.000 06/22/2017 1048   TSH 3.430 02/16/2017 1142      I, Trixie Dredge, am acting as Location manager for Dennard Nip, MD I have reviewed the above documentation for accuracy and completeness, and I agree with the above. -Dennard Nip, MD

## 2018-07-15 ENCOUNTER — Other Ambulatory Visit (INDEPENDENT_AMBULATORY_CARE_PROVIDER_SITE_OTHER): Payer: Self-pay

## 2018-07-15 MED ORDER — LEVOTHYROXINE SODIUM 75 MCG PO TABS: 75.0000 ug | ORAL_TABLET | Freq: Every day | ORAL | 0 refills | Status: DC

## 2018-07-15 MED ORDER — VITAMIN D (ERGOCALCIFEROL) 1.25 MG (50000 UNIT) PO CAPS: 50000.0000 [IU] | ORAL_CAPSULE | ORAL | 0 refills | Status: DC

## 2018-07-28 ENCOUNTER — Telehealth: Payer: Self-pay | Admitting: Family Medicine

## 2018-08-05 ENCOUNTER — Telehealth (INDEPENDENT_AMBULATORY_CARE_PROVIDER_SITE_OTHER): Payer: Self-pay | Admitting: Family Medicine

## 2018-08-05 NOTE — Telephone Encounter (Signed)
Spoke to pt,  rx sent to [facility] 07/15/2018,  he doesn't remember picking up but [facility] stated he did get it.  We will send again today.  Pt aware and understands insr may not cover it.  Armenta Erskin

## 2018-08-05 NOTE — Telephone Encounter (Signed)
Needs Vit D, [facility]

## 2018-08-07 IMAGING — CR DG CHEST 2V
2 series · 2 of 2 positions shown · non-contrast
Comparison: None.

CLINICAL DATA: Pre-op respiratory exam for lumbar spine
decompression.

EXAM:
CHEST  2 VIEW

[w chest pa]
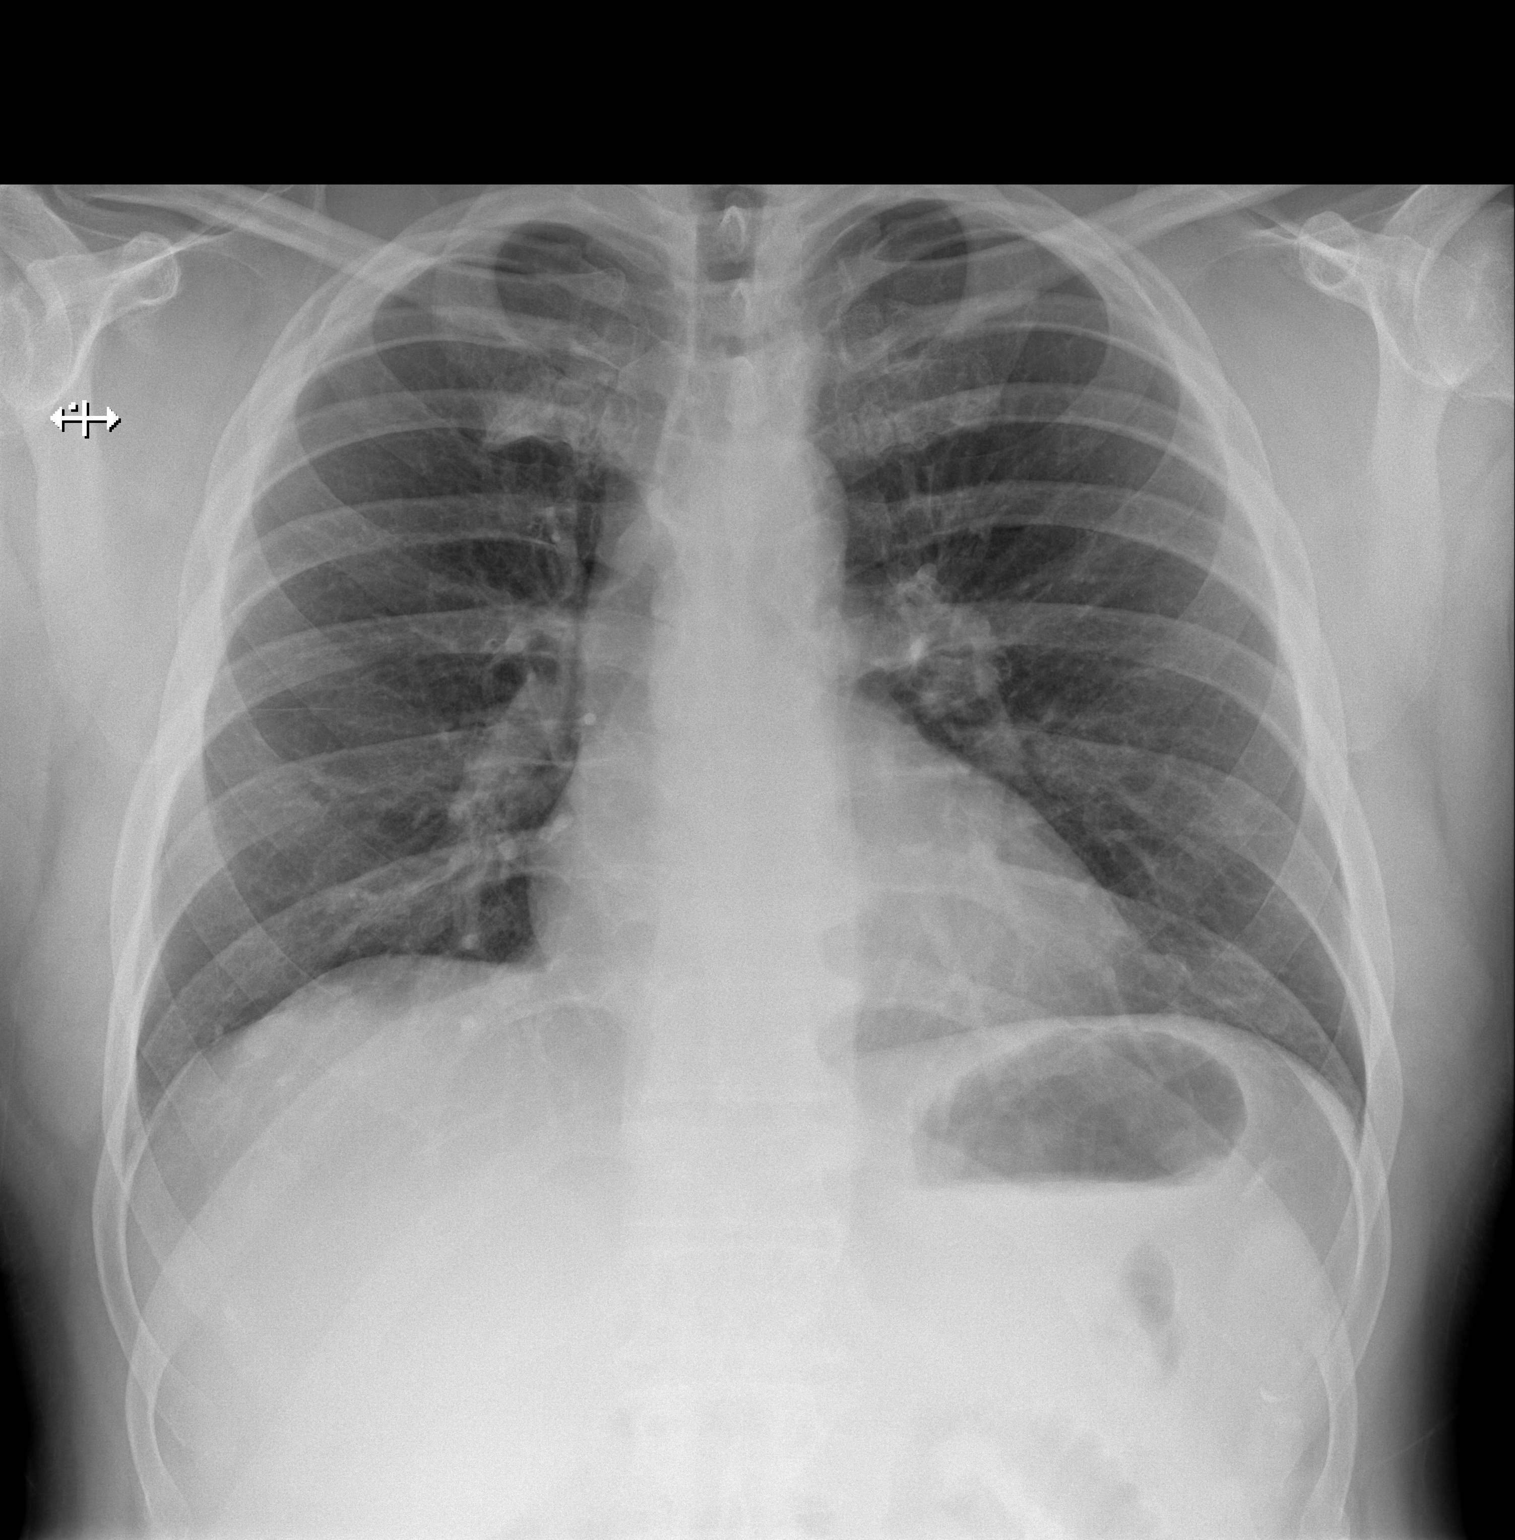

[w chest lat]
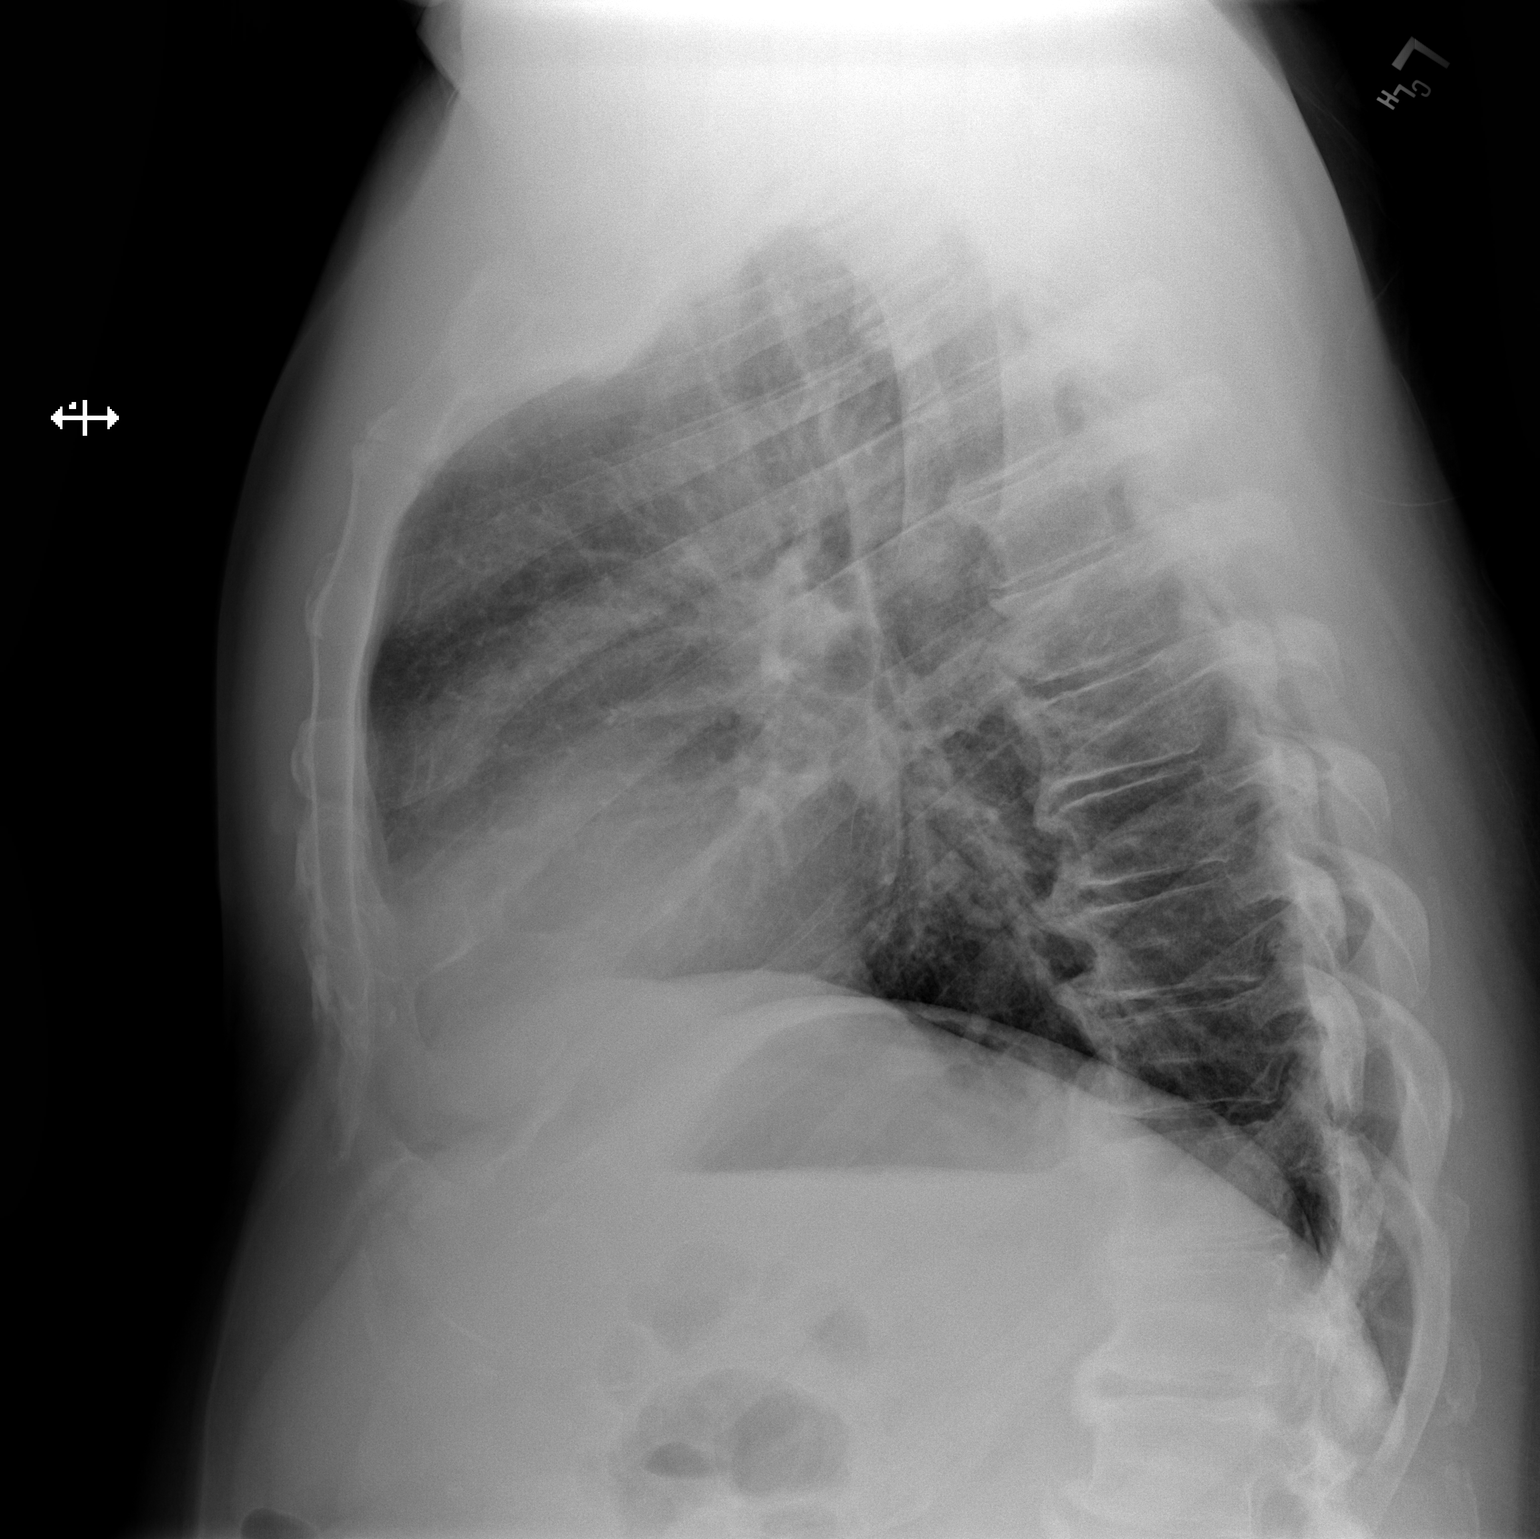

[2 of 2 positions shown; findings below may reference images not displayed]

FINDINGS: The heart size and mediastinal contours are within normal limits.
Both lungs are clear. The visualized skeletal structures are
unremarkable.
IMPRESSION: No active cardiopulmonary disease.

## 2018-08-14 ENCOUNTER — Other Ambulatory Visit (INDEPENDENT_AMBULATORY_CARE_PROVIDER_SITE_OTHER): Payer: Self-pay | Admitting: Family Medicine

## 2018-08-14 DIAGNOSIS — E038 Other specified hypothyroidism: Secondary | ICD-10-CM

## 2018-08-18 ENCOUNTER — Other Ambulatory Visit: Payer: Self-pay

## 2018-08-18 ENCOUNTER — Encounter (INDEPENDENT_AMBULATORY_CARE_PROVIDER_SITE_OTHER): Payer: Self-pay

## 2018-08-18 ENCOUNTER — Telehealth (INDEPENDENT_AMBULATORY_CARE_PROVIDER_SITE_OTHER): Payer: Managed Care, Other (non HMO) | Admitting: Family Medicine

## 2018-08-18 ENCOUNTER — Encounter (INDEPENDENT_AMBULATORY_CARE_PROVIDER_SITE_OTHER): Payer: Self-pay | Admitting: Family Medicine

## 2018-08-18 DIAGNOSIS — E559 Vitamin D deficiency, unspecified: Secondary | ICD-10-CM

## 2018-08-18 DIAGNOSIS — Z6831 Body mass index (BMI) 31.0-31.9, adult: Secondary | ICD-10-CM

## 2018-08-18 DIAGNOSIS — E038 Other specified hypothyroidism: Secondary | ICD-10-CM | POA: Diagnosis not present

## 2018-08-18 DIAGNOSIS — E669 Obesity, unspecified: Secondary | ICD-10-CM | POA: Diagnosis not present

## 2018-08-18 MED ORDER — VITAMIN D (ERGOCALCIFEROL) 1.25 MG (50000 UNIT) PO CAPS: 50000.0000 [IU] | ORAL_CAPSULE | ORAL | 0 refills | Status: DC

## 2018-08-18 MED ORDER — LEVOTHYROXINE SODIUM 75 MCG PO TABS: 75.0000 ug | ORAL_TABLET | Freq: Every day | ORAL | 0 refills | Status: DC

## 2018-08-19 NOTE — Progress Notes (Signed)
Office: [phone]  /  Fax: [phone] TeleHealth Visit:  [name] has verbally consented to this TeleHealth visit today. The patient is located at home, the provider is located at the [facility] office. The participants in this visit include the listed provider and patient. [name] was unable to use realtime audiovisual technology today and the telehealth visit was conducted via telephone.   HPI:   Chief Complaint: OBESITY [name] is here to discuss his progress with his obesity treatment plan. He is on the Category 3 plan and is following his eating plan approximately 85 % of the time. He states he is on the elliptical for 20-25 minutes 3 times per week, and doing home projects. [name] feels he has done well maintaining his weight. He is following the spinet of his plan, but not the details. He is active with projects like laying a stone patio and building a deck. We were unable to weigh the patient today for this TeleHealth visit. He feels as if he has maintained his weight since his last visit. He has lost 32 lbs since starting treatment with [name].  Vitamin D Deficiency [name] has a diagnosis of vitamin D deficiency. He is stable on prescription Vit D and denies nausea, vomiting or muscle weakness. He is due for labs soon.  Hypothyroidism [name] has a diagnosis of hypothyroidism. He is stable on levothyroxine. He denies hot or cold intolerance, palpitations, or tremor.  ASSESSMENT AND PLAN:  Class 1 obesity with serious comorbidity and body mass index (BMI) of 31.0 to 31.9 in adult, unspecified obesity type  Vitamin D deficiency - Plan: Vitamin D, Ergocalciferol, (DRISDOL) 1.25 MG (50000 UT) CAPS capsule  Other specified hypothyroidism - Plan: levothyroxine (SYNTHROID) 75 MCG tablet  PLAN:  Vitamin D Deficiency [name] was informed that low vitamin D levels contributes to fatigue and are associated with obesity, breast, and colon cancer. [name] agrees to  continue taking prescription Vit D 50,000 IU every week #4 and we will refill for 1 month. He will follow up for routine testing of vitamin D, at least 2-3 times per year. He was informed of the risk of over-replacement of vitamin D and agrees to not increase his dose unless he discusses this with [name] first. We will recheck labs at next appointment. [name] agrees to follow up with our clinic in 3 weeks.  Hypothyroidism [name] was informed of the importance of good thyroid control to help with weight loss efforts. He was also informed that supertheraputic thyroid levels are dangerous and will not improve weight loss results. [name] agrees to continue taking levothyroxine 75 mcg q daily #30 and we will refill for 1 month. We will recheck labs at next appointment. [name] agrees to follow up with our clinic in 3 weeks.  I spent > than 50% of the 25 minute visit on counseling as documented in the note.  Obesity [name] is currently in the action stage of change. As such, his goal is to continue with weight loss efforts He has agreed to follow the Category 3 plan [name] has been instructed to work up to a goal of 150 minutes of combined cardio and strengthening exercise per week for weight loss and overall health benefits. We discussed the following Behavioral Modification Strategies today: increasing lean protein intake and decrease eating out   [name] has agreed to follow up with our clinic in 3 weeks. He was informed of the importance of frequent follow up visits to maximize his success with  intensive lifestyle modifications for his multiple health conditions.  ALLERGIES: No Known Allergies  MEDICATIONS: Current Outpatient Medications on File Prior to Visit  Medication Sig Dispense Refill  . HUMIRA PEN 40 MG/0.8ML PNKT Inject 40 mg into the skin See admin instructions. Twice monthly (1st & 15th)  1  . HYDROcodone-acetaminophen (NORCO) 7.5-325 MG tablet Take 1 tablet by mouth every 6 (six) hours as  needed for moderate pain. 20 tablet 0  . meloxicam (MOBIC) 15 MG tablet Take 15 mg by mouth daily as needed.     . methocarbamol (ROBAXIN) 750 MG tablet TAKE 1 TABLET DAILY PRN  2  . methylphenidate (RITALIN) 10 MG tablet Take 1 tablet (10 mg total) by mouth 3 (three) times daily with meals. 90 tablet 0  . promethazine (PHENERGAN) 25 MG suppository Place 1 suppository (25 mg total) rectally every 6 (six) hours as needed for nausea or vomiting. 12 suppository 1  . traMADol (ULTRAM) 50 MG tablet Take by mouth every 6 (six) hours as needed.     Current Facility-Administered Medications on File Prior to Visit  Medication Dose Route Frequency Provider Last Rate Last Dose  . gadopentetate dimeglumine (MAGNEVIST) injection 20 mL  20 mL Intravenous Once PRN Dohmeier, Asencion Partridge, MD        PAST MEDICAL HISTORY: Past Medical History:  Diagnosis Date  . ADD (attention deficit disorder)   . Adult night terrors 03/29/2014  . Alcohol abuse   . Ankylosing spondylitis (HCC)    anklosing spondolitis  . Anxiety   . Asthma    EXERCISE INDUCED  RARELY  USES INHALER  . Back pain   . Depression   . Drug use   . Fatty liver   . Hypothyroidism   . IBS (irritable bowel syndrome)   . Joint pain   . Night terrors   . Sleep apnea    SLEEPS W/ MOUTH GUARD     PAST SURGICAL HISTORY: Past Surgical History:  Procedure Laterality Date  . KNEE SURGERY Left   . LUMBAR LAMINECTOMY/DECOMPRESSION MICRODISCECTOMY N/A 01/14/2017   Procedure: LUMBAR 3-4 DECOMPRESSION;  Surgeon: Phylliss Bob, MD;  Location: [place];  Service: Orthopedics;  Laterality: N/A;  LUMBAR 2-4 DECOMPRESSION / 2 HRS FOR CASE.  Marland Kitchen NASAL SEPTOPLASTY W/ TURBINOPLASTY Bilateral 12/28/2017   Procedure: NASAL SEPTOPLASTY WITH TURBINATE REDUCTION;  Surgeon: Izora Gala, MD;  Location: [place];  Service: ENT;  Laterality: Bilateral;  . TONSILLECTOMY AND ADENOIDECTOMY  1992  . WISDOM TOOTH EXTRACTION      SOCIAL HISTORY: Social  History   Tobacco Use  . Smoking status: Former Smoker    Years: 4.00    Types: Cigarettes    Quit date: 01/16/1986    Years since quitting: 32.6  . Smokeless tobacco: Never Used  Substance Use Topics  . Alcohol use: Yes    Alcohol/week: 0.0 standard drinks    Comment: Two drinks daily  . Drug use: Yes    Types: Marijuana    Comment: last used 12/27/17    FAMILY HISTORY: Family History  Problem Relation Age of Onset  . Aneurysm Mother   . Sudden death Mother   . Stroke Father   . Hypertension Father   . Thyroid disease Father   . Alcoholism Father   . Aneurysm Other        brain    ROS: Review of Systems  Constitutional: Negative for weight loss.  Cardiovascular: Negative for palpitations.  Gastrointestinal: Negative for nausea and vomiting.  Musculoskeletal:       Negative muscle weakness  Neurological: Negative for tremors.  Endo/Heme/Allergies:       Negative hot/cold intolerance    PHYSICAL EXAM: Pt in no acute distress  RECENT LABS AND TESTS: BMET    Component Value Date/Time   NA 141 03/15/2018 1142   K 4.5 03/15/2018 1142   CL 104 03/15/2018 1142   CO2 22 03/15/2018 1142   GLUCOSE 81 03/15/2018 1142   GLUCOSE 124 (H) 01/09/2017 1231   BUN 21 03/15/2018 1142   CREATININE 0.99 03/15/2018 1142   CALCIUM 9.4 03/15/2018 1142   GFRNONAA 87 03/15/2018 1142   GFRAA 100 03/15/2018 1142   Lab Results  Component Value Date   HGBA1C 5.1 03/15/2018   HGBA1C 5.2 11/02/2017   HGBA1C 5.1 06/22/2017   HGBA1C 5.3 02/16/2017   Lab Results  Component Value Date   INSULIN 6.1 03/15/2018   INSULIN 8.8 11/02/2017   INSULIN 8.0 06/22/2017   INSULIN 10.0 02/16/2017   CBC    Component Value Date/Time   WBC 4.7 06/22/2017 1048   WBC 5.5 01/09/2017 1231   RBC 5.55 06/22/2017 1048   RBC 5.50 01/09/2017 1231   HGB 15.5 06/22/2017 1048   HCT 46.5 06/22/2017 1048   PLT 173 01/09/2017 1231   MCV 84 06/22/2017 1048   MCH 27.9 06/22/2017 1048   MCH 27.6  01/09/2017 1231   MCHC 33.3 06/22/2017 1048   MCHC 33.1 01/09/2017 1231   RDW 14.5 06/22/2017 1048   LYMPHSABS 1.6 06/22/2017 1048   MONOABS 0.4 01/09/2017 1231   EOSABS 0.1 06/22/2017 1048   BASOSABS 0.0 06/22/2017 1048   Iron/TIBC/Ferritin/ %Sat No results found for: IRON, TIBC, FERRITIN, IRONPCTSAT Lipid Panel     Component Value Date/Time   CHOL 123 03/15/2018 1142   TRIG 85 03/15/2018 1142   HDL 36 (L) 03/15/2018 1142   LDLCALC 70 03/15/2018 1142   Hepatic Function Panel     Component Value Date/Time   PROT 7.0 03/15/2018 1142   ALBUMIN 4.9 03/15/2018 1142   AST 19 03/15/2018 1142   ALT 27 03/15/2018 1142   ALKPHOS 55 03/15/2018 1142   BILITOT 0.7 03/15/2018 1142      Component Value Date/Time   TSH 4.000 06/22/2017 1048   TSH 3.430 02/16/2017 1142      I, Burt KnackSharon Martin, am acting as Energy managertranscriptionist for Quillian Quincearen Koray Soter, MD I have reviewed the above documentation for accuracy and completeness, and I agree with the above. -Quillian Quincearen Makia Bossi, MD

## 2018-09-06 ENCOUNTER — Other Ambulatory Visit: Payer: Self-pay | Admitting: Neurology

## 2018-09-06 ENCOUNTER — Telehealth: Payer: Self-pay | Admitting: Nurse Practitioner

## 2018-09-06 DIAGNOSIS — F901 Attention-deficit hyperactivity disorder, predominantly hyperactive type: Secondary | ICD-10-CM

## 2018-09-06 DIAGNOSIS — G4730 Sleep apnea, unspecified: Secondary | ICD-10-CM

## 2018-09-06 DIAGNOSIS — G4733 Obstructive sleep apnea (adult) (pediatric): Secondary | ICD-10-CM

## 2018-09-06 DIAGNOSIS — G4710 Hypersomnia, unspecified: Secondary | ICD-10-CM

## 2018-09-06 MED ORDER — METHYLPHENIDATE HCL 10 MG PO TABS: 10.0000 mg | ORAL_TABLET | Freq: Three times a day (TID) | ORAL | 0 refills | Status: DC

## 2018-09-06 NOTE — Telephone Encounter (Signed)
I have routed this request to Dr Dohmeier for review. The pt is due for the medication and [place] registry was verified.  

## 2018-09-06 NOTE — Telephone Encounter (Signed)
Pt is requesting a refill on his methylphenidate (RITALIN) 10 MG tablet  [id number]

## 2018-09-07 ENCOUNTER — Ambulatory Visit (INDEPENDENT_AMBULATORY_CARE_PROVIDER_SITE_OTHER): Payer: Managed Care, Other (non HMO) | Admitting: Family Medicine

## 2018-09-07 ENCOUNTER — Other Ambulatory Visit: Payer: Self-pay

## 2018-09-07 ENCOUNTER — Encounter (INDEPENDENT_AMBULATORY_CARE_PROVIDER_SITE_OTHER): Payer: Self-pay | Admitting: Family Medicine

## 2018-09-07 VITALS — BP 133/76 | HR 57 | Temp 97.6°F | Ht 72.0 in | Wt 226.0 lb

## 2018-09-07 DIAGNOSIS — E559 Vitamin D deficiency, unspecified: Secondary | ICD-10-CM | POA: Diagnosis not present

## 2018-09-07 DIAGNOSIS — E8881 Metabolic syndrome: Secondary | ICD-10-CM

## 2018-09-07 DIAGNOSIS — Z9189 Other specified personal risk factors, not elsewhere classified: Secondary | ICD-10-CM | POA: Diagnosis not present

## 2018-09-07 DIAGNOSIS — E038 Other specified hypothyroidism: Secondary | ICD-10-CM | POA: Diagnosis not present

## 2018-09-07 DIAGNOSIS — E669 Obesity, unspecified: Secondary | ICD-10-CM

## 2018-09-07 DIAGNOSIS — Z6830 Body mass index (BMI) 30.0-30.9, adult: Secondary | ICD-10-CM

## 2018-09-07 MED ORDER — VITAMIN D (ERGOCALCIFEROL) 1.25 MG (50000 UNIT) PO CAPS: 50000.0000 [IU] | ORAL_CAPSULE | ORAL | 0 refills | Status: DC

## 2018-09-07 MED ORDER — LEVOTHYROXINE SODIUM 75 MCG PO TABS: 75.0000 ug | ORAL_TABLET | Freq: Every day | ORAL | 0 refills | Status: DC

## 2018-09-07 NOTE — Progress Notes (Signed)
Office: [phone]  /  Fax: [phone]   HPI:   Chief Complaint: OBESITY Theadore is here to discuss his progress with his obesity treatment plan. He is on the Category 3 plan and is following his eating plan approximately 85 % of the time. He states he is using the elliptical 20 minutes 2 to 3 times per week. Seanmichael has gained 5 pounds over the last 5 months since his last in office visit before the Covid19 pandemic. Most of this weight gain is water weight. He is ok with maintaining his weight for now.  His weight is 226 lb (102.5 kg) today and has had a weight gain of 5 pounds over a period of 3 weeks since his last visit. He has lost 27 lbs since starting treatment with [name].  Vitamin D Deficiency Javel has a diagnosis of vitamin D deficiency. He is currently stable on vit D, but is due for labs. Bronislaw denies nausea, vomiting, or muscle weakness.  At risk for osteopenia and osteoporosis Madox is at higher risk of osteopenia and osteoporosis due to vitamin D deficiency.   Hypothyroid Arlander has a diagnosis of hypothyroidism. He is stable on levothyroxine. He denies hot or cold intolerance or palpitations.  Insulin Resistance Doc has a diagnosis of insulin resistance based on his elevated fasting insulin level >5. Although Earvin's blood glucose readings are still under good control, insulin resistance puts him at greater risk of metabolic syndrome and diabetes. He is not taking metformin currently and is working on diet and exercise to decrease risk of diabetes. Takeru notes improved polyphagia and denies hypoglycemia.  ASSESSMENT AND PLAN:  Vitamin D deficiency - Plan: VITAMIN D 25 Hydroxy (Vit-D Deficiency, Fractures), Vitamin D, Ergocalciferol, (DRISDOL) 1.25 MG (50000 UT) CAPS capsule  Other specified hypothyroidism - Plan: T3, T4, free, TSH, levothyroxine (SYNTHROID) 75 MCG tablet  Insulin resistance - Plan: Comprehensive metabolic panel, Hemoglobin A1c, Insulin, random   At risk for osteoporosis  Class 1 obesity with serious comorbidity and body mass index (BMI) of 30.0 to 30.9 in adult, unspecified obesity type  PLAN:  Vitamin D Deficiency Luie was informed that low vitamin D levels contribute to fatigue and are associated with obesity, breast, and colon cancer. Chancelor agrees to continue to take prescription Vit D @50 ,000 IU every week #4 with no refills and will follow up for routine testing of vitamin D, at least 2-3 times per year. He was informed of the risk of over-replacement of vitamin D and agrees to not increase his dose unless he discusses this with [name] first. Ahlijah agrees to follow up in 4 weeks as directed.  At risk for osteopenia and osteoporosis Deja was given extended (30 minutes) osteoporosis prevention counseling today. Edrei is at risk for osteopenia and osteoporosis due to his vitamin D deficiency. He was encouraged to take his vitamin D and follow his higher calcium diet and increase strengthening exercise to help strengthen his bones and decrease his risk of osteopenia and osteoporosis.  Hypothyroid Stryker was informed of the importance of good thyroid control to help with weight loss efforts. He was also informed that supertherapeutic thyroid levels are dangerous and will not improve weight loss results. Guilford agrees to continue levothyroxine 75 mcg before breakfast qd #30 with no refills and to follow up at the agreed upon time.  Insulin Resistance Brek will continue to work on weight loss, exercise, and decreasing simple carbohydrates in his diet to help decrease the risk of diabetes. He was informed  that eating too many simple carbohydrates or too many calories at one sitting increases the likelihood of GI side effects. He agrees to continue diet and exercise and we will order labs today. Almir agreed to follow up with us as directed to monitor his progress in 4 weeks.  Obesity Bayron is currently in the action stage of  change. As such, his goal is to maintain weight for now. He has agreed to follow the Category 3 plan. Emoni has been instructed to continue using the elliptical and to increase resistance. We discussed the following Behavioral Modification Strategies today: decrease liquid calories and to increase H2O intake.  Challen has agreed to follow up with our clinic in 4 weeks. He was informed of the importance of frequent follow up visits to maximize his success with intensive lifestyle modifications for his multiple health conditions.  ALLERGIES: No Known Allergies  MEDICATIONS: Current Outpatient Medications on File Prior to Visit  Medication Sig Dispense Refill  . HUMIRA PEN 40 MG/0.8ML PNKT Inject 40 mg into the skin See admin instructions. Twice monthly (1st & 15th)  1  . HYDROcodone-acetaminophen (NORCO) 7.5-325 MG tablet Take 1 tablet by mouth every 6 (six) hours as needed for moderate pain. 20 tablet 0  . meloxicam (MOBIC) 15 MG tablet Take 15 mg by mouth daily as needed.     . methocarbamol (ROBAXIN) 750 MG tablet TAKE 1 TABLET DAILY PRN  2  . methylphenidate (RITALIN) 10 MG tablet Take 1 tablet (10 mg total) by mouth 3 (three) times daily with meals. 90 tablet 0  . traMADol (ULTRAM) 50 MG tablet Take by mouth every 6 (six) hours as needed.     Current Facility-Administered Medications on File Prior to Visit  Medication Dose Route Frequency Provider Last Rate Last Dose  . gadopentetate dimeglumine (MAGNEVIST) injection 20 mL  20 mL Intravenous Once PRN Dohmeier, Porfirio Mylararmen, MD        PAST MEDICAL HISTORY: Past Medical History:  Diagnosis Date  . ADD (attention deficit disorder)   . Adult night terrors 03/29/2014  . Alcohol abuse   . Ankylosing spondylitis (HCC)    anklosing spondolitis  . Anxiety   . Asthma    EXERCISE INDUCED  RARELY  USES INHALER  . Back pain   . Depression   . Drug use   . Fatty liver   . Hypothyroidism   . IBS (irritable bowel syndrome)   . Joint pain   .  Night terrors   . Sleep apnea    SLEEPS W/ MOUTH GUARD     PAST SURGICAL HISTORY: Past Surgical History:  Procedure Laterality Date  . KNEE SURGERY Left   . LUMBAR LAMINECTOMY/DECOMPRESSION MICRODISCECTOMY N/A 01/14/2017   Procedure: LUMBAR 3-4 DECOMPRESSION;  Surgeon: Estill Bambergumonski, Mark, MD;  Location: [facility];  Service: Orthopedics;  Laterality: N/A;  LUMBAR 2-4 DECOMPRESSION / 2 HRS FOR CASE.  Marland Kitchen. NASAL SEPTOPLASTY W/ TURBINOPLASTY Bilateral 12/28/2017   Procedure: NASAL SEPTOPLASTY WITH TURBINATE REDUCTION;  Surgeon: Serena Colonelosen, Jefry, MD;  Location: [facility];  Service: ENT;  Laterality: Bilateral;  . TONSILLECTOMY AND ADENOIDECTOMY  1992  . WISDOM TOOTH EXTRACTION      SOCIAL HISTORY: Social History   Tobacco Use  . Smoking status: Former Smoker    Years: 4.00    Types: Cigarettes    Quit date: 01/16/1986    Years since quitting: 32.6  . Smokeless tobacco: Never Used  Substance Use Topics  . Alcohol use: Yes    Alcohol/week:  0.0 standard drinks    Comment: Two drinks daily  . Drug use: Yes    Types: Marijuana    Comment: last used 12/27/17    FAMILY HISTORY: Family History  Problem Relation Age of Onset  . Aneurysm Mother   . Sudden death Mother   . Stroke Father   . Hypertension Father   . Thyroid disease Father   . Alcoholism Father   . Aneurysm Other        brain    ROS: Review of Systems  Constitutional: Negative for weight loss.  Cardiovascular: Negative for palpitations.  Gastrointestinal: Negative for nausea and vomiting.  Musculoskeletal:       Negative for muscle weakness.  Endo/Heme/Allergies:       Negative for heat intolerance. Negative for cold intolerance. Positive for polyphagia. Negative for hypoglycemia.    PHYSICAL EXAM: Blood pressure 133/76, pulse (!) 57, temperature 97.6 F (36.4 C), temperature source Oral, height 6' (1.829 m), weight 226 lb (102.5 kg), SpO2 98 %. Body mass index is 30.65 kg/m. Physical Exam Vitals signs  reviewed.  Constitutional:      Appearance: Normal appearance. He is obese.  Cardiovascular:     Rate and Rhythm: Normal rate.  Pulmonary:     Effort: Pulmonary effort is normal.  Musculoskeletal: Normal range of motion.  Skin:    General: Skin is warm and dry.  Neurological:     Mental Status: He is alert and oriented to person, place, and time.  Psychiatric:        Mood and Affect: Mood normal.        Behavior: Behavior normal.     RECENT LABS AND TESTS: BMET    Component Value Date/Time   NA 141 03/15/2018 1142   K 4.5 03/15/2018 1142   CL 104 03/15/2018 1142   CO2 22 03/15/2018 1142   GLUCOSE 81 03/15/2018 1142   GLUCOSE 124 (H) 01/09/2017 1231   BUN 21 03/15/2018 1142   CREATININE 0.99 03/15/2018 1142   CALCIUM 9.4 03/15/2018 1142   GFRNONAA 87 03/15/2018 1142   GFRAA 100 03/15/2018 1142   Lab Results  Component Value Date   HGBA1C 5.1 03/15/2018   HGBA1C 5.2 11/02/2017   HGBA1C 5.1 06/22/2017   HGBA1C 5.3 02/16/2017   Lab Results  Component Value Date   INSULIN 6.1 03/15/2018   INSULIN 8.8 11/02/2017   INSULIN 8.0 06/22/2017   INSULIN 10.0 02/16/2017   CBC    Component Value Date/Time   WBC 4.7 06/22/2017 1048   WBC 5.5 01/09/2017 1231   RBC 5.55 06/22/2017 1048   RBC 5.50 01/09/2017 1231   HGB 15.5 06/22/2017 1048   HCT 46.5 06/22/2017 1048   PLT 173 01/09/2017 1231   MCV 84 06/22/2017 1048   MCH 27.9 06/22/2017 1048   MCH 27.6 01/09/2017 1231   MCHC 33.3 06/22/2017 1048   MCHC 33.1 01/09/2017 1231   RDW 14.5 06/22/2017 1048   LYMPHSABS 1.6 06/22/2017 1048   MONOABS 0.4 01/09/2017 1231   EOSABS 0.1 06/22/2017 1048   BASOSABS 0.0 06/22/2017 1048   Iron/TIBC/Ferritin/ %Sat No results found for: IRON, TIBC, FERRITIN, IRONPCTSAT Lipid Panel     Component Value Date/Time   CHOL 123 03/15/2018 1142   TRIG 85 03/15/2018 1142   HDL 36 (L) 03/15/2018 1142   LDLCALC 70 03/15/2018 1142   Hepatic Function Panel     Component Value Date/Time    PROT 7.0 03/15/2018 1142   ALBUMIN 4.9 03/15/2018 1142  AST 19 03/15/2018 1142   ALT 27 03/15/2018 1142   ALKPHOS 55 03/15/2018 1142   BILITOT 0.7 03/15/2018 1142      Component Value Date/Time   TSH 4.000 06/22/2017 1048   TSH 3.430 02/16/2017 1142   Results for MACIAH, RAMPONE (MRN [id number]) as of 09/07/2018 11:23  Ref. Range 11/02/2017 14:08  Vitamin D, 25-Hydroxy Latest Ref Range: 30.0 - 100.0 ng/mL 47.6     OBESITY BEHAVIORAL INTERVENTION VISIT  Today's visit was # 23  Starting weight: 253 lbs Starting date: 02/16/17 Today's weight : Weight: 226 lb (102.5 kg)  Today's date: 09/07/2018 Total lbs lost to date: 27    09/07/2018  Height 6' (1.829 m)  Weight 226 lb (102.5 kg)  BMI (Calculated) 30.64  BLOOD PRESSURE - SYSTOLIC 133  BLOOD PRESSURE - DIASTOLIC 76   Body Fat % 27.8 %  Total Body Water (lbs) 111.8 lbs    ASK: We discussed the diagnosis of obesity with Jazmin J Cinnamon today and Bosten agreed to give [name] permission to discuss obesity behavioral modification therapy today.  ASSESS: Jacey has the diagnosis of obesity and his BMI today is 30.64. Dhruvan is in the action stage of change.   ADVISE: Jahki was educated on the multiple health risks of obesity as well as the benefit of weight loss to improve his health. He was advised of the need for long term treatment and the importance of lifestyle modifications to improve his current health and to decrease his risk of future health problems.  AGREE: Multiple dietary modification options and treatment options were discussed and Toma agreed to follow the recommendations documented in the above note.  ARRANGE: Harjot was educated on the importance of frequent visits to treat obesity as outlined per CMS and USPSTF guidelines and agreed to schedule his next follow up appointment today.  IKirke Corin, CMA, am acting as transcriptionist for Wilder Glade, MD I have reviewed the above documentation for  accuracy and completeness, and I agree with the above. -Quillian Quince, MD

## 2018-09-08 LAB — COMPREHENSIVE METABOLIC PANEL
ALT: 47 IU/L — ABNORMAL HIGH (ref 0–44)
AST: 25 IU/L (ref 0–40)
Albumin/Globulin Ratio: 2 (ref 1.2–2.2)
Albumin: 4.7 g/dL (ref 3.8–4.9)
Alkaline Phosphatase: 52 IU/L (ref 39–117)
BUN/Creatinine Ratio: 19 (ref 9–20)
BUN: 17 mg/dL (ref 6–24)
Bilirubin Total: 0.6 mg/dL (ref 0.0–1.2)
CO2: 23 mmol/L (ref 20–29)
Calcium: 9.3 mg/dL (ref 8.7–10.2)
Chloride: 102 mmol/L (ref 96–106)
Creatinine, Ser: 0.9 mg/dL (ref 0.76–1.27)
GFR calc Af Amer: 112 mL/min/{1.73_m2} (ref >59)
GFR calc non Af Amer: 97 mL/min/{1.73_m2} (ref >59)
Globulin, Total: 2.3 g/dL (ref 1.5–4.5)
Glucose: 88 mg/dL (ref 65–99)
Potassium: 4.6 mmol/L (ref 3.5–5.2)
Sodium: 140 mmol/L (ref 134–144)
Total Protein: 7 g/dL (ref 6.0–8.5)

## 2018-09-08 LAB — VITAMIN D 25 HYDROXY (VIT D DEFICIENCY, FRACTURES): Vit D, 25-Hydroxy: 53 ng/mL (ref 30.0–100.0)

## 2018-09-08 LAB — T3: T3, Total: 97 ng/dL (ref 71–180)

## 2018-09-08 LAB — TSH: TSH: 3.88 u[IU]/mL (ref 0.450–4.500)

## 2018-09-08 LAB — HEMOGLOBIN A1C
Est. average glucose Bld gHb Est-mCnc: 103 mg/dL
Hgb A1c MFr Bld: 5.2 % (ref 4.8–5.6)

## 2018-09-08 LAB — T4, FREE: Free T4: 1.34 ng/dL (ref 0.82–1.77)

## 2018-09-08 LAB — INSULIN, RANDOM: INSULIN: 8.9 u[IU]/mL (ref 2.6–24.9)

## 2018-10-01 ENCOUNTER — Other Ambulatory Visit (INDEPENDENT_AMBULATORY_CARE_PROVIDER_SITE_OTHER): Payer: Self-pay | Admitting: Family Medicine

## 2018-10-01 DIAGNOSIS — E038 Other specified hypothyroidism: Secondary | ICD-10-CM

## 2018-10-13 ENCOUNTER — Other Ambulatory Visit: Payer: Self-pay

## 2018-10-13 ENCOUNTER — Telehealth (INDEPENDENT_AMBULATORY_CARE_PROVIDER_SITE_OTHER): Payer: Managed Care, Other (non HMO) | Admitting: Family Medicine

## 2018-10-13 ENCOUNTER — Encounter (INDEPENDENT_AMBULATORY_CARE_PROVIDER_SITE_OTHER): Payer: Self-pay | Admitting: Family Medicine

## 2018-10-13 DIAGNOSIS — R7989 Other specified abnormal findings of blood chemistry: Secondary | ICD-10-CM

## 2018-10-13 DIAGNOSIS — E038 Other specified hypothyroidism: Secondary | ICD-10-CM | POA: Diagnosis not present

## 2018-10-13 DIAGNOSIS — E669 Obesity, unspecified: Secondary | ICD-10-CM

## 2018-10-13 DIAGNOSIS — R945 Abnormal results of liver function studies: Secondary | ICD-10-CM | POA: Diagnosis not present

## 2018-10-13 DIAGNOSIS — E559 Vitamin D deficiency, unspecified: Secondary | ICD-10-CM

## 2018-10-13 DIAGNOSIS — Z6831 Body mass index (BMI) 31.0-31.9, adult: Secondary | ICD-10-CM

## 2018-10-13 MED ORDER — LEVOTHYROXINE SODIUM 75 MCG PO TABS: 75.0000 ug | ORAL_TABLET | Freq: Every day | ORAL | 0 refills | Status: DC

## 2018-10-13 MED ORDER — VITAMIN D (ERGOCALCIFEROL) 1.25 MG (50000 UNIT) PO CAPS: 50000.0000 [IU] | ORAL_CAPSULE | ORAL | 0 refills | Status: DC

## 2018-10-13 NOTE — Progress Notes (Signed)
Office: [phone]  /  Fax: [phone] TeleHealth Visit:  [name] has verbally consented to this TeleHealth visit today. The patient is located at home, the provider is located at the [employer] [facility] office. The participants in this visit include the listed provider and patient and any and all  Parties involved. The visit was conducted today via FaceTime.  HPI:   Chief Complaint: OBESITY [name] is here to discuss his progress with his obesity treatment plan. He is on the Category 3 plan and is following his eating plan approximately 70 to 85 % of the time. He states he is exercising on the elliptical and walking 30 minutes 3 to 4 times per week. [name] is on the Category 3 plan and he feels he has done well maintaining his weight. He did well with weight loss two weeks ago, but then he went on vacation and had some celebration eating, but he is back on track. He is cutting back on liquid calories, including craft beer. We were unable to weigh the patient today for this TeleHealth visit. He feels as if he has maintained weight since his last visit (weight not reported). He has lost 27 lbs since starting treatment with [name].  Vitamin D deficiency [name] has a diagnosis of vitamin D deficiency. He is stable on vit D and denies nausea, vomiting or muscle weakness.  Hypothyroidism [name] has a diagnosis of hypothyroidism. His labs are stable. He is on levothyroxine. He denies hot or cold intolerance, palpitations or tremors.  Elevated LFTs [name] has mildly elevated ALT again. Patient has regained some of his weight since the pandemic and he admits to increasing his craft beer intake. He states he is decreasing ETOH and is back to working on his diet. He denies abdominal pain or jaundice.  ASSESSMENT AND PLAN:  Vitamin D deficiency - Plan: Vitamin D, Ergocalciferol, (DRISDOL) 1.25 MG (50000 UT) CAPS capsule  Other specified hypothyroidism - Plan: levothyroxine (SYNTHROID)  75 MCG tablet  Elevated LFTs  Class 1 obesity with serious comorbidity and body mass index (BMI) of 31.0 to 31.9 in adult, unspecified obesity type  PLAN:  Vitamin D Deficiency [name] was informed that low vitamin D levels contributes to fatigue and are associated with obesity, breast, and colon cancer. [name] agrees to continue to take prescription Vit D @50 ,000 IU every week #4 with no refills and he will follow up for routine testing of vitamin D, at least 2-3 times per year. He was informed of the risk of over-replacement of vitamin D and agrees to not increase his dose unless he discusses this with [name] first. [name] agrees to follow up with our clinic in 4 weeks.  Hypothyroidism [name] was informed of the importance of good thyroid control to help with weight loss efforts. He was also informed that supertherapeutic thyroid levels are dangerous and will not improve weight loss results. [name] agrees to continue levothyroxine 75 mcg daily #30 with no refills and follow up as directed.  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 [name] was educated on his risk of developing NASH or even liver failure and the only proven treatment for NAFLD was weight loss. [name] agreed to continue with his weight loss efforts with healthier diet and exercise as an essential part of his treatment plan. We will recheck labs in 3 months.  Obesity [name] is currently in the action stage of change. As  such, his goal is to continue with weight loss efforts He has agreed to follow the Category 3 plan [name] has been instructed to work up to a goal of 150 minutes of combined cardio and strengthening exercise per week for weight loss and overall health benefits. We discussed the following Behavioral Modification Strategies today: work on meal planning and easy cooking plans  [name] has agreed to follow up with our clinic in 4  weeks. He was informed of the importance of frequent follow up visits to maximize his success with intensive lifestyle modifications for his multiple health conditions.  ALLERGIES: No Known Allergies  MEDICATIONS: Current Outpatient Medications on File Prior to Visit  Medication Sig Dispense Refill  . HUMIRA PEN 40 MG/0.8ML PNKT Inject 40 mg into the skin See admin instructions. Twice monthly (1st & 15th)  1  . HYDROcodone-acetaminophen (NORCO) 7.5-325 MG tablet Take 1 tablet by mouth every 6 (six) hours as needed for moderate pain. 20 tablet 0  . meloxicam (MOBIC) 15 MG tablet Take 15 mg by mouth daily as needed.     . methocarbamol (ROBAXIN) 750 MG tablet TAKE 1 TABLET DAILY PRN  2  . methylphenidate (RITALIN) 10 MG tablet Take 1 tablet (10 mg total) by mouth 3 (three) times daily with meals. 90 tablet 0  . traMADol (ULTRAM) 50 MG tablet Take by mouth every 6 (six) hours as needed.     Current Facility-Administered Medications on File Prior to Visit  Medication Dose Route Frequency Provider Last Rate Last Dose  . gadopentetate dimeglumine (MAGNEVIST) injection 20 mL  20 mL Intravenous Once PRN Dohmeier, Porfirio Mylar, MD        PAST MEDICAL HISTORY: Past Medical History:  Diagnosis Date  . ADD (attention deficit disorder)   . Adult night terrors 03/29/2014  . Alcohol abuse   . Ankylosing spondylitis (HCC)    anklosing spondolitis  . Anxiety   . Asthma    EXERCISE INDUCED  RARELY  USES INHALER  . Back pain   . Depression   . Drug use   . Fatty liver   . Hypothyroidism   . IBS (irritable bowel syndrome)   . Joint pain   . Night terrors   . Sleep apnea    SLEEPS W/ MOUTH GUARD     PAST SURGICAL HISTORY: Past Surgical History:  Procedure Laterality Date  . KNEE SURGERY Left   . LUMBAR LAMINECTOMY/DECOMPRESSION MICRODISCECTOMY N/A 01/14/2017   Procedure: LUMBAR 3-4 DECOMPRESSION;  Surgeon: Estill Bamberg, MD;  Location: [facility] OR;  Service: Orthopedics;  Laterality: N/A;  LUMBAR 2-4  DECOMPRESSION / 2 HRS FOR CASE.  Marland Kitchen NASAL SEPTOPLASTY W/ TURBINOPLASTY Bilateral 12/28/2017   Procedure: NASAL SEPTOPLASTY WITH TURBINATE REDUCTION;  Surgeon: Serena Colonel, MD;  Location: [facility];  Service: ENT;  Laterality: Bilateral;  . TONSILLECTOMY AND ADENOIDECTOMY  1992  . WISDOM TOOTH EXTRACTION      SOCIAL HISTORY: Social History   Tobacco Use  . Smoking status: Former Smoker    Years: 4.00    Types: Cigarettes    Quit date: 01/16/1986    Years since quitting: 32.7  . Smokeless tobacco: Never Used  Substance Use Topics  . Alcohol use: Yes    Alcohol/week: 0.0 standard drinks    Comment: Two drinks daily  . Drug use: Yes    Types: Marijuana    Comment: last used 12/27/17    FAMILY HISTORY: Family History  Problem Relation Age of Onset  . Aneurysm Mother   .  Sudden death Mother   . Stroke Father   . Hypertension Father   . Thyroid disease Father   . Alcoholism Father   . Aneurysm Other        brain    ROS: Review of Systems  Constitutional: Negative for weight loss.  Cardiovascular: Negative for palpitations.  Gastrointestinal: Negative for abdominal pain, nausea and vomiting.  Musculoskeletal:       Negative for muscle weakness  Skin:       Negative for jaundice  Neurological: Negative for tremors.  Endo/Heme/Allergies:       Negative for hot or cold intolerance    PHYSICAL EXAM: Pt in no acute distress  RECENT LABS AND TESTS: BMET    Component Value Date/Time   NA 140 09/07/2018 1221   K 4.6 09/07/2018 1221   CL 102 09/07/2018 1221   CO2 23 09/07/2018 1221   GLUCOSE 88 09/07/2018 1221   GLUCOSE 124 (H) 01/09/2017 1231   BUN 17 09/07/2018 1221   CREATININE 0.90 09/07/2018 1221   CALCIUM 9.3 09/07/2018 1221   GFRNONAA 97 09/07/2018 1221   GFRAA 112 09/07/2018 1221   Lab Results  Component Value Date   HGBA1C 5.2 09/07/2018   HGBA1C 5.1 03/15/2018   HGBA1C 5.2 11/02/2017   HGBA1C 5.1 06/22/2017   HGBA1C 5.3 02/16/2017    Lab Results  Component Value Date   INSULIN 8.9 09/07/2018   INSULIN 6.1 03/15/2018   INSULIN 8.8 11/02/2017   INSULIN 8.0 06/22/2017   INSULIN 10.0 02/16/2017   CBC    Component Value Date/Time   WBC 4.7 06/22/2017 1048   WBC 5.5 01/09/2017 1231   RBC 5.55 06/22/2017 1048   RBC 5.50 01/09/2017 1231   HGB 15.5 06/22/2017 1048   HCT 46.5 06/22/2017 1048   PLT 173 01/09/2017 1231   MCV 84 06/22/2017 1048   MCH 27.9 06/22/2017 1048   MCH 27.6 01/09/2017 1231   MCHC 33.3 06/22/2017 1048   MCHC 33.1 01/09/2017 1231   RDW 14.5 06/22/2017 1048   LYMPHSABS 1.6 06/22/2017 1048   MONOABS 0.4 01/09/2017 1231   EOSABS 0.1 06/22/2017 1048   BASOSABS 0.0 06/22/2017 1048   Iron/TIBC/Ferritin/ %Sat No results found for: IRON, TIBC, FERRITIN, IRONPCTSAT Lipid Panel     Component Value Date/Time   CHOL 123 03/15/2018 1142   TRIG 85 03/15/2018 1142   HDL 36 (L) 03/15/2018 1142   LDLCALC 70 03/15/2018 1142   Hepatic Function Panel     Component Value Date/Time   PROT 7.0 09/07/2018 1221   ALBUMIN 4.7 09/07/2018 1221   AST 25 09/07/2018 1221   ALT 47 (H) 09/07/2018 1221   ALKPHOS 52 09/07/2018 1221   BILITOT 0.6 09/07/2018 1221      Component Value Date/Time   TSH 3.880 09/07/2018 1221   TSH 4.000 06/22/2017 1048   TSH 3.430 02/16/2017 1142     Ref. Range 09/07/2018 12:21  Vitamin D, 25-Hydroxy Latest Ref Range: 30.0 - 100.0 ng/mL 53.0    I, [place] CraneJoanne Murray, am acting as Energy managertranscriptionist for Quillian Quincearen Maytte Jacot, MD I have reviewed the above documentation for accuracy and completeness, and I agree with the above. -Quillian Quincearen Chanel Mckesson, MD

## 2018-10-15 ENCOUNTER — Encounter (HOSPITAL_COMMUNITY): Payer: Self-pay | Admitting: Psychiatry

## 2018-10-15 ENCOUNTER — Ambulatory Visit (INDEPENDENT_AMBULATORY_CARE_PROVIDER_SITE_OTHER): Payer: 59 | Admitting: Psychiatry

## 2018-10-15 ENCOUNTER — Other Ambulatory Visit: Payer: Self-pay

## 2018-10-15 DIAGNOSIS — F418 Other specified anxiety disorders: Secondary | ICD-10-CM

## 2018-10-15 DIAGNOSIS — F514 Sleep terrors [night terrors]: Secondary | ICD-10-CM | POA: Diagnosis not present

## 2018-10-15 MED ORDER — DULOXETINE HCL 20 MG PO CPEP: ORAL_CAPSULE | ORAL | 0 refills | Status: DC
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
## 2018-10-29 ENCOUNTER — Encounter (HOSPITAL_COMMUNITY): Payer: Self-pay | Admitting: Psychiatry

## 2018-10-29 ENCOUNTER — Other Ambulatory Visit: Payer: Self-pay

## 2018-10-29 ENCOUNTER — Ambulatory Visit (INDEPENDENT_AMBULATORY_CARE_PROVIDER_SITE_OTHER): Payer: 59 | Admitting: Psychiatry

## 2018-10-29 DIAGNOSIS — F514 Sleep terrors [night terrors]: Secondary | ICD-10-CM | POA: Diagnosis not present

## 2018-10-29 DIAGNOSIS — F418 Other specified anxiety disorders: Secondary | ICD-10-CM | POA: Diagnosis not present

## 2018-10-29 MED ORDER — DULOXETINE HCL 40 MG PO CPEP: 40.0000 mg | ORAL_CAPSULE | Freq: Every day | ORAL | 1 refills | Status: DC

## 2018-10-29 NOTE — Progress Notes (Signed)
Virtual Visit via Telephone Note  I connected with [name] on 10/29/18 at 10:00 AM EDT by telephone and verified that I am speaking with the correct person using two identifiers.   I discussed the limitations, risks, security and privacy concerns of performing an evaluation and management service by telephone and the availability of in person appointments. I also discussed with the patient that there may be a patient responsible charge related to this service. The patient expressed understanding and agreed to proceed.   History of Present Illness: Patient was evaluated by phone session.  He is a 53 year old Caucasian employed married man who was seen first time 3 weeks ago.  He was referred from neurology for the management of anxiety and night terrors.  We started him on Cymbalta 20 mg twice a day.  He admitted since started Cymbalta he is feeling better.  He is not as anxious and he reported his mood is lifted and he does not think as much about his job and other worries.  He also mentioned that his night terror are not as bad but he also noticed decrease in sexual desire.  However he wants to continue current medicine since he had noticed a lot of improvement in his mood.  He is sleeping good.  He reported his appetite is unchanged.  He is working and he reported his work is much better because he is able to pay more attention to the job.  He denies any feeling of hopelessness or worthlessness.  He denies any crying spells or any paranoia.  He is tolerating Cymbalta and reported no tremors, shakes or any EPS.  He is taking Ritalin 10 mg twice a day prescribed by neurology to help his attention and focus.  Though he never had formal psychological testing but his neurologist screen him for ADD.  Patient lives with his wife and 3 children.  Patient admitted using marijuana which he believes helped his joint pain.     Past Psychiatric History: History of anxiety, depression and night terrors for  more than 20 years.  No h/o inpatient treatment, suicidal attempt, traumatic experience, psychosis and mania. Tried imipramine, Seroquel, melatonin, Wellbutrin and Celexa by his neurologist.  Prescribed Ritalin by neurology after the screening test for resumption of ADD.    Psychiatric Specialty Exam: Physical Exam  ROS  There were no vitals taken for this visit.There is no height or weight on file to calculate BMI.  General Appearance: NA  Eye Contact:  NA  Speech:  Clear and Coherent  Volume:  Normal  Mood:  Anxious  Affect:  NA  Thought Process:  Goal Directed  Orientation:  Full (Time, Place, and Person)  Thought Content:  Logical  Suicidal Thoughts:  No  Homicidal Thoughts:  No  Memory:  Immediate;   Good Recent;   Good Remote;   Good  Judgement:  Good  Insight:  Present  Psychomotor Activity:  NA  Concentration:  Concentration: Fair and Attention Span: Fair  Recall:  Good  Fund of Knowledge:  Good  Language:  Good  Akathisia:  No  Handed:  Right  AIMS (if indicated):     Assets:  Communication Skills Desire for Improvement Housing Resilience Social Support Talents/Skills Transportation  ADL's:  Intact  Cognition:  WNL  Sleep:   improved      Assessment and Plan: Night terrors.  Anxiety with depression.  Patient doing better since we started Cymbalta.  So far he is tolerating well except decrease  in sexual desire.  He wants to continue current medication since he noticed improvement in his mood anxiety and night terrors.  He is still waiting to get schedule for a therapist.  We will provide names of the therapist.  I will continue Cymbalta 40 mg daily.  Recommended to call us back if is any question or any concern.  Follow-up in 2 months.  Follow Up Instructions:    I discussed the assessment and treatment plan with the patient. The patient was provided an opportunity to ask questions and all were answered. The patient agreed with the plan and demonstrated an  understanding of the instructions.   The patient was advised to call back or seek an in-person evaluation if the symptoms worsen or if the condition fails to improve as anticipated.  I provided 20 minutes of non-face-to-face time during this encounter.   Kathlee Nations, MD

## 2018-11-04 ENCOUNTER — Other Ambulatory Visit (INDEPENDENT_AMBULATORY_CARE_PROVIDER_SITE_OTHER): Payer: Self-pay | Admitting: Family Medicine

## 2018-11-04 DIAGNOSIS — E038 Other specified hypothyroidism: Secondary | ICD-10-CM

## 2018-11-04 DIAGNOSIS — E559 Vitamin D deficiency, unspecified: Secondary | ICD-10-CM

## 2018-11-10 ENCOUNTER — Other Ambulatory Visit: Payer: Self-pay

## 2018-11-10 ENCOUNTER — Encounter (INDEPENDENT_AMBULATORY_CARE_PROVIDER_SITE_OTHER): Payer: Self-pay | Admitting: Family Medicine

## 2018-11-10 ENCOUNTER — Telehealth (INDEPENDENT_AMBULATORY_CARE_PROVIDER_SITE_OTHER): Payer: Managed Care, Other (non HMO) | Admitting: Family Medicine

## 2018-11-15 ENCOUNTER — Other Ambulatory Visit: Payer: Self-pay | Admitting: Family Medicine

## 2018-11-15 ENCOUNTER — Telehealth (HOSPITAL_COMMUNITY): Payer: Self-pay

## 2018-11-15 DIAGNOSIS — F418 Other specified anxiety disorders: Secondary | ICD-10-CM

## 2018-11-15 DIAGNOSIS — Z9989 Dependence on other enabling machines and devices: Secondary | ICD-10-CM

## 2018-11-15 DIAGNOSIS — F514 Sleep terrors [night terrors]: Secondary | ICD-10-CM

## 2018-11-15 DIAGNOSIS — G4733 Obstructive sleep apnea (adult) (pediatric): Secondary | ICD-10-CM

## 2018-11-15 DIAGNOSIS — G4710 Hypersomnia, unspecified: Secondary | ICD-10-CM

## 2018-11-15 DIAGNOSIS — F901 Attention-deficit hyperactivity disorder, predominantly hyperactive type: Secondary | ICD-10-CM

## 2018-11-15 NOTE — Telephone Encounter (Signed)
Patient is calling to see if you can increase the Cymbalta up to 60 mg, please review and advise, thank you

## 2018-11-15 NOTE — Telephone Encounter (Signed)
He has.  He can take Cymbalta 20 mg 3 capsules daily or 60 mg once a day.

## 2018-11-15 NOTE — Telephone Encounter (Signed)
Drug registry checked Ritalin 10mg  po TID, last fill 09-06-18 #90.

## 2018-11-15 NOTE — Telephone Encounter (Signed)
Pt is requesting a refill of methylphenidate (RITALIN) 10 MG tablet  , to be sent to [id number] - [place], [place] - [address] AT [address]

## 2018-11-16 MED ORDER — METHYLPHENIDATE HCL 10 MG PO TABS: 10.0000 mg | ORAL_TABLET | Freq: Three times a day (TID) | ORAL | 0 refills | Status: DC

## 2018-11-17 ENCOUNTER — Other Ambulatory Visit (HOSPITAL_COMMUNITY): Payer: Self-pay | Admitting: Psychiatry

## 2018-11-17 DIAGNOSIS — F514 Sleep terrors [night terrors]: Secondary | ICD-10-CM

## 2018-11-17 DIAGNOSIS — F418 Other specified anxiety disorders: Secondary | ICD-10-CM

## 2018-11-19 MED ORDER — DULOXETINE HCL 60 MG PO CPEP: 60.0000 mg | ORAL_CAPSULE | Freq: Every day | ORAL | 0 refills | Status: DC

## 2018-11-19 NOTE — Telephone Encounter (Signed)
Patients 60 mg Cymbalta sent to the pharmacy, called the patient and let him know

## 2018-11-29 ENCOUNTER — Other Ambulatory Visit: Payer: Self-pay

## 2018-11-29 ENCOUNTER — Encounter (INDEPENDENT_AMBULATORY_CARE_PROVIDER_SITE_OTHER): Payer: Self-pay | Admitting: Family Medicine

## 2018-11-29 ENCOUNTER — Ambulatory Visit (INDEPENDENT_AMBULATORY_CARE_PROVIDER_SITE_OTHER): Payer: Managed Care, Other (non HMO) | Admitting: Family Medicine

## 2018-11-29 VITALS — BP 143/82 | HR 90 | Temp 98.5°F | Ht 72.0 in | Wt 223.0 lb

## 2018-11-29 DIAGNOSIS — Z6830 Body mass index (BMI) 30.0-30.9, adult: Secondary | ICD-10-CM

## 2018-11-29 DIAGNOSIS — E559 Vitamin D deficiency, unspecified: Secondary | ICD-10-CM | POA: Diagnosis not present

## 2018-11-29 DIAGNOSIS — E669 Obesity, unspecified: Secondary | ICD-10-CM

## 2018-11-29 DIAGNOSIS — Z9189 Other specified personal risk factors, not elsewhere classified: Secondary | ICD-10-CM

## 2018-11-29 DIAGNOSIS — E66811 Obesity, class 1: Secondary | ICD-10-CM

## 2018-11-29 DIAGNOSIS — E038 Other specified hypothyroidism: Secondary | ICD-10-CM | POA: Diagnosis not present

## 2018-11-29 MED ORDER — VITAMIN D (ERGOCALCIFEROL) 1.25 MG (50000 UNIT) PO CAPS: 50000.0000 [IU] | ORAL_CAPSULE | ORAL | 0 refills | Status: DC

## 2018-11-29 MED ORDER — LEVOTHYROXINE SODIUM 75 MCG PO TABS: 75.0000 ug | ORAL_TABLET | Freq: Every day | ORAL | 0 refills | Status: DC

## 2018-12-01 ENCOUNTER — Other Ambulatory Visit: Payer: Self-pay

## 2018-12-01 DIAGNOSIS — Z20822 Contact with and (suspected) exposure to covid-19: Secondary | ICD-10-CM

## 2018-12-01 NOTE — Progress Notes (Signed)
Office: [phone]  /  Fax: [phone]   HPI:   Chief Complaint: OBESITY [name] is here to discuss his progress with his obesity treatment plan. He is on the Category 3 plan and is following his eating plan approximately 80-85 % of the time. He states he is on the elliptical for 20 minutes 3 times per week. [name] continues to do well with weight loss on his plan. His hunger is controlled and he has decreased emotional eating. He will not be eating a large Thanksgiving meal and is not concerned about holiday eating overall this year.  His weight is 223 lb (101.2 kg) today and has had a weight loss of 3 pounds over a period of 3 months since his last visit. He has lost 30 lbs since starting treatment with us.  Vitamin D Deficiency [name] has a diagnosis of vitamin D deficiency. He is stable on prescription Vit D and denies nausea, vomiting or muscle weakness.  Hypothyroidism [name] has a diagnosis of hypothyroidism. His last TSH was within normal limits. He is currently on levothyroxine. He notes fatigue has improved and denies hot or cold intolerance or palpitations.  At risk for cardiovascular disease [name] is at a higher than average risk for cardiovascular disease due to obesity and hypothyroidism. He currently denies any chest pain.  ASSESSMENT AND PLAN:  Vitamin D deficiency - Plan: Vitamin D, Ergocalciferol, (DRISDOL) 1.25 MG (50000 UT) CAPS capsule  Other specified hypothyroidism - Plan: levothyroxine (SYNTHROID) 75 MCG tablet  At risk for heart disease  Class 1 obesity with serious comorbidity and body mass index (BMI) of 30.0 to 30.9 in adult, unspecified obesity type  PLAN:  Vitamin D Deficiency [name] was informed that low vitamin D levels contributes to fatigue and are associated with obesity, breast, and colon cancer. [name] agrees to continue taking prescription Vit D 50,000 IU every week #4 and we will refill for 1 month. He will follow up for routine testing  of vitamin D, at least 2-3 times per year. He was informed of the risk of over-replacement of vitamin D and agrees to not increase his dose unless he discusses this with us first. We will recheck labs at his next visit. [name] agrees to follow up with our clinic in 4 weeks.  Hypothyroidism [name] was informed of the importance of good thyroid control to help with weight loss efforts. He was also informed that supertheraputic thyroid levels are dangerous and will not improve weight loss results. [name] agrees to continue taking levothyroxine 75 mcg PO q AM #30 and we will refill for 1 month. We will recheck labs at his next visit. [name] agrees to follow up with our clinic in 4 weeks  Cardiovascular risk counseling [name] was given extended (15 minutes) coronary artery disease prevention counseling today. He is 53 y.o. male and has risk factors for heart disease including obesity and hypothyroidism. We discussed intensive lifestyle modifications today with an emphasis on specific weight loss instructions and strategies. Pt was also informed of the importance of increasing exercise and decreasing saturated fats to help prevent heart disease.  Obesity [name] is currently in the action stage of change. As such, his goal is to continue with weight loss efforts He has agreed to follow the Category 3 plan [name] has been instructed to work up to a goal of 150 minutes of combined cardio and strengthening exercise per week for weight loss and overall health benefits. We discussed the following Behavioral Modification Strategies today: increasing lean  protein intake and holiday eating strategies    [name] has agreed to follow up with our clinic in 4 weeks. He was informed of the importance of frequent follow up visits to maximize his success with intensive lifestyle modifications for his multiple health conditions.  ALLERGIES: No Known Allergies  MEDICATIONS: Current Outpatient Medications on File Prior  to Visit  Medication Sig Dispense Refill  . DULoxetine (CYMBALTA) 60 MG capsule Take 1 capsule (60 mg total) by mouth daily. 30 capsule 0  . HUMIRA PEN 40 MG/0.8ML PNKT Inject 40 mg into the skin See admin instructions. Twice monthly (1st & 15th)  1  . HYDROcodone-acetaminophen (NORCO) 7.5-325 MG tablet Take 1 tablet by mouth every 6 (six) hours as needed for moderate pain. 20 tablet 0  . meloxicam (MOBIC) 15 MG tablet Take 15 mg by mouth daily as needed.     . methocarbamol (ROBAXIN) 750 MG tablet TAKE 1 TABLET DAILY PRN  2  . methylphenidate (RITALIN) 10 MG tablet Take 1 tablet (10 mg total) by mouth 3 (three) times daily with meals. 90 tablet 0  . traMADol (ULTRAM) 50 MG tablet Take by mouth every 6 (six) hours as needed.     Current Facility-Administered Medications on File Prior to Visit  Medication Dose Route Frequency Provider Last Rate Last Dose  . gadopentetate dimeglumine (MAGNEVIST) injection 20 mL  20 mL Intravenous Once PRN Dohmeier, Porfirio Mylar, MD        PAST MEDICAL HISTORY: Past Medical History:  Diagnosis Date  . ADD (attention deficit disorder)   . Adult night terrors 03/29/2014  . Alcohol abuse   . Ankylosing spondylitis (HCC)    anklosing spondolitis  . Anxiety   . Asthma    EXERCISE INDUCED  RARELY  USES INHALER  . Back pain   . Depression   . Drug use   . Fatty liver   . Hypothyroidism   . IBS (irritable bowel syndrome)   . Joint pain   . Night terrors   . Sleep apnea    SLEEPS W/ MOUTH GUARD     PAST SURGICAL HISTORY: Past Surgical History:  Procedure Laterality Date  . KNEE SURGERY Left   . LUMBAR LAMINECTOMY/DECOMPRESSION MICRODISCECTOMY N/A 01/14/2017   Procedure: LUMBAR 3-4 DECOMPRESSION;  Surgeon: Estill Bamberg, MD;  Location: [facility] OR;  Service: Orthopedics;  Laterality: N/A;  LUMBAR 2-4 DECOMPRESSION / 2 HRS FOR CASE.  Marland Kitchen NASAL SEPTOPLASTY W/ TURBINOPLASTY Bilateral 12/28/2017   Procedure: NASAL SEPTOPLASTY WITH TURBINATE REDUCTION;  Surgeon: Serena Colonel, MD;  Location: [facility];  Service: ENT;  Laterality: Bilateral;  . TONSILLECTOMY AND ADENOIDECTOMY  1992  . WISDOM TOOTH EXTRACTION      SOCIAL HISTORY: Social History   Tobacco Use  . Smoking status: Former Smoker    Years: 4.00    Types: Cigarettes    Quit date: 01/16/1986    Years since quitting: 32.8  . Smokeless tobacco: Never Used  Substance Use Topics  . Alcohol use: Yes    Alcohol/week: 0.0 standard drinks    Comment: Two drinks daily  . Drug use: Yes    Types: Marijuana    Comment: last used 12/27/17    FAMILY HISTORY: Family History  Problem Relation Age of Onset  . Aneurysm Mother   . Sudden death Mother   . Stroke Father   . Hypertension Father   . Thyroid disease Father   . Alcoholism Father   . Aneurysm Other  brain    ROS: Review of Systems  Constitutional: Positive for malaise/fatigue and weight loss.  Cardiovascular: Negative for chest pain and palpitations.  Gastrointestinal: Negative for nausea and vomiting.  Musculoskeletal:       Negative muscle weakness  Endo/Heme/Allergies:       Negative hot/cold intolerance    PHYSICAL EXAM: Blood pressure (!) 143/82, pulse 90, temperature 98.5 F (36.9 C), temperature source Oral, height 6' (1.829 m), weight 223 lb (101.2 kg), SpO2 97 %. Body mass index is 30.24 kg/m. Physical Exam Vitals signs reviewed.  Constitutional:      Appearance: Normal appearance. He is obese.  Cardiovascular:     Rate and Rhythm: Normal rate.     Pulses: Normal pulses.  Pulmonary:     Effort: Pulmonary effort is normal.     Breath sounds: Normal breath sounds.  Musculoskeletal: Normal range of motion.  Skin:    General: Skin is warm and dry.  Neurological:     Mental Status: He is alert and oriented to person, place, and time.  Psychiatric:        Mood and Affect: Mood normal.        Behavior: Behavior normal.     RECENT LABS AND TESTS: BMET    Component Value Date/Time   NA  140 09/07/2018 1221   K 4.6 09/07/2018 1221   CL 102 09/07/2018 1221   CO2 23 09/07/2018 1221   GLUCOSE 88 09/07/2018 1221   GLUCOSE 124 (H) 01/09/2017 1231   BUN 17 09/07/2018 1221   CREATININE 0.90 09/07/2018 1221   CALCIUM 9.3 09/07/2018 1221   GFRNONAA 97 09/07/2018 1221   GFRAA 112 09/07/2018 1221   Lab Results  Component Value Date   HGBA1C 5.2 09/07/2018   HGBA1C 5.1 03/15/2018   HGBA1C 5.2 11/02/2017   HGBA1C 5.1 06/22/2017   HGBA1C 5.3 02/16/2017   Lab Results  Component Value Date   INSULIN 8.9 09/07/2018   INSULIN 6.1 03/15/2018   INSULIN 8.8 11/02/2017   INSULIN 8.0 06/22/2017   INSULIN 10.0 02/16/2017   CBC    Component Value Date/Time   WBC 4.7 06/22/2017 1048   WBC 5.5 01/09/2017 1231   RBC 5.55 06/22/2017 1048   RBC 5.50 01/09/2017 1231   HGB 15.5 06/22/2017 1048   HCT 46.5 06/22/2017 1048   PLT 173 01/09/2017 1231   MCV 84 06/22/2017 1048   MCH 27.9 06/22/2017 1048   MCH 27.6 01/09/2017 1231   MCHC 33.3 06/22/2017 1048   MCHC 33.1 01/09/2017 1231   RDW 14.5 06/22/2017 1048   LYMPHSABS 1.6 06/22/2017 1048   MONOABS 0.4 01/09/2017 1231   EOSABS 0.1 06/22/2017 1048   BASOSABS 0.0 06/22/2017 1048   Iron/TIBC/Ferritin/ %Sat No results found for: IRON, TIBC, FERRITIN, IRONPCTSAT Lipid Panel     Component Value Date/Time   CHOL 123 03/15/2018 1142   TRIG 85 03/15/2018 1142   HDL 36 (L) 03/15/2018 1142   LDLCALC 70 03/15/2018 1142   Hepatic Function Panel     Component Value Date/Time   PROT 7.0 09/07/2018 1221   ALBUMIN 4.7 09/07/2018 1221   AST 25 09/07/2018 1221   ALT 47 (H) 09/07/2018 1221   ALKPHOS 52 09/07/2018 1221   BILITOT 0.6 09/07/2018 1221      Component Value Date/Time   TSH 3.880 09/07/2018 1221   TSH 4.000 06/22/2017 1048   TSH 3.430 02/16/2017 1142      OBESITY BEHAVIORAL INTERVENTION VISIT  Today's visit was #  25   Starting weight: 253 lbs Starting date: 02/16/17 Today's weight : 223 lbs Today's date:  11/29/2018 Total lbs lost to date: 30    ASK: We discussed the diagnosis of obesity with [name] today and [name] agreed to give [name] permission to discuss obesity behavioral modification therapy today.  ASSESS: [name] has the diagnosis of obesity and his BMI today is 30.24 [name] is in the action stage of change   ADVISE: [name] was educated on the multiple health risks of obesity as well as the benefit of weight loss to improve his health. He was advised of the need for long term treatment and the importance of lifestyle modifications to improve his current health and to decrease his risk of future health problems.  AGREE: Multiple dietary modification options and treatment options were discussed and  [name] agreed to follow the recommendations documented in the above note.  ARRANGE: [name] was educated on the importance of frequent visits to treat obesity as outlined per CMS and USPSTF guidelines and agreed to schedule his next follow up appointment today.  I, Burt Knack, am acting as transcriptionist for Quillian Quince, MD I have reviewed the above documentation for accuracy and completeness, and I agree with the above. -Quillian Quince, MD

## 2018-12-03 LAB — NOVEL CORONAVIRUS, NAA: SARS-CoV-2, NAA: DETECTED — AB

## 2018-12-13 ENCOUNTER — Telehealth (HOSPITAL_COMMUNITY): Payer: Self-pay

## 2018-12-13 NOTE — Telephone Encounter (Signed)
Medication refill request - Fax refill request for pt's prescribed Duloxetine, last provided a 30 day dosage on 11/19/18 but pt does not return until 12/27/18.

## 2018-12-14 MED ORDER — DULOXETINE HCL 60 MG PO CPEP: 60.0000 mg | ORAL_CAPSULE | Freq: Every day | ORAL | 0 refills | Status: DC

## 2018-12-14 NOTE — Telephone Encounter (Signed)
Send to [facility].

## 2018-12-21 ENCOUNTER — Other Ambulatory Visit (INDEPENDENT_AMBULATORY_CARE_PROVIDER_SITE_OTHER): Payer: Self-pay

## 2018-12-21 ENCOUNTER — Telehealth (INDEPENDENT_AMBULATORY_CARE_PROVIDER_SITE_OTHER): Payer: Self-pay | Admitting: Family Medicine

## 2018-12-21 DIAGNOSIS — E038 Other specified hypothyroidism: Secondary | ICD-10-CM

## 2018-12-21 MED ORDER — LEVOTHYROXINE SODIUM 75 MCG PO TABS: 75.0000 ug | ORAL_TABLET | Freq: Every day | ORAL | 0 refills | Status: DC

## 2018-12-21 NOTE — Telephone Encounter (Signed)
Refill sent to [facility].

## 2018-12-27 ENCOUNTER — Ambulatory Visit (HOSPITAL_COMMUNITY): Payer: 59 | Admitting: Psychiatry

## 2018-12-27 ENCOUNTER — Ambulatory Visit (INDEPENDENT_AMBULATORY_CARE_PROVIDER_SITE_OTHER): Payer: Managed Care, Other (non HMO) | Admitting: Family Medicine

## 2018-12-28 ENCOUNTER — Other Ambulatory Visit (INDEPENDENT_AMBULATORY_CARE_PROVIDER_SITE_OTHER): Payer: Self-pay | Admitting: Family Medicine

## 2018-12-28 DIAGNOSIS — E559 Vitamin D deficiency, unspecified: Secondary | ICD-10-CM

## 2018-12-29 ENCOUNTER — Ambulatory Visit (HOSPITAL_COMMUNITY): Payer: 59 | Admitting: Psychiatry

## 2019-01-05 ENCOUNTER — Telehealth (HOSPITAL_COMMUNITY): Payer: Self-pay

## 2019-01-05 NOTE — Telephone Encounter (Signed)
Medication refill request - [facility] for a [facility] day refill of his prescribed Duloxetine.  Patient was cancelled 12/29/18 due to provider out.  Needs a new appointment.

## 2019-01-10 ENCOUNTER — Telehealth (HOSPITAL_COMMUNITY): Payer: Self-pay | Admitting: Psychiatry

## 2019-01-10 MED ORDER — DULOXETINE HCL 60 MG PO CPEP: 60.0000 mg | ORAL_CAPSULE | Freq: Every day | ORAL | 0 refills | Status: DC

## 2019-01-10 NOTE — Telephone Encounter (Signed)
He needs appointment for future refills. I will provide 30 days supply of duloxetine.

## 2019-01-17 ENCOUNTER — Other Ambulatory Visit (INDEPENDENT_AMBULATORY_CARE_PROVIDER_SITE_OTHER): Payer: Self-pay | Admitting: Family Medicine

## 2019-01-17 DIAGNOSIS — E038 Other specified hypothyroidism: Secondary | ICD-10-CM

## 2019-01-20 ENCOUNTER — Other Ambulatory Visit (INDEPENDENT_AMBULATORY_CARE_PROVIDER_SITE_OTHER): Payer: Self-pay | Admitting: Family Medicine

## 2019-01-20 DIAGNOSIS — E559 Vitamin D deficiency, unspecified: Secondary | ICD-10-CM

## 2019-01-24 ENCOUNTER — Other Ambulatory Visit (INDEPENDENT_AMBULATORY_CARE_PROVIDER_SITE_OTHER): Payer: Self-pay | Admitting: Family Medicine

## 2019-01-24 ENCOUNTER — Encounter (INDEPENDENT_AMBULATORY_CARE_PROVIDER_SITE_OTHER): Payer: Self-pay | Admitting: Family Medicine

## 2019-01-24 DIAGNOSIS — E559 Vitamin D deficiency, unspecified: Secondary | ICD-10-CM

## 2019-01-27 ENCOUNTER — Encounter (INDEPENDENT_AMBULATORY_CARE_PROVIDER_SITE_OTHER): Payer: Self-pay | Admitting: Family Medicine

## 2019-01-27 ENCOUNTER — Ambulatory Visit (INDEPENDENT_AMBULATORY_CARE_PROVIDER_SITE_OTHER): Payer: Managed Care, Other (non HMO) | Admitting: Family Medicine

## 2019-01-27 ENCOUNTER — Other Ambulatory Visit: Payer: Self-pay

## 2019-01-27 VITALS — BP 131/77 | HR 61 | Temp 98.1°F | Ht 72.0 in | Wt 225.0 lb

## 2019-01-27 DIAGNOSIS — E038 Other specified hypothyroidism: Secondary | ICD-10-CM

## 2019-01-27 DIAGNOSIS — Z6830 Body mass index (BMI) 30.0-30.9, adult: Secondary | ICD-10-CM

## 2019-01-27 DIAGNOSIS — M545 Low back pain, unspecified: Secondary | ICD-10-CM

## 2019-01-27 DIAGNOSIS — E66811 Obesity, class 1: Secondary | ICD-10-CM

## 2019-01-27 DIAGNOSIS — E559 Vitamin D deficiency, unspecified: Secondary | ICD-10-CM | POA: Diagnosis not present

## 2019-01-27 DIAGNOSIS — G8929 Other chronic pain: Secondary | ICD-10-CM

## 2019-01-27 DIAGNOSIS — Z9189 Other specified personal risk factors, not elsewhere classified: Secondary | ICD-10-CM | POA: Diagnosis not present

## 2019-01-27 DIAGNOSIS — E669 Obesity, unspecified: Secondary | ICD-10-CM

## 2019-01-27 MED ORDER — VITAMIN D (ERGOCALCIFEROL) 1.25 MG (50000 UNIT) PO CAPS: 50000.0000 [IU] | ORAL_CAPSULE | ORAL | 0 refills | Status: DC

## 2019-01-28 LAB — INSULIN, RANDOM: INSULIN: 8.2 u[IU]/mL (ref 2.6–24.9)

## 2019-01-28 LAB — HEMOGLOBIN A1C
Est. average glucose Bld gHb Est-mCnc: 103 mg/dL
Hgb A1c MFr Bld: 5.2 % (ref 4.8–5.6)

## 2019-01-28 LAB — LIPID PANEL WITH LDL/HDL RATIO
Cholesterol, Total: 166 mg/dL (ref 100–199)
HDL: 43 mg/dL (ref >39)
LDL Chol Calc (NIH): 103 mg/dL — ABNORMAL HIGH (ref 0–99)
LDL/HDL Ratio: 2.4 ratio (ref 0.0–3.6)
Triglycerides: 109 mg/dL (ref 0–149)
VLDL Cholesterol Cal: 20 mg/dL (ref 5–40)

## 2019-01-28 LAB — COMPREHENSIVE METABOLIC PANEL
ALT: 46 IU/L — ABNORMAL HIGH (ref 0–44)
AST: 22 IU/L (ref 0–40)
Albumin/Globulin Ratio: 2 (ref 1.2–2.2)
Albumin: 4.9 g/dL (ref 3.8–4.9)
Alkaline Phosphatase: 51 IU/L (ref 39–117)
BUN/Creatinine Ratio: 24 — ABNORMAL HIGH (ref 9–20)
BUN: 20 mg/dL (ref 6–24)
Bilirubin Total: 0.8 mg/dL (ref 0.0–1.2)
CO2: 24 mmol/L (ref 20–29)
Calcium: 9.3 mg/dL (ref 8.7–10.2)
Chloride: 103 mmol/L (ref 96–106)
Creatinine, Ser: 0.83 mg/dL (ref 0.76–1.27)
GFR calc Af Amer: 116 mL/min/{1.73_m2} (ref >59)
GFR calc non Af Amer: 100 mL/min/{1.73_m2} (ref >59)
Globulin, Total: 2.4 g/dL (ref 1.5–4.5)
Glucose: 96 mg/dL (ref 65–99)
Potassium: 4.5 mmol/L (ref 3.5–5.2)
Sodium: 142 mmol/L (ref 134–144)
Total Protein: 7.3 g/dL (ref 6.0–8.5)

## 2019-01-28 LAB — TSH: TSH: 3.49 u[IU]/mL (ref 0.450–4.500)

## 2019-01-28 LAB — T4, FREE: Free T4: 1.64 ng/dL (ref 0.82–1.77)

## 2019-01-28 LAB — T3: T3, Total: 96 ng/dL (ref 71–180)

## 2019-01-28 LAB — VITAMIN D 25 HYDROXY (VIT D DEFICIENCY, FRACTURES): Vit D, 25-Hydroxy: 44.2 ng/mL (ref 30.0–100.0)

## 2019-01-29 ENCOUNTER — Encounter (INDEPENDENT_AMBULATORY_CARE_PROVIDER_SITE_OTHER): Payer: Self-pay | Admitting: Family Medicine

## 2019-01-29 NOTE — Progress Notes (Signed)
Chief Complaint:   OBESITY [name] is here to discuss his progress with his obesity treatment plan along with follow-up of his obesity related diagnoses. [name] is on the Category 3 Plan and states he is following his eating plan approximately 70-75% of the time. [name] states he is doing cardio 20-30 minutes 3 times per week.  Today's visit was #: 26 Starting weight: 02/16/2017 Starting date: 253 lbs Today's weight: 225 lbs Today's date:01/27/2019 Total lbs lost to date: 28 lbs Total lbs lost since last in-office visit: 0  Interim History: [name] was sick with COVID-19 in Nov and is feeling better. He did some holiday eating in Dec buthas already gotten back on track with his Category 3 plan. He has been exercising but his back pain has been limiting him.   Subjective:   1. Vitamin D deficiency [name]'s Vitamin D level was 44.2 on 01/27/2019. He is currently taking vit D. He denies nausea, vomiting or muscle weakness. He is due to have labs.   2. Other specified hypothyroidism He is on synthroid. He denies palpitation and reports some fatigue especially after COVID-19 infection.   Lab Results  Component Value Date   TSH 3.490 01/27/2019    3. Chronic low back pain, unspecified back pain laterality, unspecified whether sciatica present He notes his back pain has worsened in the last month. He has a history of spinal stenosis and would like to receive non-surgical treatment options.   4. At risk for constipation Constant is at increased risk for constipation due to inadequate water intake, changes in diet, and/or use of medications such as GLP1 agonists. [name] denies hard, infrequent stools currently.   Assessment/Plan:   1. Vitamin D deficiency Low Vitamin D level contributes to fatigue and are associated with obesity, breast, and colon cancer. He agrees to continue to take prescription Vitamin D @50 ,000 IU every week and will follow-up for routine testing of Vitamin D, at  least 2-3 times per year to avoid over-replacement. - Vitamin D (25 hydroxy) - Lipid Panel With LDL/HDL Ratio - HgB A1c - Insulin, random - Comprehensive Metabolic Panel (CMET) - Vitamin D, Ergocalciferol, (DRISDOL) 1.25 MG (50000 UNIT) CAPS capsule; Take 1 capsule (50,000 Units total) by mouth every 7 (seven) days.  Dispense: 4 capsule; Refill: 0  2. Other specified hypothyroidism Patient with long-standing hypothyroidism, on levothyroxine therapy. He appears euthyroid. Orders and follow up as documented in patient record.  Counseling . Good thyroid control is important for overall health. Supratherapeutic thyroid levels are dangerous and will not improve weight loss results. . The correct way to take levothyroxine is fasting, with water, separated by at least 30 minutes from breakfast, and separated by more than 4 hours from calcium, iron, multivitamins, acid reflux medications (PPIs).  - T3 - T4, free - TSH  3. Chronic low back pain, unspecified back pain laterality, unspecified whether sciatica present We will refer to Dr. , sports medicine. Will continue to monitor.   4. At risk for constipation [name] was given approximately 15 minutes of counseling today regarding prevention of constipation. He was encouraged to increase water and fiber intake.   5. Class 1 obesity with serious comorbidity and body mass index (BMI) of 30.0 to 30.9 in adult, unspecified obesity type [name] is currently in the action stage of change. As such, his goal is to continue with weight loss efforts. He has agreed to on the Category 3 Plan.   Exercise goals: Continue as is. Behavioral modification  strategies: increasing lean protein intake and no skipping meals.  [name] has agreed to follow-up with our clinic in 4 weeks. He was informed of the importance of frequent follow-up visits to maximize his success with intensive lifestyle modifications for his multiple health conditions.   [name] was  informed we would discuss his lab results at his next visit unless there is a critical issue that needs to be addressed sooner. [name] agreed to keep his next visit at the agreed upon time to discuss these results.  Objective:   Blood pressure 131/77, pulse 61, temperature 98.1 F (36.7 C), temperature source Oral, height 6' (1.829 m), weight 225 lb (102.1 kg), SpO2 99 %. Body mass index is 30.52 kg/m.  General: Cooperative, alert, well developed, in no acute distress. HEENT: Conjunctivae and lids unremarkable. Cardiovascular: Regular rhythm.  Lungs: Normal work of breathing. Neurologic: No focal deficits.   Lab Results  Component Value Date   CREATININE 0.83 01/27/2019   BUN 20 01/27/2019   NA 142 01/27/2019   K 4.5 01/27/2019   CL 103 01/27/2019   CO2 24 01/27/2019   Lab Results  Component Value Date   ALT 46 (H) 01/27/2019   AST 22 01/27/2019   ALKPHOS 51 01/27/2019   BILITOT 0.8 01/27/2019   Lab Results  Component Value Date   HGBA1C 5.2 01/27/2019   HGBA1C 5.2 09/07/2018   HGBA1C 5.1 03/15/2018   HGBA1C 5.2 11/02/2017   HGBA1C 5.1 06/22/2017   Lab Results  Component Value Date   INSULIN 8.2 01/27/2019   INSULIN 8.9 09/07/2018   INSULIN 6.1 03/15/2018   INSULIN 8.8 11/02/2017   INSULIN 8.0 06/22/2017   Lab Results  Component Value Date   TSH 3.490 01/27/2019   Lab Results  Component Value Date   CHOL 166 01/27/2019   HDL 43 01/27/2019   LDLCALC 103 (H) 01/27/2019   TRIG 109 01/27/2019   Lab Results  Component Value Date   WBC 4.7 06/22/2017   HGB 15.5 06/22/2017   HCT 46.5 06/22/2017   MCV 84 06/22/2017   PLT 173 01/09/2017     Attestation Statements:   Reviewed by clinician on day of visit: allergies, medications, problem list, medical history, surgical history, family history, social history, and previous encounter notes.  I, Renee Ramus, am acting as transcriptionist for Dennard Nip, MD .  I have reviewed the above documentation  for accuracy and completeness, and I agree with the above. -  Dennard Nip, MD

## 2019-01-31 ENCOUNTER — Ambulatory Visit (INDEPENDENT_AMBULATORY_CARE_PROVIDER_SITE_OTHER): Payer: Managed Care, Other (non HMO) | Admitting: Family Medicine

## 2019-01-31 MED ORDER — LEVOTHYROXINE SODIUM 75 MCG PO TABS: 75.0000 ug | ORAL_TABLET | Freq: Every day | ORAL | 0 refills | Status: DC

## 2019-02-17 ENCOUNTER — Other Ambulatory Visit (INDEPENDENT_AMBULATORY_CARE_PROVIDER_SITE_OTHER): Payer: Self-pay | Admitting: Family Medicine

## 2019-02-17 DIAGNOSIS — E559 Vitamin D deficiency, unspecified: Secondary | ICD-10-CM

## 2019-02-20 ENCOUNTER — Other Ambulatory Visit (INDEPENDENT_AMBULATORY_CARE_PROVIDER_SITE_OTHER): Payer: Self-pay | Admitting: Family Medicine

## 2019-02-20 DIAGNOSIS — E038 Other specified hypothyroidism: Secondary | ICD-10-CM

## 2019-02-21 ENCOUNTER — Telehealth: Payer: Self-pay

## 2019-02-21 ENCOUNTER — Encounter (INDEPENDENT_AMBULATORY_CARE_PROVIDER_SITE_OTHER): Payer: Self-pay | Admitting: Family Medicine

## 2019-02-21 ENCOUNTER — Other Ambulatory Visit: Payer: Self-pay

## 2019-02-21 ENCOUNTER — Ambulatory Visit (INDEPENDENT_AMBULATORY_CARE_PROVIDER_SITE_OTHER): Payer: Managed Care, Other (non HMO) | Admitting: Family Medicine

## 2019-02-21 VITALS — BP 148/77 | HR 70 | Temp 97.9°F | Ht 72.0 in | Wt 229.0 lb

## 2019-02-21 DIAGNOSIS — E038 Other specified hypothyroidism: Secondary | ICD-10-CM

## 2019-02-21 DIAGNOSIS — I10 Essential (primary) hypertension: Secondary | ICD-10-CM

## 2019-02-21 DIAGNOSIS — E669 Obesity, unspecified: Secondary | ICD-10-CM

## 2019-02-21 DIAGNOSIS — Z9189 Other specified personal risk factors, not elsewhere classified: Secondary | ICD-10-CM

## 2019-02-21 DIAGNOSIS — G4710 Hypersomnia, unspecified: Secondary | ICD-10-CM

## 2019-02-21 DIAGNOSIS — E559 Vitamin D deficiency, unspecified: Secondary | ICD-10-CM | POA: Diagnosis not present

## 2019-02-21 DIAGNOSIS — Z6831 Body mass index (BMI) 31.0-31.9, adult: Secondary | ICD-10-CM

## 2019-02-21 DIAGNOSIS — G4733 Obstructive sleep apnea (adult) (pediatric): Secondary | ICD-10-CM

## 2019-02-21 DIAGNOSIS — F901 Attention-deficit hyperactivity disorder, predominantly hyperactive type: Secondary | ICD-10-CM

## 2019-02-21 MED ORDER — LEVOTHYROXINE SODIUM 75 MCG PO TABS: 75.0000 ug | ORAL_TABLET | Freq: Every day | ORAL | 0 refills | Status: DC

## 2019-02-21 MED ORDER — CHLORTHALIDONE 25 MG PO TABS: 25.0000 mg | ORAL_TABLET | ORAL | 0 refills | Status: DC

## 2019-02-21 MED ORDER — VITAMIN D (ERGOCALCIFEROL) 1.25 MG (50000 UNIT) PO CAPS: 50000.0000 [IU] | ORAL_CAPSULE | ORAL | 0 refills | Status: DC

## 2019-02-21 NOTE — Progress Notes (Signed)
Chief Complaint:   OBESITY [name] is here to discuss his progress with his obesity treatment plan along with follow-up of his obesity related diagnoses. [name] is on the Category 3 Plan and states he is following his eating plan approximately 75% of the time. [name] states he is on the elliptical for 25 minutes 3-4 times per week.  Today's visit was #: 61 Starting weight: 253 lbs Starting date: 02/16/17 Today's weight: 229 lbs Today's date: 02/21/2019 Total lbs lost to date: 24 Total lbs lost since last in-office visit: 0  Interim History: [name] has deviated a bit more with his plan, but he is retaining some fluid today. He states his hunger is controlled and he is working on decreasing liquid calories.  Subjective:   1. Essential hypertension [name] has had some borderline elevated blood pressure readings, and he is retaining a bit of fluid.  2. Vitamin D deficiency [name] is stable on Vit D, and he requests a refill today.  3. Other specified hypothyroidism [name] is stable on levothyroxine. He denies palpitations or worsening fatigue.  4. At risk for heart disease [name] is at a higher than average risk for cardiovascular disease due to obesity. Reviewed: no chest pain on exertion, no dyspnea on exertion, and no swelling of ankles.  Assessment/Plan:   1. Essential hypertension [name] is working on healthy weight loss and exercise to improve blood pressure control. We will watch for signs of hypotension as he continues his lifestyle modifications. [name] agreed to start chlorthalidone 12.5 mg daily with no refill. We will recheck his blood pressure in 1 month.  - chlorthalidone (HYGROTON) 25 MG tablet; Take 1 tablet (25 mg total) by mouth as directed. Take 1/2 tab daily  Dispense: 30 tablet; Refill: 0  2. Vitamin D deficiency Low Vitamin D level contributes to fatigue and are associated with obesity, breast, and colon cancer. We will refill prescription Vitamin D for 1  month. [name] will follow-up for routine testing of Vitamin D, at least 2-3 times per year to avoid over-replacement.  - Vitamin D, Ergocalciferol, (DRISDOL) 1.25 MG (50000 UNIT) CAPS capsule; Take 1 capsule (50,000 Units total) by mouth every 7 (seven) days.  Dispense: 4 capsule; Refill: 0  3. Other specified hypothyroidism Patient with long-standing hypothyroidism, on levothyroxine therapy. He appears euthyroid. We will refill levothyroxine for 1 month. Orders and follow up as documented in patient record. .   - levothyroxine (SYNTHROID) 75 MCG tablet; Take 1 tablet (75 mcg total) by mouth daily before breakfast.  Dispense: 30 tablet; Refill: 0  4. At risk for heart disease [name] was given approximately 15 minutes of coronary artery disease prevention counseling today. He is 54 y.o. male and has risk factors for heart disease including obesity. We discussed intensive lifestyle modifications today with an emphasis on specific weight loss instructions and strategies.   Repetitive spaced learning was employed today to elicit superior memory formation and behavioral change.  5. Class 1 obesity with serious comorbidity and body mass index (BMI) of 31.0 to 31.9 in adult, unspecified obesity type [name] is currently in the action stage of change. As such, his goal is to continue with weight loss efforts. He has agreed to the Category 3 Plan.    Behavioral modification strategies: decreasing liquid calories and decreasing sodium intake.  [name] has agreed to follow-up with our clinic in 4 weeks. He was informed of the importance of frequent follow-up visits to maximize his success with intensive lifestyle modifications for his  multiple health conditions.   Objective:   Blood pressure (!) 148/77, pulse 70, temperature 97.9 F (36.6 C), temperature source Oral, height 6' (1.829 m), weight 229 lb (103.9 kg), SpO2 97 %. Body mass index is 31.06 kg/m.  General: Cooperative, alert, well developed,  in no acute distress. HEENT: Conjunctivae and lids unremarkable. Cardiovascular: Regular rhythm.  Lungs: Normal work of breathing. Neurologic: No focal deficits.   Lab Results  Component Value Date   CREATININE 0.83 01/27/2019   BUN 20 01/27/2019   NA 142 01/27/2019   K 4.5 01/27/2019   CL 103 01/27/2019   CO2 24 01/27/2019   Lab Results  Component Value Date   ALT 46 (H) 01/27/2019   AST 22 01/27/2019   ALKPHOS 51 01/27/2019   BILITOT 0.8 01/27/2019   Lab Results  Component Value Date   HGBA1C 5.2 01/27/2019   HGBA1C 5.2 09/07/2018   HGBA1C 5.1 03/15/2018   HGBA1C 5.2 11/02/2017   HGBA1C 5.1 06/22/2017   Lab Results  Component Value Date   INSULIN 8.2 01/27/2019   INSULIN 8.9 09/07/2018   INSULIN 6.1 03/15/2018   INSULIN 8.8 11/02/2017   INSULIN 8.0 06/22/2017   Lab Results  Component Value Date   TSH 3.490 01/27/2019   Lab Results  Component Value Date   CHOL 166 01/27/2019   HDL 43 01/27/2019   LDLCALC 103 (H) 01/27/2019   TRIG 109 01/27/2019   Lab Results  Component Value Date   WBC 4.7 06/22/2017   HGB 15.5 06/22/2017   HCT 46.5 06/22/2017   MCV 84 06/22/2017   PLT 173 01/09/2017   No results found for: IRON, TIBC, FERRITIN  Attestation Statements:   Reviewed by clinician on day of visit: allergies, medications, problem list, medical history, surgical history, family history, social history, and previous encounter notes.   I, Burt Knack, am acting as transcriptionist for Quillian Quince, MD.  I have reviewed the above documentation for accuracy and completeness, and I agree with the above. -  Quillian Quince, MD

## 2019-02-21 NOTE — Telephone Encounter (Signed)
1) Medication(s) Requested (by name): methylphenidate (RITALIN) 10 MG tablet   2) Pharmacy of Choice: [id number] - [place], [place] - [address]  [address], [place] [address]   3) Special Requests:   Patient wanted a note that states without his medication he will have a rough couple of days

## 2019-02-21 NOTE — Telephone Encounter (Signed)
[facility] Database Verified LR: 11-16-2018 Qty: 90 Pending appointment: 03-21-2019 (Amy)

## 2019-02-22 MED ORDER — METHYLPHENIDATE HCL 10 MG PO TABS: 10.0000 mg | ORAL_TABLET | Freq: Three times a day (TID) | ORAL | 0 refills | Status: DC

## 2019-03-14 ENCOUNTER — Other Ambulatory Visit (INDEPENDENT_AMBULATORY_CARE_PROVIDER_SITE_OTHER): Payer: Self-pay | Admitting: Family Medicine

## 2019-03-14 DIAGNOSIS — E559 Vitamin D deficiency, unspecified: Secondary | ICD-10-CM

## 2019-03-14 DIAGNOSIS — E038 Other specified hypothyroidism: Secondary | ICD-10-CM

## 2019-03-16 ENCOUNTER — Other Ambulatory Visit (INDEPENDENT_AMBULATORY_CARE_PROVIDER_SITE_OTHER): Payer: Self-pay | Admitting: Family Medicine

## 2019-03-16 DIAGNOSIS — I10 Essential (primary) hypertension: Secondary | ICD-10-CM

## 2019-03-16 NOTE — Telephone Encounter (Signed)
error 

## 2019-03-18 ENCOUNTER — Encounter (INDEPENDENT_AMBULATORY_CARE_PROVIDER_SITE_OTHER): Payer: Self-pay | Admitting: Family Medicine

## 2019-03-21 ENCOUNTER — Ambulatory Visit: Payer: Managed Care, Other (non HMO) | Admitting: Family Medicine

## 2019-03-21 ENCOUNTER — Ambulatory Visit (INDEPENDENT_AMBULATORY_CARE_PROVIDER_SITE_OTHER): Payer: Managed Care, Other (non HMO) | Admitting: Family Medicine

## 2019-03-29 ENCOUNTER — Other Ambulatory Visit: Payer: Self-pay

## 2019-03-29 ENCOUNTER — Ambulatory Visit (INDEPENDENT_AMBULATORY_CARE_PROVIDER_SITE_OTHER): Payer: Managed Care, Other (non HMO) | Admitting: Physician Assistant

## 2019-03-29 ENCOUNTER — Encounter (INDEPENDENT_AMBULATORY_CARE_PROVIDER_SITE_OTHER): Payer: Self-pay | Admitting: Physician Assistant

## 2019-03-29 VITALS — BP 161/85 | HR 74 | Temp 98.3°F | Ht 72.0 in | Wt 233.0 lb

## 2019-03-29 DIAGNOSIS — E559 Vitamin D deficiency, unspecified: Secondary | ICD-10-CM | POA: Diagnosis not present

## 2019-03-29 DIAGNOSIS — Z9189 Other specified personal risk factors, not elsewhere classified: Secondary | ICD-10-CM

## 2019-03-29 DIAGNOSIS — E669 Obesity, unspecified: Secondary | ICD-10-CM

## 2019-03-29 DIAGNOSIS — I10 Essential (primary) hypertension: Secondary | ICD-10-CM

## 2019-03-29 DIAGNOSIS — Z6831 Body mass index (BMI) 31.0-31.9, adult: Secondary | ICD-10-CM

## 2019-03-29 DIAGNOSIS — E038 Other specified hypothyroidism: Secondary | ICD-10-CM

## 2019-03-29 MED ORDER — LEVOTHYROXINE SODIUM 75 MCG PO TABS: 75.0000 ug | ORAL_TABLET | Freq: Every day | ORAL | 1 refills | Status: DC

## 2019-03-29 MED ORDER — VITAMIN D (ERGOCALCIFEROL) 1.25 MG (50000 UNIT) PO CAPS: 50000.0000 [IU] | ORAL_CAPSULE | ORAL | 0 refills | Status: DC

## 2019-03-29 NOTE — Progress Notes (Signed)
Chief Complaint:   OBESITY [name] is here to discuss his progress with his obesity treatment plan along with follow-up of his obesity related diagnoses. [name] is on the Category 3 Plan and states he is following his eating plan approximately 80% of the time. [name] states he is exercising on the elliptical 30 minutes 2-3 times per week.  Today's visit was #: 28 Starting weight: 253 lbs Starting date: 02/16/2017 Today's weight: 233 lbs Today's date: 03/29/2019 Total lbs lost to date: 20 Total lbs lost since last in-office visit: 0  Interim History: [name] reports that he has been under a lot of stress recently. He just returned from [place] seeing his brother for the last time. He also has extended family living with him and so there is food in the house that isn't normally there.  Subjective:   Vitamin D deficiency. Last Vitamin D level was not at goal - 44.2 on 01/27/2019. [name] is on Vitamin D weekly. No nausea, vomiting, or muscle weakness.  Other specified hypothyroidism. [name] is on levothyroxine. No excessive fatigue or heat/cold intolerance.  Lab Results  Component Value Date   TSH 3.490 01/27/2019   Essential hypertension. [name] stopped taking his chlorthalidone on his own due to sexual side effects. His blood pressure is elevated today. No chest pain or headache. He stopped drinking alcohol recently.   BP Readings from Last 3 Encounters:  03/29/19 (!) 161/85  02/21/19 (!) 148/77  01/27/19 131/77   Lab Results  Component Value Date   CREATININE 0.83 01/27/2019   CREATININE 0.90 09/07/2018   CREATININE 0.99 03/15/2018   At risk for heart disease. [name] is at a higher than average risk for cardiovascular disease due to obesity. Reviewed: no chest pain on exertion, no dyspnea on exertion, and no swelling of ankles.  Assessment/Plan:   Vitamin D deficiency. Low Vitamin D level contributes to fatigue and are associated with obesity, breast, and colon  cancer. He was given a refill on his Vitamin D, Ergocalciferol, (DRISDOL) 1.25 MG (50000 UNIT) CAPS capsule every week #4 with 0 refills and will follow-up for routine testing of Vitamin D, at least 2-3 times per year to avoid over-replacement.    Other specified hypothyroidism. Patient with long-standing hypothyroidism, on levothyroxine therapy. He appears euthyroid. Orders and follow up as documented in patient record. [name] was given a refill on his levothyroxine (SYNTHROID) 75 MCG tablet #30 with 1 refill.  Counseling . Good thyroid control is important for overall health. Supratherapeutic thyroid levels are dangerous and will not improve weight loss results. . The correct way to take levothyroxine is fasting, with water, separated by at least 30 minutes from breakfast, and separated by more than 4 hours from calcium, iron, multivitamins, acid reflux medications (PPIs).     Essential hypertension. [name] is working on healthy weight loss and exercise to improve blood pressure control. We will watch for signs of hypotension as he continues his lifestyle modifications. He states he will follow-up with his PCP regarding his blood pressure.  At risk for heart disease. [name] was given approximately 15 minutes of coronary artery disease prevention counseling today. He is 54 y.o. male and has risk factors for heart disease including obesity. We discussed intensive lifestyle modifications today with an emphasis on specific weight loss instructions and strategies.   Repetitive spaced learning was employed today to elicit superior memory formation and behavioral change.  Class 1 obesity with serious comorbidity and body mass index (BMI) of 31.0  to 31.9 in adult, unspecified obesity type.  [name] is currently in the action stage of change. As such, his goal is to continue with weight loss efforts. He has agreed to the Category 3 Plan.   Exercise goals: For substantial health benefits, adults should do  at least 150 minutes (2 hours and 30 minutes) a week of moderate-intensity, or 75 minutes (1 hour and 15 minutes) a week of vigorous-intensity aerobic physical activity, or an equivalent combination of moderate- and vigorous-intensity aerobic activity. Aerobic activity should be performed in episodes of at least 10 minutes, and preferably, it should be spread throughout the week.  Behavioral modification strategies: meal planning and cooking strategies and keeping healthy foods in the home.  [name] has agreed to follow-up with our clinic in 4 weeks. He was informed of the importance of frequent follow-up visits to maximize his success with intensive lifestyle modifications for his multiple health conditions.   Objective:   Blood pressure (!) 161/85, pulse 74, temperature 98.3 F (36.8 C), height 6' (1.829 m), weight 233 lb (105.7 kg), SpO2 100 %. Body mass index is 31.6 kg/m.  General: Cooperative, alert, well developed, in no acute distress. HEENT: Conjunctivae and lids unremarkable. Cardiovascular: Regular rhythm.  Lungs: Normal work of breathing. Neurologic: No focal deficits.   Lab Results  Component Value Date   CREATININE 0.83 01/27/2019   BUN 20 01/27/2019   NA 142 01/27/2019   K 4.5 01/27/2019   CL 103 01/27/2019   CO2 24 01/27/2019   Lab Results  Component Value Date   ALT 46 (H) 01/27/2019   AST 22 01/27/2019   ALKPHOS 51 01/27/2019   BILITOT 0.8 01/27/2019   Lab Results  Component Value Date   HGBA1C 5.2 01/27/2019   HGBA1C 5.2 09/07/2018   HGBA1C 5.1 03/15/2018   HGBA1C 5.2 11/02/2017   HGBA1C 5.1 06/22/2017   Lab Results  Component Value Date   INSULIN 8.2 01/27/2019   INSULIN 8.9 09/07/2018   INSULIN 6.1 03/15/2018   INSULIN 8.8 11/02/2017   INSULIN 8.0 06/22/2017   Lab Results  Component Value Date   TSH 3.490 01/27/2019   Lab Results  Component Value Date   CHOL 166 01/27/2019   HDL 43 01/27/2019   LDLCALC 103 (H) 01/27/2019   TRIG 109  01/27/2019   Lab Results  Component Value Date   WBC 4.7 06/22/2017   HGB 15.5 06/22/2017   HCT 46.5 06/22/2017   MCV 84 06/22/2017   PLT 173 01/09/2017   No results found for: IRON, TIBC, FERRITIN  Attestation Statements:   Reviewed by clinician on day of visit: allergies, medications, problem list, medical history, surgical history, family history, social history, and previous encounter notes.  IMarianna Payment, am acting as transcriptionist for Alois Cliche, PA-C   I have reviewed the above documentation for accuracy and completeness, and I agree with the above. Alois Cliche, PA-C

## 2019-04-11 ENCOUNTER — Ambulatory Visit: Payer: Managed Care, Other (non HMO) | Admitting: Family Medicine

## 2019-04-11 ENCOUNTER — Other Ambulatory Visit: Payer: Self-pay

## 2019-04-11 ENCOUNTER — Encounter: Payer: Self-pay | Admitting: Family Medicine

## 2019-04-11 VITALS — BP 132/74 | HR 74 | Temp 97.9°F | Ht 72.0 in | Wt 225.0 lb

## 2019-04-11 DIAGNOSIS — F901 Attention-deficit hyperactivity disorder, predominantly hyperactive type: Secondary | ICD-10-CM | POA: Diagnosis not present

## 2019-04-11 DIAGNOSIS — F514 Sleep terrors [night terrors]: Secondary | ICD-10-CM | POA: Diagnosis not present

## 2019-04-11 DIAGNOSIS — G4733 Obstructive sleep apnea (adult) (pediatric): Secondary | ICD-10-CM | POA: Diagnosis not present

## 2019-04-11 MED ORDER — METHYLPHENIDATE HCL ER (XR) 30 MG PO CP24: 30.0000 mg | ORAL_CAPSULE | Freq: Every day | ORAL | 0 refills | Status: DC

## 2019-04-11 NOTE — Patient Instructions (Signed)
We will switch to an extended release methylphenidate to see if it lasts a little longer for you. Stop immediate release for now. Work on healthy lifestyle habits with healthy well balanced diet and regular exercise.   Follow up in 6 months   Attention Deficit Hyperactivity Disorder, Adult Attention deficit hyperactivity disorder (ADHD) is a mental health disorder that starts during childhood (neurodevelopmental disorder). For many people with ADHD, the disorder continues into the adult years. Treatment can help you manage your symptoms. What are the causes? The exact cause of ADHD is not known. Most experts believe genetics and environmental factors contribute to ADHD. What increases the risk? The following factors may make you more likely to develop this condition:  Having a family history of ADHD.  Being male.  Being born to a mother who smoked or drank alcohol during pregnancy.  Being exposed to lead or other toxins in the womb or early in life.  Being born before 37 weeks of pregnancy (prematurely) or at a low birth weight.  Having experienced a brain injury. What are the signs or symptoms? Symptoms of this condition depend on the type of ADHD. The two main types are inattentive and hyperactive-impulsive. Some people may have symptoms of both types. Symptoms of the inattentive type include:  Difficulty paying attention.  Making careless mistakes.  Not following instructions.  Being disorganized.  Avoiding tasks that require time and attention.  Losing and forgetting things.  Being easily distracted. Symptoms of the hyperactive-impulsive type include:  Restlessness.  Talking too much.  Interrupting.  Difficulty with: ? Sitting still. ? Feeling motivated. ? Relaxing. ? Waiting in line or waiting for a turn. In adults, this condition may lead to certain problems, such as:  Keeping jobs.  Performing tasks at work.  Having stable relationships.  Being on  time or keeping to a schedule. How is this diagnosed? This condition is diagnosed based on your current symptoms and your history of symptoms. The diagnosis can be made by a health care provider such as a primary care provider or a mental health care specialist. Your health care provider may use a symptom checklist or a behavior rating scale to evaluate your symptoms. He or she may also want to talk with people who have observed your behaviors throughout your life. How is this treated? This condition can be treated with medicines and behavior therapy. Medicines may be the best option to reduce impulsive behaviors and improve attention. Your health care provider may recommend:  Stimulant medicines. These are the most common medicines used for adult ADHD. They affect certain chemicals in the brain (neurotransmitters) and improve your ability to control your symptoms.  A non-stimulant medicine for adult ADHD (atomoxetine). This medicine increases a neurotransmitter called norepinephrine. It may take weeks to months to see effects from this medicine. Counseling and behavioral management are also important for treating ADHD. Counseling is often used along with medicine. Your health care provider may suggest:  Cognitive behavioral therapy (CBT). This type of therapy teaches you to replace negative thoughts and actions with positive thoughts and actions. When used as part of ADHD treatment, this therapy may also include: ? Coping strategies for organization, time management, impulse control, and stress reduction. ? Mindfulness and meditation training.  Behavioral management. You may work with a Psychologist, occupational who is specially trained to help people with ADHD manage and organize activities and function more effectively. Follow these instructions at home: Medicines   Take over-the-counter and prescription medicines only as told  by your health care provider.  Talk with your health care provider about the  possible side effects of your medicines and how to manage them. Lifestyle   Do not use drugs.  Do not drink alcohol if: ? Your health care provider tells you not to drink. ? You are pregnant, may be pregnant, or are planning to become pregnant.  If you drink alcohol: ? Limit how much you use to:  0-1 drink a day for women.  0-2 drinks a day for men. ? Be aware of how much alcohol is in your drink. In the [place] one drink equals one 12 oz bottle of beer (355 mL), one 5 oz glass of wine (148 mL), or one 1 oz glass of hard liquor (44 mL).  Get enough sleep.  Eat a healthy diet.  Exercise regularly. Exercise can help to reduce stress and anxiety. General instructions  Learn as much as you can about adult ADHD, and work closely with your health care providers to find the treatments that work best for you.  Follow the same schedule each day.  Use reminder devices like notes, calendars, and phone apps to stay on time and organized.  Keep all follow-up visits as told by your health care provider and therapist. This is important. Where to find more information A health care provider may be able to recommend resources that are available online or over the phone. You could start with:  Attention Deficit Disorder Association (ADDA): PubAddiction.co.nz  National Institute of Mental Health [employer]): https://carter.com/ Contact a health care provider if:  Your symptoms continue to cause problems.  You have side effects from your medicine, such as: ? Repeated muscle twitches, coughing, or speech outbursts. ? Sleep problems. ? Loss of appetite. ? Dizziness. ? Unusually fast heartbeat. ? Stomach pains. ? Headaches.  You are struggling with anxiety, depression, or substance abuse. Get help right away if you:  Have a severe reaction to a medicine. If you ever feel like you may hurt yourself or others, or have thoughts about taking your own life, get help right away. You can go to the nearest  emergency department or call:  Your local emergency services (911 in the [place]  A suicide crisis helpline, such as the [facility] at 772-758-2910. This is open 24 hours a day. Summary  ADHD is a mental health disorder that starts during childhood (neurodevelopmental disorder) and often continues into the adult years.  The exact cause of ADHD is not known. Most experts believe genetics and environmental factors contribute to ADHD.  There is no cure for ADHD, but treatment with medicine, cognitive behavioral therapy, or behavioral management can help you manage your condition. This information is not intended to replace advice given to you by your health care provider. Make sure you discuss any questions you have with your health care provider. Document Revised: 05/24/2018 Document Reviewed: 05/24/2018 Elsevier Patient Education  [place].
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
## 2019-04-19 ENCOUNTER — Other Ambulatory Visit (INDEPENDENT_AMBULATORY_CARE_PROVIDER_SITE_OTHER): Payer: Self-pay | Admitting: Physician Assistant

## 2019-04-19 DIAGNOSIS — E559 Vitamin D deficiency, unspecified: Secondary | ICD-10-CM

## 2019-04-25 ENCOUNTER — Other Ambulatory Visit: Payer: Self-pay

## 2019-04-25 ENCOUNTER — Ambulatory Visit (INDEPENDENT_AMBULATORY_CARE_PROVIDER_SITE_OTHER): Payer: Managed Care, Other (non HMO) | Admitting: Physician Assistant

## 2019-04-25 ENCOUNTER — Encounter (INDEPENDENT_AMBULATORY_CARE_PROVIDER_SITE_OTHER): Payer: Self-pay | Admitting: Physician Assistant

## 2019-04-25 VITALS — BP 123/74 | HR 61 | Temp 98.0°F | Ht 72.0 in | Wt 227.0 lb

## 2019-04-25 DIAGNOSIS — E038 Other specified hypothyroidism: Secondary | ICD-10-CM | POA: Diagnosis not present

## 2019-04-25 DIAGNOSIS — Z9189 Other specified personal risk factors, not elsewhere classified: Secondary | ICD-10-CM

## 2019-04-25 DIAGNOSIS — Z6830 Body mass index (BMI) 30.0-30.9, adult: Secondary | ICD-10-CM

## 2019-04-25 DIAGNOSIS — E559 Vitamin D deficiency, unspecified: Secondary | ICD-10-CM

## 2019-04-25 DIAGNOSIS — E669 Obesity, unspecified: Secondary | ICD-10-CM

## 2019-04-25 MED ORDER — VITAMIN D (ERGOCALCIFEROL) 1.25 MG (50000 UNIT) PO CAPS: 50000.0000 [IU] | ORAL_CAPSULE | ORAL | 0 refills | Status: DC

## 2019-04-25 MED ORDER — LEVOTHYROXINE SODIUM 75 MCG PO TABS: 75.0000 ug | ORAL_TABLET | Freq: Every day | ORAL | 0 refills | Status: DC

## 2019-04-25 NOTE — Progress Notes (Signed)
Chief Complaint:   OBESITY [name] is here to discuss his progress with his obesity treatment plan along with follow-up of his obesity related diagnoses. [name] is on the Category 3 Plan +300 calories and states he is following his eating plan approximately 85% of the time. [name] states he is elliptical and weight lifting for 30-45 minutes 3 times per week.  Today's visit was #: 29 Starting weight: 253 lbs Starting date: 02/16/2017 Today's weight: 227 lbs Today's date: 04/25/2019 Total lbs lost to date: 26 lbs Total lbs lost since last in-office visit: 6 lbs  Interim History: [name] states that he is doing well with the plan.  He notes hunger with dinner at times.  He has stopped drinking alcohol.  Subjective:   1. Other specified hypothyroidism [name] is taking levothyroxine 75 mcg daily.  No excessive fatigue or heat/cold intolerance.  Lab Results  Component Value Date   TSH 3.490 01/27/2019   2. Vitamin D deficiency [name]'s Vitamin D level was 44.2 on 01/27/2019. He is currently taking prescription vitamin D 50,000 IU each week. He denies nausea, vomiting or muscle weakness.    3. At risk for osteoporosis [name] is at higher risk of osteopenia and osteoporosis due to Vitamin D deficiency.   Assessment/Plan:   1. Other specified hypothyroidism Patient with long-standing hypothyroidism, on levothyroxine therapy. He appears euthyroid. Orders and follow up as documented in patient record.  Counseling . Good thyroid control is important for overall health. Supratherapeutic thyroid levels are dangerous and will not improve weight loss results. . The correct way to take levothyroxine is fasting, with water, separated by at least 30 minutes from breakfast, and separated by more than 4 hours from calcium, iron, multivitamins, acid reflux medications (PPIs).  - levothyroxine (SYNTHROID) 75 MCG tablet; Take 1 tablet (75 mcg total) by mouth daily before breakfast.  Dispense: 30  tablet; Refill: 0  2. Vitamin D deficiency Low Vitamin D level contributes to fatigue and are associated with obesity, breast, and colon cancer. He agrees to continue to take prescription Vitamin D @50 ,000 IU every week and will follow-up for routine testing of Vitamin D, at least 2-3 times per year to avoid over-replacement. - Vitamin D, Ergocalciferol, (DRISDOL) 1.25 MG (50000 UNIT) CAPS capsule; Take 1 capsule (50,000 Units total) by mouth every 7 (seven) days.  Dispense: 4 capsule; Refill: 0  3. At risk for osteoporosis [name] was given approximately 15 minutes of osteoporosis prevention counseling today. [name] is at risk for osteopenia and osteoporosis due to his Vitamin D deficiency. He was encouraged to take his Vitamin D and follow his higher calcium diet and increase strengthening exercise to help strengthen his bones and decrease his risk of osteopenia and osteoporosis.  Repetitive spaced learning was employed today to elicit superior memory formation and behavioral change.  4. Class 1 obesity with serious comorbidity and body mass index (BMI) of 30.0 to 30.9 in adult, unspecified obesity type [name] is currently in the action stage of change. As such, his goal is to continue with weight loss efforts. He has agreed to the Category 3 Plan +300 calories.   Exercise goals: As is.  Behavioral modification strategies: meal planning and cooking strategies and keeping healthy foods in the home.  [name] has agreed to follow-up with our clinic in 4 weeks. He was informed of the importance of frequent follow-up visits to maximize his success with intensive lifestyle modifications for his multiple health conditions.   Objective:   Blood pressure  123/74, pulse 61, temperature 98 F (36.7 C), temperature source Oral, height 6' (1.829 m), weight 227 lb (103 kg), SpO2 99 %. Body mass index is 30.79 kg/m.  General: Cooperative, alert, well developed, in no acute distress. HEENT: Conjunctivae  and lids unremarkable. Cardiovascular: Regular rhythm.  Lungs: Normal work of breathing. Neurologic: No focal deficits.   Lab Results  Component Value Date   CREATININE 0.83 01/27/2019   BUN 20 01/27/2019   NA 142 01/27/2019   K 4.5 01/27/2019   CL 103 01/27/2019   CO2 24 01/27/2019   Lab Results  Component Value Date   ALT 46 (H) 01/27/2019   AST 22 01/27/2019   ALKPHOS 51 01/27/2019   BILITOT 0.8 01/27/2019   Lab Results  Component Value Date   HGBA1C 5.2 01/27/2019   HGBA1C 5.2 09/07/2018   HGBA1C 5.1 03/15/2018   HGBA1C 5.2 11/02/2017   HGBA1C 5.1 06/22/2017   Lab Results  Component Value Date   INSULIN 8.2 01/27/2019   INSULIN 8.9 09/07/2018   INSULIN 6.1 03/15/2018   INSULIN 8.8 11/02/2017   INSULIN 8.0 06/22/2017   Lab Results  Component Value Date   TSH 3.490 01/27/2019   Lab Results  Component Value Date   CHOL 166 01/27/2019   HDL 43 01/27/2019   LDLCALC 103 (H) 01/27/2019   TRIG 109 01/27/2019   Lab Results  Component Value Date   WBC 4.7 06/22/2017   HGB 15.5 06/22/2017   HCT 46.5 06/22/2017   MCV 84 06/22/2017   PLT 173 01/09/2017   Attestation Statements:   Reviewed by clinician on day of visit: allergies, medications, problem list, medical history, surgical history, family history, social history, and previous encounter notes.  I, Water quality scientist, CMA, am acting as Location manager for [employer], PA-C.  I have reviewed the above documentation for accuracy and completeness, and I agree with the above. Abby Potash, PA-C

## 2019-05-15 ENCOUNTER — Other Ambulatory Visit (INDEPENDENT_AMBULATORY_CARE_PROVIDER_SITE_OTHER): Payer: Self-pay | Admitting: Physician Assistant

## 2019-05-15 DIAGNOSIS — E038 Other specified hypothyroidism: Secondary | ICD-10-CM

## 2019-05-16 ENCOUNTER — Other Ambulatory Visit: Payer: Self-pay | Admitting: Family Medicine

## 2019-05-16 MED ORDER — METHYLPHENIDATE HCL ER (XR) 30 MG PO CP24: 30.0000 mg | ORAL_CAPSULE | Freq: Every day | ORAL | 0 refills | Status: DC

## 2019-05-16 NOTE — Addendum Note (Signed)
Addended by: Hermenia Fiscal S on: 05/16/2019 12:07 PM   Modules accepted: Orders

## 2019-05-16 NOTE — Telephone Encounter (Signed)
Pt has called for a refill on his Methylphenidate HCl ER, XR, 30 MG CP24 [facility] [phone]

## 2019-05-23 ENCOUNTER — Ambulatory Visit (INDEPENDENT_AMBULATORY_CARE_PROVIDER_SITE_OTHER): Payer: Managed Care, Other (non HMO) | Admitting: Physician Assistant

## 2019-05-30 ENCOUNTER — Other Ambulatory Visit: Payer: Self-pay

## 2019-05-30 ENCOUNTER — Encounter (INDEPENDENT_AMBULATORY_CARE_PROVIDER_SITE_OTHER): Payer: Self-pay

## 2019-05-30 ENCOUNTER — Ambulatory Visit (INDEPENDENT_AMBULATORY_CARE_PROVIDER_SITE_OTHER): Payer: Managed Care, Other (non HMO) | Admitting: Physician Assistant

## 2019-05-31 ENCOUNTER — Encounter (INDEPENDENT_AMBULATORY_CARE_PROVIDER_SITE_OTHER): Payer: Self-pay | Admitting: Physician Assistant

## 2019-05-31 ENCOUNTER — Ambulatory Visit (INDEPENDENT_AMBULATORY_CARE_PROVIDER_SITE_OTHER): Payer: Managed Care, Other (non HMO) | Admitting: Physician Assistant

## 2019-05-31 VITALS — BP 129/76 | HR 55 | Temp 98.2°F | Ht 72.0 in | Wt 227.0 lb

## 2019-05-31 DIAGNOSIS — Z6830 Body mass index (BMI) 30.0-30.9, adult: Secondary | ICD-10-CM

## 2019-05-31 DIAGNOSIS — Z9189 Other specified personal risk factors, not elsewhere classified: Secondary | ICD-10-CM | POA: Diagnosis not present

## 2019-05-31 DIAGNOSIS — E559 Vitamin D deficiency, unspecified: Secondary | ICD-10-CM

## 2019-05-31 DIAGNOSIS — E038 Other specified hypothyroidism: Secondary | ICD-10-CM | POA: Diagnosis not present

## 2019-05-31 DIAGNOSIS — E669 Obesity, unspecified: Secondary | ICD-10-CM | POA: Diagnosis not present

## 2019-05-31 MED ORDER — LEVOTHYROXINE SODIUM 75 MCG PO TABS: 75.0000 ug | ORAL_TABLET | Freq: Every day | ORAL | 0 refills | Status: DC

## 2019-05-31 MED ORDER — VITAMIN D (ERGOCALCIFEROL) 1.25 MG (50000 UNIT) PO CAPS: 50000.0000 [IU] | ORAL_CAPSULE | ORAL | 0 refills | Status: DC

## 2019-05-31 NOTE — Progress Notes (Signed)
Chief Complaint:   OBESITY [name] is here to discuss his progress with his obesity treatment plan along with follow-up of his obesity related diagnoses. [name] is on the Category 3 Plan + 300 calories and states he is following his eating plan approximately 80% of the time. [name] states he is doing weight lifting and cardio for 20-60 minutes 3 times per week.  Today's visit was #: 30 Starting weight: 253 lbs Starting date: 02/16/2017 Today's weight: 227 lbs Today's date: 05/31/2019 Total lbs lost to date: 26 Total lbs lost since last in-office visit: 0  Interim History: [name] reports that he is traveling more with his job and does better with his plan when he travels. 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 He notes he is not drinking enough water some days. His primary care physician is going to restart him on Lexapro. He states that he has taken it before and had side effects with it, which is the reason it was discontinued.  Subjective:   1. Vitamin D deficiency [name] is on Vit D and he denies nausea, vomiting, or muscle weakness.  2. Other specified hypothyroidism [name] is on levothyroxine. He denies heat or cold intolerance or excessive fatigue.  3. At risk for side effect of medication [name] is at risk for drug side effects due to restarting Lexapro.  Assessment/Plan:   1. Vitamin D deficiency Low Vitamin D level contributes to fatigue and are associated with obesity, breast, and colon cancer. He agrees to continue to take prescription Vitamin D for 1 month. [facility] will follow-up for routine testing of Vitamin D, at least 2-3 times per year to avoid over-replacement.  - Vitamin D, Ergocalciferol, (DRISDOL) 1.25 MG (50000 UNIT) CAPS capsule; Take 1 capsule (50,000 Units total) by mouth every 7 (seven) days.  Dispense: 4 capsule; Refill: 0  2. Other specified hypothyroidism Patient with long-standing hypothyroidism, on  levothyroxine therapy. He appears euthyroid. We will refill levothyroxine for 1 month. Orders and follow up as documented in patient record.  Counseling . Good thyroid control is important for overall health. Supratherapeutic thyroid levels are dangerous and will not improve weight loss results. . The correct way to take levothyroxine is fasting, with water, separated by at least 30 minutes from breakfast, and separated by more than 4 hours from calcium, iron, multivitamins, acid reflux medications (PPIs).   - levothyroxine (SYNTHROID) 75 MCG tablet; Take 1 tablet (75 mcg total) by mouth daily before breakfast.  Dispense: 30 tablet; Refill: 0  3. At risk for side effect of medication [name] was given approximately 15 minutes of drug side effect counseling today.  We discussed side effect possibility and risk versus benefits. [name] agreed to the medication and will contact this office if these side effects are intolerable.  Repetitive spaced learning was employed today to elicit superior memory formation and behavioral change.  4. Class 1 obesity with serious comorbidity and body mass index (BMI) of 30.0 to 30.9 in adult, unspecified obesity type Le is currently in the action stage of change. As such, his goal is to continue with weight loss efforts. He has agreed to the Category 3 Plan.   Exercise goals: As is.  Behavioral modification strategies: increasing water intake, meal planning and cooking strategies and keeping healthy foods in the home.  [name] has agreed to follow-up with our clinic in 4 weeks. He was informed of the importance of frequent follow-up visits to maximize his success  with intensive lifestyle modifications for his multiple health conditions.   Objective:   Blood pressure 129/76, pulse (!) 55, temperature 98.2 F (36.8 C), temperature source Oral, height 6' (1.829 m), weight 227 lb (103 kg), SpO2 97 %. Body mass index is 30.79 kg/m.  General: Cooperative,  alert, well developed, in no acute distress. HEENT: Conjunctivae and lids unremarkable. Cardiovascular: Regular rhythm.  Lungs: Normal work of breathing. Neurologic: No focal deficits.   Lab Results  Component Value Date   CREATININE 0.83 01/27/2019   BUN 20 01/27/2019   NA 142 01/27/2019   K 4.5 01/27/2019   CL 103 01/27/2019   CO2 24 01/27/2019   Lab Results  Component Value Date   ALT 46 (H) 01/27/2019   AST 22 01/27/2019   ALKPHOS 51 01/27/2019   BILITOT 0.8 01/27/2019   Lab Results  Component Value Date   HGBA1C 5.2 01/27/2019   HGBA1C 5.2 09/07/2018   HGBA1C 5.1 03/15/2018   HGBA1C 5.2 11/02/2017   HGBA1C 5.1 06/22/2017   Lab Results  Component Value Date   INSULIN 8.2 01/27/2019   INSULIN 8.9 09/07/2018   INSULIN 6.1 03/15/2018   INSULIN 8.8 11/02/2017   INSULIN 8.0 06/22/2017   Lab Results  Component Value Date   TSH 3.490 01/27/2019   Lab Results  Component Value Date   CHOL 166 01/27/2019   HDL 43 01/27/2019   LDLCALC 103 (H) 01/27/2019   TRIG 109 01/27/2019   Lab Results  Component Value Date   WBC 4.7 06/22/2017   HGB 15.5 06/22/2017   HCT 46.5 06/22/2017   MCV 84 06/22/2017   PLT 173 01/09/2017   No results found for: IRON, TIBC, FERRITIN  Attestation Statements:   Reviewed by clinician on day of visit: allergies, medications, problem list, medical history, surgical history, family history, social history, and previous encounter notes.   Wilhemena Durie, am acting as transcriptionist for [employer], PA-C.  I have reviewed the above documentation for accuracy and completeness, and I agree with the above. Abby Potash, PA-C

## 2019-06-10 ENCOUNTER — Other Ambulatory Visit (INDEPENDENT_AMBULATORY_CARE_PROVIDER_SITE_OTHER): Payer: Self-pay | Admitting: Physician Assistant

## 2019-06-10 DIAGNOSIS — E038 Other specified hypothyroidism: Secondary | ICD-10-CM

## 2019-06-20 ENCOUNTER — Other Ambulatory Visit: Payer: Self-pay | Admitting: Neurology

## 2019-06-20 MED ORDER — METHYLPHENIDATE HCL ER (XR) 30 MG PO CP24: 30.0000 mg | ORAL_CAPSULE | Freq: Every day | ORAL | 0 refills | Status: DC

## 2019-06-20 NOTE — Addendum Note (Signed)
Addended by: Ann Maki on: 06/20/2019 01:37 PM   Modules accepted: Orders

## 2019-06-20 NOTE — Telephone Encounter (Signed)
Patient calling for his refill on -   Methylphenidate HCl ER, XR, 30 MG CP24 [facility] .

## 2019-06-20 NOTE — Telephone Encounter (Signed)
[place] drug registry has been verified. Last refill was 05/16/2019 # 30 for a 30 day supply. Pt has been compliant with f/u's. Ok to refill?

## 2019-07-04 ENCOUNTER — Encounter (INDEPENDENT_AMBULATORY_CARE_PROVIDER_SITE_OTHER): Payer: Self-pay | Admitting: Physician Assistant

## 2019-07-04 ENCOUNTER — Ambulatory Visit (INDEPENDENT_AMBULATORY_CARE_PROVIDER_SITE_OTHER): Payer: Managed Care, Other (non HMO) | Admitting: Physician Assistant

## 2019-07-04 ENCOUNTER — Other Ambulatory Visit: Payer: Self-pay

## 2019-07-04 VITALS — BP 146/80 | HR 57 | Temp 97.9°F | Ht 72.0 in | Wt 229.0 lb

## 2019-07-04 DIAGNOSIS — E7849 Other hyperlipidemia: Secondary | ICD-10-CM

## 2019-07-04 DIAGNOSIS — E559 Vitamin D deficiency, unspecified: Secondary | ICD-10-CM | POA: Diagnosis not present

## 2019-07-04 DIAGNOSIS — R739 Hyperglycemia, unspecified: Secondary | ICD-10-CM | POA: Diagnosis not present

## 2019-07-04 DIAGNOSIS — Z9189 Other specified personal risk factors, not elsewhere classified: Secondary | ICD-10-CM

## 2019-07-04 DIAGNOSIS — Z6831 Body mass index (BMI) 31.0-31.9, adult: Secondary | ICD-10-CM

## 2019-07-04 DIAGNOSIS — E669 Obesity, unspecified: Secondary | ICD-10-CM

## 2019-07-04 MED ORDER — VITAMIN D (ERGOCALCIFEROL) 1.25 MG (50000 UNIT) PO CAPS: 50000.0000 [IU] | ORAL_CAPSULE | ORAL | 0 refills | Status: DC

## 2019-07-04 NOTE — Progress Notes (Signed)
Chief Complaint:   OBESITY [name] is here to discuss his progress with his obesity treatment plan along with follow-up of his obesity related diagnoses. [name] is on the Category 3 Plan + 300 calories and states he is following his eating plan approximately 80% of the time. [name] states he is doing cardio/weightlifting 60 minutes 4 times per week.  Today's visit was #: 76 Starting weight: 253 lbs Starting date: 02/16/2017 Today's weight: 229 lbs Today's date: 07/04/2019 Total lbs lost to date: 24 Total lbs lost since last in-office visit: 0  Interim History: [name] states that he quit his job, is now working at [employer], and is much happier. He reports that he was off plan on Father's Day.  Subjective:   Hyperglycemia. [name] has a history of some elevated blood glucose readings without a diagnosis of diabetes. He denies polyphagia. He is on no medication.  Vitamin D deficiency. [name] is on prescription Vitamin D. No nausea, vomiting, or muscle weakness. Last Vitamin D was 44.2 on 01/27/2019.  Other hyperlipidemia. [name] is on no medication. No chest pain.   Lab Results  Component Value Date   CHOL 166 01/27/2019   HDL 43 01/27/2019   LDLCALC 103 (H) 01/27/2019   TRIG 109 01/27/2019   Lab Results  Component Value Date   ALT 46 (H) 01/27/2019   AST 22 01/27/2019   ALKPHOS 51 01/27/2019   BILITOT 0.8 01/27/2019   The 10-year ASCVD risk score Mikey Bussing DC Jr., et al., 2013) is: 5.9%   Values used to calculate the score:     Age: 54 years     Sex: Male     Is Non-Hispanic African American: No     Diabetic: No     Tobacco smoker: No     Systolic Blood Pressure: 673 mmHg     Is BP treated: No     HDL Cholesterol: 43 mg/dL     Total Cholesterol: 166 mg/dL  At risk for diabetes mellitus. [name] is at higher than average risk for developing diabetes due to his obesity.   Assessment/Plan:   Hyperglycemia. Fasting labs will be obtained and results with  be discussed with [name] in 2 weeks at his follow up visit. In the meanwhile [name] was started on a lower simple carbohydrate diet and will work on weight loss efforts. Hemoglobin A1c, Insulin, random labs ordered today.  Vitamin D deficiency. Low Vitamin D level contributes to fatigue and are associated with obesity, breast, and colon cancer. He was given a refill on his Vitamin D, Ergocalciferol, (DRISDOL) 1.25 MG (50000 UNIT) CAPS capsule every week #4 with 0 refills and VITAMIN D 25 Hydroxy (Vit-D Deficiency, Fractures) level was ordered today.  Other hyperlipidemia. Cardiovascular risk and specific lipid/LDL goals reviewed.  We discussed several lifestyle modifications today and [name] will continue to work on diet, exercise and weight loss efforts. Orders and follow up as documented in patient record. Comprehensive metabolic panel, Hemoglobin A1c, Insulin, random, Lipid Panel With LDL/HDL Ratio labs ordered today.  Counseling Intensive lifestyle modifications are the first line treatment for this issue. . Dietary changes: Increase soluble fiber. Decrease simple carbohydrates. . Exercise changes: Moderate to vigorous-intensity aerobic activity 150 minutes per week if tolerated. . Lipid-lowering medications: see documented in medical record.   At risk for diabetes mellitus. [name] was given approximately 15 minutes of diabetes education and counseling today. We discussed intensive lifestyle modifications today with an emphasis on weight loss as well as  increasing exercise and decreasing simple carbohydrates in his diet. We also reviewed medication options with an emphasis on risk versus benefit of those discussed.   Repetitive spaced learning was employed today to elicit superior memory formation and behavioral change.  Class 1 obesity with serious comorbidity and body mass index (BMI) of 31.0 to 31.9 in adult, unspecified obesity type.  [name] is currently in the action stage of change. As  such, his goal is to continue with weight loss efforts. He has agreed to the Category 3 Plan + 300 calories.   Exercise goals: For substantial health benefits, adults should do at least 150 minutes (2 hours and 30 minutes) a week of moderate-intensity, or 75 minutes (1 hour and 15 minutes) a week of vigorous-intensity aerobic physical activity, or an equivalent combination of moderate- and vigorous-intensity aerobic activity. Aerobic activity should be performed in episodes of at least 10 minutes, and preferably, it should be spread throughout the week.  Behavioral modification strategies: meal planning and cooking strategies and avoiding temptations.  [name] has agreed to follow-up with our clinic in 3-4 weeks. He was informed of the importance of frequent follow-up visits to maximize his success with intensive lifestyle modifications for his multiple health conditions.   [name] was informed we would discuss his lab results at his next visit unless there is a critical issue that needs to be addressed sooner. [name] agreed to keep his next visit at the agreed upon time to discuss these results.  Objective:   Blood pressure (!) 146/80, pulse (!) 57, temperature 97.9 F (36.6 C), temperature source Oral, height 6' (1.829 m), weight 229 lb (103.9 kg), SpO2 98 %. Body mass index is 31.06 kg/m.  General: Cooperative, alert, well developed, in no acute distress. HEENT: Conjunctivae and lids unremarkable. Cardiovascular: Regular rhythm.  Lungs: Normal work of breathing. Neurologic: No focal deficits.   Lab Results  Component Value Date   CREATININE 0.83 01/27/2019   BUN 20 01/27/2019   NA 142 01/27/2019   K 4.5 01/27/2019   CL 103 01/27/2019   CO2 24 01/27/2019   Lab Results  Component Value Date   ALT 46 (H) 01/27/2019   AST 22 01/27/2019   ALKPHOS 51 01/27/2019   BILITOT 0.8 01/27/2019   Lab Results  Component Value Date   HGBA1C 5.2 01/27/2019   HGBA1C 5.2 09/07/2018   HGBA1C  5.1 03/15/2018   HGBA1C 5.2 11/02/2017   HGBA1C 5.1 06/22/2017   Lab Results  Component Value Date   INSULIN 8.2 01/27/2019   INSULIN 8.9 09/07/2018   INSULIN 6.1 03/15/2018   INSULIN 8.8 11/02/2017   INSULIN 8.0 06/22/2017   Lab Results  Component Value Date   TSH 3.490 01/27/2019   Lab Results  Component Value Date   CHOL 166 01/27/2019   HDL 43 01/27/2019   LDLCALC 103 (H) 01/27/2019   TRIG 109 01/27/2019   Lab Results  Component Value Date   WBC 4.7 06/22/2017   HGB 15.5 06/22/2017   HCT 46.5 06/22/2017   MCV 84 06/22/2017   PLT 173 01/09/2017   No results found for: IRON, TIBC, FERRITIN  Attestation Statements:   Reviewed by clinician on day of visit: allergies, medications, problem list, medical history, surgical history, family history, social history, and previous encounter notes.  IMarianna Payment, am acting as transcriptionist for Alois Cliche, PA-C   I have reviewed the above documentation for accuracy and completeness, and I agree with the above. Alois Cliche, PA-C

## 2019-07-05 LAB — COMPREHENSIVE METABOLIC PANEL
ALT: 31 IU/L (ref 0–44)
AST: 19 IU/L (ref 0–40)
Albumin/Globulin Ratio: 1.8 (ref 1.2–2.2)
Albumin: 4.6 g/dL (ref 3.8–4.9)
Alkaline Phosphatase: 56 IU/L (ref 48–121)
BUN/Creatinine Ratio: 20 (ref 9–20)
BUN: 17 mg/dL (ref 6–24)
Bilirubin Total: 0.6 mg/dL (ref 0.0–1.2)
CO2: 22 mmol/L (ref 20–29)
Calcium: 9.1 mg/dL (ref 8.7–10.2)
Chloride: 105 mmol/L (ref 96–106)
Creatinine, Ser: 0.86 mg/dL (ref 0.76–1.27)
GFR calc Af Amer: 114 mL/min/{1.73_m2} (ref >59)
GFR calc non Af Amer: 98 mL/min/{1.73_m2} (ref >59)
Globulin, Total: 2.6 g/dL (ref 1.5–4.5)
Glucose: 92 mg/dL (ref 65–99)
Potassium: 4.3 mmol/L (ref 3.5–5.2)
Sodium: 141 mmol/L (ref 134–144)
Total Protein: 7.2 g/dL (ref 6.0–8.5)

## 2019-07-05 LAB — HEMOGLOBIN A1C
Est. average glucose Bld gHb Est-mCnc: 100 mg/dL
Hgb A1c MFr Bld: 5.1 % (ref 4.8–5.6)

## 2019-07-05 LAB — INSULIN, RANDOM: INSULIN: 6.4 u[IU]/mL (ref 2.6–24.9)

## 2019-07-05 LAB — LIPID PANEL WITH LDL/HDL RATIO
Cholesterol, Total: 160 mg/dL (ref 100–199)
HDL: 55 mg/dL (ref >39)
LDL Chol Calc (NIH): 87 mg/dL (ref 0–99)
LDL/HDL Ratio: 1.6 ratio (ref 0.0–3.6)
Triglycerides: 97 mg/dL (ref 0–149)
VLDL Cholesterol Cal: 18 mg/dL (ref 5–40)

## 2019-07-05 LAB — VITAMIN D 25 HYDROXY (VIT D DEFICIENCY, FRACTURES): Vit D, 25-Hydroxy: 46.9 ng/mL (ref 30.0–100.0)

## 2019-07-23 ENCOUNTER — Other Ambulatory Visit (INDEPENDENT_AMBULATORY_CARE_PROVIDER_SITE_OTHER): Payer: Self-pay | Admitting: Physician Assistant

## 2019-07-23 DIAGNOSIS — E038 Other specified hypothyroidism: Secondary | ICD-10-CM

## 2019-07-25 ENCOUNTER — Other Ambulatory Visit (INDEPENDENT_AMBULATORY_CARE_PROVIDER_SITE_OTHER): Payer: Self-pay | Admitting: Physician Assistant

## 2019-07-25 ENCOUNTER — Encounter (INDEPENDENT_AMBULATORY_CARE_PROVIDER_SITE_OTHER): Payer: Self-pay

## 2019-07-25 DIAGNOSIS — E038 Other specified hypothyroidism: Secondary | ICD-10-CM

## 2019-08-01 ENCOUNTER — Ambulatory Visit (INDEPENDENT_AMBULATORY_CARE_PROVIDER_SITE_OTHER): Payer: Self-pay | Admitting: Physician Assistant

## 2019-08-02 ENCOUNTER — Other Ambulatory Visit (INDEPENDENT_AMBULATORY_CARE_PROVIDER_SITE_OTHER): Payer: Self-pay

## 2019-08-02 ENCOUNTER — Encounter (INDEPENDENT_AMBULATORY_CARE_PROVIDER_SITE_OTHER): Payer: Self-pay | Admitting: Physician Assistant

## 2019-08-02 DIAGNOSIS — E559 Vitamin D deficiency, unspecified: Secondary | ICD-10-CM

## 2019-08-02 DIAGNOSIS — E038 Other specified hypothyroidism: Secondary | ICD-10-CM

## 2019-08-02 MED ORDER — VITAMIN D (ERGOCALCIFEROL) 1.25 MG (50000 UNIT) PO CAPS: 50000.0000 [IU] | ORAL_CAPSULE | ORAL | 0 refills | Status: DC

## 2019-08-02 MED ORDER — LEVOTHYROXINE SODIUM 75 MCG PO TABS: 75.0000 ug | ORAL_TABLET | Freq: Every day | ORAL | 0 refills | Status: DC

## 2019-08-08 ENCOUNTER — Ambulatory Visit (INDEPENDENT_AMBULATORY_CARE_PROVIDER_SITE_OTHER): Payer: 59 | Admitting: Physician Assistant

## 2019-08-08 ENCOUNTER — Other Ambulatory Visit: Payer: Self-pay

## 2019-08-08 ENCOUNTER — Encounter (INDEPENDENT_AMBULATORY_CARE_PROVIDER_SITE_OTHER): Payer: Self-pay | Admitting: Physician Assistant

## 2019-08-08 VITALS — BP 138/86 | HR 56 | Temp 97.4°F | Ht 72.0 in | Wt 227.0 lb

## 2019-08-08 DIAGNOSIS — Z6830 Body mass index (BMI) 30.0-30.9, adult: Secondary | ICD-10-CM

## 2019-08-08 DIAGNOSIS — E038 Other specified hypothyroidism: Secondary | ICD-10-CM | POA: Diagnosis not present

## 2019-08-08 DIAGNOSIS — Z9189 Other specified personal risk factors, not elsewhere classified: Secondary | ICD-10-CM

## 2019-08-08 DIAGNOSIS — E559 Vitamin D deficiency, unspecified: Secondary | ICD-10-CM | POA: Diagnosis not present

## 2019-08-08 DIAGNOSIS — E669 Obesity, unspecified: Secondary | ICD-10-CM | POA: Diagnosis not present

## 2019-08-08 MED ORDER — VITAMIN D (ERGOCALCIFEROL) 1.25 MG (50000 UNIT) PO CAPS: 50000.0000 [IU] | ORAL_CAPSULE | ORAL | 1 refills | Status: DC

## 2019-08-08 MED ORDER — LEVOTHYROXINE SODIUM 75 MCG PO TABS: 75.0000 ug | ORAL_TABLET | Freq: Every day | ORAL | 1 refills | Status: DC

## 2019-08-08 NOTE — Progress Notes (Signed)
Chief Complaint:   OBESITY [name] is here to discuss his progress with his obesity treatment plan along with follow-up of his obesity related diagnoses. [name] is on the Category 3 Plan + 300 calories and states he is following his eating plan approximately 85% of the time. [name] states he is going to the gym 60+ minutes 3-4 times per week.  Today's visit was #: 32 Starting weight: 253 lbs Starting date: 02/16/2017 Today's weight: 227 lbs Today's date: 08/08/2019 Total lbs lost to date: 26 Total lbs lost since last in-office visit: 2  Interim History: [name] reports that he is in a "maintenance stage" and doing well. He is eating a lot of protein and working out consistently.  Subjective:   Vitamin D deficiency. [name] is on prescription Vitamin D supplementation. No nausea, vomiting, or muscle weakness.    Ref. Range 07/04/2019 13:16  Vitamin D, 25-Hydroxy Latest Ref Range: 30.0 - 100.0 ng/mL 46.9   Other specified hypothyroidism. [name] is on levothyroxine. No hair loss or excessive fatigue.   Lab Results  Component Value Date   TSH 3.490 01/27/2019   At risk for heart disease. [name] is at a higher than average risk for cardiovascular disease due to obesity.   Assessment/Plan:   Vitamin D deficiency. Low Vitamin D level contributes to fatigue and are associated with obesity, breast, and colon cancer. He was given a refill on his Vitamin D, Ergocalciferol, (DRISDOL) 1.25 MG (50000 UNIT) CAPS capsule every week #4 with 1 refill and will follow-up for routine testing of Vitamin D, at least 2-3 times per year to avoid over-replacement.   Other specified hypothyroidism. Patient with long-standing hypothyroidism, on levothyroxine therapy. He appears euthyroid. Orders and follow up as documented in patient record. Refill was given for levothyroxine (SYNTHROID) 75 MCG tablet #30 with 1 refill.  Counseling . Good thyroid control is important for overall health.  Supratherapeutic thyroid levels are dangerous and will not improve weight loss results. . The correct way to take levothyroxine is fasting, with water, separated by at least 30 minutes from breakfast, and separated by more than 4 hours from calcium, iron, multivitamins, acid reflux medications (PPIs).    At risk for heart disease. [name] was given approximately 15 minutes of coronary artery disease prevention counseling today. He is 54 y.o. male and has risk factors for heart disease including obesity. We discussed intensive lifestyle modifications today with an emphasis on specific weight loss instructions and strategies.   Repetitive spaced learning was employed today to elicit superior memory formation and behavioral change.  Class 1 obesity with serious comorbidity and body mass index (BMI) of 30.0 to 30.9 in adult, unspecified obesity type.  [name] is currently in the action stage of change. As such, his goal is to continue with weight loss efforts. He has agreed to the Category 3 Plan + 300 calories.   Exercise goals: All adults should avoid inactivity. Some physical activity is better than none, and adults who participate in any amount of physical activity gain some health benefits.  Behavioral modification strategies: meal planning and cooking strategies and keeping healthy foods in the home.  [name] has agreed to follow-up with our clinic in 6 weeks. He was informed of the importance of frequent follow-up visits to maximize his success with intensive lifestyle modifications for his multiple health conditions.   Objective:   Blood pressure (!) 138/86, pulse 56, temperature (!) 97.4 F (36.3 C), temperature source Oral, height 6' (1.829 m),  weight (!) 227 lb (103 kg), SpO2 96 %. Body mass index is 30.79 kg/m.  General: Cooperative, alert, well developed, in no acute distress. HEENT: Conjunctivae and lids unremarkable. Cardiovascular: Regular rhythm.  Lungs: Normal work of  breathing. Neurologic: No focal deficits.   Lab Results  Component Value Date   CREATININE 0.86 07/04/2019   BUN 17 07/04/2019   NA 141 07/04/2019   K 4.3 07/04/2019   CL 105 07/04/2019   CO2 22 07/04/2019   Lab Results  Component Value Date   ALT 31 07/04/2019   AST 19 07/04/2019   ALKPHOS 56 07/04/2019   BILITOT 0.6 07/04/2019   Lab Results  Component Value Date   HGBA1C 5.1 07/04/2019   HGBA1C 5.2 01/27/2019   HGBA1C 5.2 09/07/2018   HGBA1C 5.1 03/15/2018   HGBA1C 5.2 11/02/2017   Lab Results  Component Value Date   INSULIN 6.4 07/04/2019   INSULIN 8.2 01/27/2019   INSULIN 8.9 09/07/2018   INSULIN 6.1 03/15/2018   INSULIN 8.8 11/02/2017   Lab Results  Component Value Date   TSH 3.490 01/27/2019   Lab Results  Component Value Date   CHOL 160 07/04/2019   HDL 55 07/04/2019   LDLCALC 87 07/04/2019   TRIG 97 07/04/2019   Lab Results  Component Value Date   WBC 4.7 06/22/2017   HGB 15.5 06/22/2017   HCT 46.5 06/22/2017   MCV 84 06/22/2017   PLT 173 01/09/2017   No results found for: IRON, TIBC, FERRITIN  Attestation Statements:   Reviewed by clinician on day of visit: allergies, medications, problem list, medical history, surgical history, family history, social history, and previous encounter notes.  IMarianna Payment, am acting as transcriptionist for Alois Cliche, PA-C   I have reviewed the above documentation for accuracy and completeness, and I agree with the above. Alois Cliche, PA-C

## 2019-08-15 ENCOUNTER — Ambulatory Visit (INDEPENDENT_AMBULATORY_CARE_PROVIDER_SITE_OTHER): Payer: Self-pay | Admitting: Physician Assistant

## 2019-08-30 ENCOUNTER — Encounter (INDEPENDENT_AMBULATORY_CARE_PROVIDER_SITE_OTHER): Payer: Self-pay | Admitting: Physician Assistant

## 2019-08-31 ENCOUNTER — Other Ambulatory Visit (INDEPENDENT_AMBULATORY_CARE_PROVIDER_SITE_OTHER): Payer: Self-pay | Admitting: *Deleted

## 2019-08-31 DIAGNOSIS — E559 Vitamin D deficiency, unspecified: Secondary | ICD-10-CM

## 2019-08-31 MED ORDER — VITAMIN D (ERGOCALCIFEROL) 1.25 MG (50000 UNIT) PO CAPS: 50000.0000 [IU] | ORAL_CAPSULE | ORAL | 1 refills | Status: DC

## 2019-09-04 ENCOUNTER — Other Ambulatory Visit (INDEPENDENT_AMBULATORY_CARE_PROVIDER_SITE_OTHER): Payer: Self-pay | Admitting: Physician Assistant

## 2019-09-04 DIAGNOSIS — E038 Other specified hypothyroidism: Secondary | ICD-10-CM

## 2019-09-21 ENCOUNTER — Encounter (INDEPENDENT_AMBULATORY_CARE_PROVIDER_SITE_OTHER): Payer: Self-pay | Admitting: Physician Assistant

## 2019-09-21 ENCOUNTER — Ambulatory Visit (INDEPENDENT_AMBULATORY_CARE_PROVIDER_SITE_OTHER): Payer: 59 | Admitting: Physician Assistant

## 2019-09-21 ENCOUNTER — Other Ambulatory Visit: Payer: Self-pay

## 2019-09-21 VITALS — BP 133/80 | HR 57 | Temp 97.9°F | Ht 72.0 in | Wt 230.0 lb

## 2019-09-21 DIAGNOSIS — Z6831 Body mass index (BMI) 31.0-31.9, adult: Secondary | ICD-10-CM

## 2019-09-21 DIAGNOSIS — E559 Vitamin D deficiency, unspecified: Secondary | ICD-10-CM | POA: Diagnosis not present

## 2019-09-21 DIAGNOSIS — E038 Other specified hypothyroidism: Secondary | ICD-10-CM | POA: Diagnosis not present

## 2019-09-21 DIAGNOSIS — Z9189 Other specified personal risk factors, not elsewhere classified: Secondary | ICD-10-CM

## 2019-09-21 DIAGNOSIS — E8881 Metabolic syndrome: Secondary | ICD-10-CM

## 2019-09-21 DIAGNOSIS — E669 Obesity, unspecified: Secondary | ICD-10-CM

## 2019-09-21 MED ORDER — LEVOTHYROXINE SODIUM 75 MCG PO TABS: 75.0000 ug | ORAL_TABLET | Freq: Every day | ORAL | 0 refills | Status: DC

## 2019-09-21 MED ORDER — VITAMIN D (ERGOCALCIFEROL) 1.25 MG (50000 UNIT) PO CAPS: 50000.0000 [IU] | ORAL_CAPSULE | ORAL | 0 refills | Status: DC

## 2019-09-22 NOTE — Progress Notes (Signed)
Chief Complaint:   OBESITY [name] is here to discuss his progress with his obesity treatment plan along with follow-up of his obesity related diagnoses. [name] is on the Category 3 Plan +300 calories and states he is following his eating plan approximately 80% of the time. [name] states he is lifting weights for 60 minutes 3-4 times per week.  Today's visit was #: 33 Starting weight: 253 lbs Starting date: 02/16/2017 Today's weight: 230 lbs Today's date: 09/21/2019 Total lbs lost to date: 23 lbs Total lbs lost since last in-office visit: 0  Interim History: Minor reports that he continues to do some excessive snacking on the foods that his family that is living with him is bringing into the house.  He states he has some excessive hunger, especially on workout days.  Medications to decrease hunger were discussed today.  Fasting labs next visit.  Subjective:   1. Vitamin D deficiency [name]'s Vitamin D level was 46.9 on 07/04/2019. He is currently taking prescription vitamin D 50,000 IU each week. He denies nausea, vomiting or muscle weakness.  She does not notice a difference in energy now taking vitamin D.  Last level not at goal.  2. Other specified hypothyroidism [name] is taking levothyroxine 75 mcg daily.  Last panel at goal.  Lab Results  Component Value Date   TSH 3.490 01/27/2019   3. Insulin resistance [name] has a diagnosis of insulin resistance based on his elevated fasting insulin level >5. He continues to work on diet and exercise to decrease his risk of diabetes.  No medications.  Reports polyphagia.  Lab Results  Component Value Date   INSULIN 6.4 07/04/2019   INSULIN 8.2 01/27/2019   INSULIN 8.9 09/07/2018   INSULIN 6.1 03/15/2018   INSULIN 8.8 11/02/2017   Lab Results  Component Value Date   HGBA1C 5.1 07/04/2019   4. At risk for diabetes mellitus [name] is at higher than average risk for developing diabetes due to his obesity.   Assessment/Plan:    1. Vitamin D deficiency Low Vitamin D level contributes to fatigue and are associated with obesity, breast, and colon cancer. He agrees to continue to take prescription Vitamin D @50 ,000 IU every week and will follow-up for routine testing of Vitamin D, at least 2-3 times per year to avoid over-replacement.  -Refill Vitamin D, Ergocalciferol, (DRISDOL) 1.25 MG (50000 UNIT) CAPS capsule; Take 1 capsule (50,000 Units total) by mouth every 7 (seven) days.  Dispense: 8 capsule; Refill: 0  2. Other specified hypothyroidism Patient with long-standing hypothyroidism, on levothyroxine therapy. He appears euthyroid. Orders and follow up as documented in patient record.  Counseling . Good thyroid control is important for overall health. Supratherapeutic thyroid levels are dangerous and will not improve weight loss results. . The correct way to take levothyroxine is fasting, with water, separated by at least 30 minutes from breakfast, and separated by more than 4 hours from calcium, iron, multivitamins, acid reflux medications (PPIs).   -Refill levothyroxine (SYNTHROID) 75 MCG tablet; Take 1 tablet (75 mcg total) by mouth daily before breakfast.  Dispense: 90 tablet; Refill: 0  3. Insulin resistance [name] will continue to work on weight loss, exercise, and decreasing simple carbohydrates to help decrease the risk of diabetes. [name] agreed to follow-up with as directed to closely monitor his progress.  Discussed medications to better control his appetite.   4. At risk for diabetes mellitus [name] was given approximately 15 minutes of diabetes education and counseling today. We  discussed intensive lifestyle modifications today with an emphasis on weight loss as well as increasing exercise and decreasing simple carbohydrates in his diet. We also reviewed medication options with an emphasis on risk versus benefit of those discussed.   Repetitive spaced learning was employed today to elicit superior  memory formation and behavioral change.  5. Class 1 obesity with serious comorbidity and body mass index (BMI) of 31.0 to 31.9 in adult, unspecified obesity type [name] is currently in the action stage of change. As such, his goal is to continue with weight loss efforts. He has agreed to the Category 4 Plan +4 ounces of meat protein.   Exercise goals: For substantial health benefits, adults should do at least 150 minutes (2 hours and 30 minutes) a week of moderate-intensity, or 75 minutes (1 hour and 15 minutes) a week of vigorous-intensity aerobic physical activity, or an equivalent combination of moderate- and vigorous-intensity aerobic activity. Aerobic activity should be performed in episodes of at least 10 minutes, and preferably, it should be spread throughout the week.  Behavioral modification strategies: increasing lean protein intake and meal planning and cooking strategies.  [name] has agreed to follow-up with our clinic in 6 weeks. He was informed of the importance of frequent follow-up visits to maximize his success with intensive lifestyle modifications for his multiple health conditions.   Objective:   Blood pressure 133/80, pulse (!) 57, temperature 97.9 F (36.6 C), temperature source Oral, height 6' (1.829 m), weight 230 lb (104.3 kg), SpO2 97 %. Body mass index is 31.19 kg/m.  General: Cooperative, alert, well developed, in no acute distress. HEENT: Conjunctivae and lids unremarkable. Cardiovascular: Regular rhythm.  Lungs: Normal work of breathing. Neurologic: No focal deficits.   Lab Results  Component Value Date   CREATININE 0.86 07/04/2019   BUN 17 07/04/2019   NA 141 07/04/2019   K 4.3 07/04/2019   CL 105 07/04/2019   CO2 22 07/04/2019   Lab Results  Component Value Date   ALT 31 07/04/2019   AST 19 07/04/2019   ALKPHOS 56 07/04/2019   BILITOT 0.6 07/04/2019   Lab Results  Component Value Date   HGBA1C 5.1 07/04/2019   HGBA1C 5.2 01/27/2019   HGBA1C  5.2 09/07/2018   HGBA1C 5.1 03/15/2018   HGBA1C 5.2 11/02/2017   Lab Results  Component Value Date   INSULIN 6.4 07/04/2019   INSULIN 8.2 01/27/2019   INSULIN 8.9 09/07/2018   INSULIN 6.1 03/15/2018   INSULIN 8.8 11/02/2017   Lab Results  Component Value Date   TSH 3.490 01/27/2019   Lab Results  Component Value Date   CHOL 160 07/04/2019   HDL 55 07/04/2019   LDLCALC 87 07/04/2019   TRIG 97 07/04/2019   Lab Results  Component Value Date   WBC 4.7 06/22/2017   HGB 15.5 06/22/2017   HCT 46.5 06/22/2017   MCV 84 06/22/2017   PLT 173 01/09/2017   Attestation Statements:   Reviewed by clinician on day of visit: allergies, medications, problem list, medical history, surgical history, family history, social history, and previous encounter notes.  I, Insurance claims handler, CMA, am acting as transcriptionist for Alois Cliche, PA-C  I have reviewed the above documentation for accuracy and completeness, and I agree with the above. Alois Cliche, PA-C

## 2019-10-10 ENCOUNTER — Ambulatory Visit: Payer: Managed Care, Other (non HMO) | Admitting: Neurology

## 2019-11-02 ENCOUNTER — Ambulatory Visit (INDEPENDENT_AMBULATORY_CARE_PROVIDER_SITE_OTHER): Payer: 59 | Admitting: Physician Assistant

## 2019-11-05 ENCOUNTER — Other Ambulatory Visit (INDEPENDENT_AMBULATORY_CARE_PROVIDER_SITE_OTHER): Payer: Self-pay | Admitting: Physician Assistant

## 2019-11-05 DIAGNOSIS — E559 Vitamin D deficiency, unspecified: Secondary | ICD-10-CM

## 2019-11-21 ENCOUNTER — Ambulatory Visit (INDEPENDENT_AMBULATORY_CARE_PROVIDER_SITE_OTHER): Payer: 59 | Admitting: Physician Assistant

## 2019-11-21 ENCOUNTER — Encounter (INDEPENDENT_AMBULATORY_CARE_PROVIDER_SITE_OTHER): Payer: Self-pay | Admitting: Physician Assistant

## 2019-11-21 ENCOUNTER — Other Ambulatory Visit: Payer: Self-pay

## 2019-11-21 VITALS — BP 130/78 | HR 80 | Temp 97.9°F | Ht 72.0 in | Wt 231.0 lb

## 2019-11-21 DIAGNOSIS — E8881 Metabolic syndrome: Secondary | ICD-10-CM | POA: Diagnosis not present

## 2019-11-21 DIAGNOSIS — E669 Obesity, unspecified: Secondary | ICD-10-CM | POA: Diagnosis not present

## 2019-11-21 DIAGNOSIS — E559 Vitamin D deficiency, unspecified: Secondary | ICD-10-CM

## 2019-11-21 DIAGNOSIS — Z6831 Body mass index (BMI) 31.0-31.9, adult: Secondary | ICD-10-CM

## 2019-11-21 DIAGNOSIS — Z9189 Other specified personal risk factors, not elsewhere classified: Secondary | ICD-10-CM

## 2019-11-22 MED ORDER — VITAMIN D (ERGOCALCIFEROL) 1.25 MG (50000 UNIT) PO CAPS: 50000.0000 [IU] | ORAL_CAPSULE | ORAL | 0 refills | Status: DC

## 2019-11-22 NOTE — Progress Notes (Signed)
Chief Complaint:   OBESITY [name] is here to discuss his progress with his obesity treatment plan along with follow-up of his obesity related diagnoses. [name] is on the Category 3 Plan and states he is following his eating plan approximately 75% of the time. [name] states he is using the ELEY bike 20 minutes 3 times per week.  Today's visit was #: 34 Starting weight: 253 lbs Starting date: 02/16/2017 Today's weight: 231 lbs Today's date: 11/21/2019 Total lbs lost to date: 22 Total lbs lost since last in-office visit: 0  Interim History: [name] reports that his weight was down to 225 lbs, but has recently gained a few pounds back. He is not following the plan closely and reports drinking beer in excess of his snack calories. GLP-1's were discussed today.  Subjective:   Vitamin D deficiency. [name] is on prescription Vitamin D weekly, which he is tolerating well. He is due for labs.   Ref. Range 07/04/2019 13:16  Vitamin D, 25-Hydroxy Latest Ref Range: 30.0 - 100.0 ng/mL 46.9   Insulin resistance. [name] has a diagnosis of insulin resistance based on his elevated fasting insulin level >5. He continues to work on diet and exercise to decrease his risk of diabetes. [name] is on no medication and reports polyphagia.  Lab Results  Component Value Date   INSULIN 6.4 07/04/2019   INSULIN 8.2 01/27/2019   INSULIN 8.9 09/07/2018   INSULIN 6.1 03/15/2018   INSULIN 8.8 11/02/2017   Lab Results  Component Value Date   HGBA1C 5.1 07/04/2019   At risk for diabetes mellitus. [name] is at higher than average risk for developing diabetes due to obesity.   Assessment/Plan:   Vitamin D deficiency. Low Vitamin D level contributes to fatigue and are associated with obesity, breast, and colon cancer. He was given a refill on his Vitamin D, Ergocalciferol, (DRISDOL) 1.25 MG (50000 UNIT) CAPS capsule every week #8 with 0 refills and will follow-up for routine testing of Vitamin D, at  least 2-3 times per year to avoid over-replacement.   Insulin resistance. [name] will continue to work on weight loss, exercise, and decreasing simple carbohydrates to help decrease the risk of diabetes. [name] agreed to follow-up with [name] as directed to closely monitor his progress. We discussed GLP-1's in detail today.  At risk for diabetes mellitus. [name] was given approximately 15 minutes of diabetes education and counseling today. We discussed intensive lifestyle modifications today with an emphasis on weight loss as well as increasing exercise and decreasing simple carbohydrates in his diet. We also reviewed medication options with an emphasis on risk versus benefit of those discussed.   Repetitive spaced learning was employed today to elicit superior memory formation and behavioral change.  Class 1 obesity with serious comorbidity and body mass index (BMI) of 31.0 to 31.9 in adult, unspecified obesity type.  [name] is currently in the action stage of change. As such, his goal is to continue with weight loss efforts. He has agreed to change plans and will now follow the Category 4 Plan.   Exercise goals: For substantial health benefits, adults should do at least 150 minutes (2 hours and 30 minutes) a week of moderate-intensity, or 75 minutes (1 hour and 15 minutes) a week of vigorous-intensity aerobic physical activity, or an equivalent combination of moderate- and vigorous-intensity aerobic activity. Aerobic activity should be performed in episodes of at least 10 minutes, and preferably, it should be spread throughout the week.  Behavioral modification strategies:  increasing lean protein intake and planning for success.  [name] has agreed to follow-up with our clinic in 6 weeks. He was informed of the importance of frequent follow-up visits to maximize his success with intensive lifestyle modifications for his multiple health conditions.   Objective:   Blood pressure 130/78, pulse 80,  temperature 97.9 F (36.6 C), height 6' (1.829 m), weight 231 lb (104.8 kg), SpO2 98 %. Body mass index is 31.33 kg/m.  General: Cooperative, alert, well developed, in no acute distress. HEENT: Conjunctivae and lids unremarkable. Cardiovascular: Regular rhythm.  Lungs: Normal work of breathing. Neurologic: No focal deficits.   Lab Results  Component Value Date   CREATININE 0.86 07/04/2019   BUN 17 07/04/2019   NA 141 07/04/2019   K 4.3 07/04/2019   CL 105 07/04/2019   CO2 22 07/04/2019   Lab Results  Component Value Date   ALT 31 07/04/2019   AST 19 07/04/2019   ALKPHOS 56 07/04/2019   BILITOT 0.6 07/04/2019   Lab Results  Component Value Date   HGBA1C 5.1 07/04/2019   HGBA1C 5.2 01/27/2019   HGBA1C 5.2 09/07/2018   HGBA1C 5.1 03/15/2018   HGBA1C 5.2 11/02/2017   Lab Results  Component Value Date   INSULIN 6.4 07/04/2019   INSULIN 8.2 01/27/2019   INSULIN 8.9 09/07/2018   INSULIN 6.1 03/15/2018   INSULIN 8.8 11/02/2017   Lab Results  Component Value Date   TSH 3.490 01/27/2019   Lab Results  Component Value Date   CHOL 160 07/04/2019   HDL 55 07/04/2019   LDLCALC 87 07/04/2019   TRIG 97 07/04/2019   Lab Results  Component Value Date   WBC 4.7 06/22/2017   HGB 15.5 06/22/2017   HCT 46.5 06/22/2017   MCV 84 06/22/2017   PLT 173 01/09/2017   No results found for: IRON, TIBC, FERRITIN  Attestation Statements:   Reviewed by clinician on day of visit: allergies, medications, problem list, medical history, surgical history, family history, social history, and previous encounter notes.  IMarianna Payment, am acting as transcriptionist for Alois Cliche, PA-C   I have reviewed the above documentation for accuracy and completeness, and I agree with the above. Alois Cliche, PA-C

## 2019-12-02 ENCOUNTER — Encounter (INDEPENDENT_AMBULATORY_CARE_PROVIDER_SITE_OTHER): Payer: Self-pay | Admitting: Physician Assistant

## 2019-12-05 NOTE — Telephone Encounter (Signed)
Last OV with Tracey 

## 2019-12-07 NOTE — Telephone Encounter (Signed)
Please refill this 

## 2019-12-15 ENCOUNTER — Other Ambulatory Visit (INDEPENDENT_AMBULATORY_CARE_PROVIDER_SITE_OTHER): Payer: Self-pay | Admitting: Physician Assistant

## 2019-12-15 DIAGNOSIS — E038 Other specified hypothyroidism: Secondary | ICD-10-CM

## 2019-12-15 NOTE — Telephone Encounter (Signed)
Would you like for me to refill his rx?

## 2019-12-15 NOTE — Telephone Encounter (Signed)
This patient was last seen by Tracey Aguilar, PA-C, and currently has an upcoming appt scheduled on 01/02/20 with her.  

## 2019-12-20 NOTE — Telephone Encounter (Signed)
Yes ok to send.  

## 2020-01-02 ENCOUNTER — Other Ambulatory Visit: Payer: Self-pay

## 2020-01-02 ENCOUNTER — Ambulatory Visit (INDEPENDENT_AMBULATORY_CARE_PROVIDER_SITE_OTHER): Payer: 59 | Admitting: Physician Assistant

## 2020-01-02 ENCOUNTER — Encounter (INDEPENDENT_AMBULATORY_CARE_PROVIDER_SITE_OTHER): Payer: Self-pay | Admitting: Physician Assistant

## 2020-01-02 VITALS — BP 135/77 | HR 60 | Temp 97.8°F | Ht 72.0 in | Wt 234.0 lb

## 2020-01-02 DIAGNOSIS — Z9189 Other specified personal risk factors, not elsewhere classified: Secondary | ICD-10-CM | POA: Diagnosis not present

## 2020-01-02 DIAGNOSIS — Z6831 Body mass index (BMI) 31.0-31.9, adult: Secondary | ICD-10-CM

## 2020-01-02 DIAGNOSIS — E559 Vitamin D deficiency, unspecified: Secondary | ICD-10-CM

## 2020-01-02 DIAGNOSIS — R739 Hyperglycemia, unspecified: Secondary | ICD-10-CM | POA: Diagnosis not present

## 2020-01-02 DIAGNOSIS — E7849 Other hyperlipidemia: Secondary | ICD-10-CM | POA: Diagnosis not present

## 2020-01-02 DIAGNOSIS — E038 Other specified hypothyroidism: Secondary | ICD-10-CM | POA: Diagnosis not present

## 2020-01-02 DIAGNOSIS — E669 Obesity, unspecified: Secondary | ICD-10-CM

## 2020-01-02 MED ORDER — LEVOTHYROXINE SODIUM 75 MCG PO TABS: 75.0000 ug | ORAL_TABLET | Freq: Every day | ORAL | 0 refills | Status: AC

## 2020-01-02 MED ORDER — WEGOVY 0.25 MG/0.5ML ~~LOC~~ SOAJ: 0.2500 mg | SUBCUTANEOUS | 0 refills | Status: AC

## 2020-01-02 MED ORDER — VITAMIN D (ERGOCALCIFEROL) 1.25 MG (50000 UNIT) PO CAPS: 50000.0000 [IU] | ORAL_CAPSULE | ORAL | 0 refills | Status: AC

## 2020-01-02 NOTE — Progress Notes (Signed)
Chief Complaint:   OBESITY [name] is here to discuss his progress with his obesity treatment plan along with follow-up of his obesity related diagnoses. [name] is on the Category 4 Plan and states he is following his eating plan approximately 80% of the time. [name] states he is doing home workouts 35 minutes 3 times per week.  Today's visit was #: 35 Starting weight: 253 lbs Starting date: 02/16/2017 Today's weight: 234 lbs Today's date: 01/02/2020 Total lbs lost to date: 19 Total lbs lost since last in-office visit: 0  Interim History: [name] states that if he deviates from the plan, it's at night at work. He has started working out again. His goal is to decrease alcohol consumption.   Subjective:   Vitamin D deficiency. [name] is on prescription Vitamin D supplementation. No nausea, vomiting, or muscle weakness. He is due for labs.   Ref. Range 07/04/2019 13:16  Vitamin D, 25-Hydroxy Latest Ref Range: 30.0 - 100.0 ng/mL 46.9   Other specified hypothyroidism. [name] is on levothyroxine. He denies excess fatigue or hair loss.  Lab Results  Component Value Date   TSH 3.490 01/27/2019   Hyperglycemia. [name] has a history of some elevated blood glucose readings without a diagnosis of diabetes. He is on no medication. He reports cravings intermittently.  Other hyperlipidemia. [name] is on no medication. No chest pain. He is due for labs.  Lab Results  Component Value Date   CHOL 160 07/04/2019   HDL 55 07/04/2019   LDLCALC 87 07/04/2019   TRIG 97 07/04/2019   Lab Results  Component Value Date   ALT 31 07/04/2019   AST 19 07/04/2019   ALKPHOS 56 07/04/2019   BILITOT 0.6 07/04/2019   The 10-year ASCVD risk score Denman George DC Jr., et al., 2013) is: 3.9%   Values used to calculate the score:     Age: 9 years     Sex: Male     Is Non-Hispanic African American: No     Diabetic: No     Tobacco smoker: No     Systolic Blood Pressure: 135 mmHg     Is BP  treated: No     HDL Cholesterol: 55 mg/dL     Total Cholesterol: 160 mg/dL  At risk for diabetes mellitus. [name] is at higher than average risk for developing diabetes due to obesity.   Assessment/Plan:   Vitamin D deficiency. Low Vitamin D level contributes to fatigue and are associated with obesity, breast, and colon cancer. He was given a refill on his prescription Vitamin D every week #4 with 0 refills and VITAMIN D 25 Hydroxy (Vit-D Deficiency, Fractures) level will be checked today.   Other specified hypothyroidism. Patient with long-standing hypothyroidism, on levothyroxine therapy. He appears euthyroid. Orders and follow up as documented in patient record. Refill was given for levothyroxine #90.  Counseling . Good thyroid control is important for overall health. Supratherapeutic thyroid levels are dangerous and will not improve weight loss results. . The correct way to take levothyroxine is fasting, with water, separated by at least 30 minutes from breakfast, and separated by more than 4 hours from calcium, iron, multivitamins, acid reflux medications (PPIs).   Hyperglycemia. Fasting labs will be obtained and results with be discussed with [name] in 2 weeks at his follow up visit. In the meanwhile [name] was started on a lower simple carbohydrate diet and will work on weight loss efforts. A1c and insulin levels will be checked today.  Other  hyperlipidemia. Cardiovascular risk and specific lipid/LDL goals reviewed.  We discussed several lifestyle modifications today and [name] will continue to work on diet, exercise and weight loss efforts. Orders and follow up as documented in patient record. Labs will be checked today.   Counseling Intensive lifestyle modifications are the first line treatment for this issue. . Dietary changes: Increase soluble fiber. Decrease simple carbohydrates. . Exercise changes: Moderate to vigorous-intensity aerobic activity 150 minutes per week if  tolerated. . Lipid-lowering medications: see documented in medical record.  At risk for diabetes mellitus. [name] was given approximately 15 minutes of diabetes education and counseling today. We discussed intensive lifestyle modifications today with an emphasis on weight loss as well as increasing exercise and decreasing simple carbohydrates in his diet. We also reviewed medication options with an emphasis on risk versus benefit of those discussed.   Repetitive spaced learning was employed today to elicit superior memory formation and behavioral change.  Class 1 obesity with serious comorbidity and body mass index (BMI) of 31.0 to 31.9 in adult, unspecified obesity type. [name] will start Wegovy 0.25 SQ once weekly #4 with 0 refills.  [name] is currently in the action stage of change. As such, his goal is to continue with weight loss efforts. He has agreed to the Category 4 Plan.   Exercise goals: For substantial health benefits, adults should do at least 150 minutes (2 hours and 30 minutes) a week of moderate-intensity, or 75 minutes (1 hour and 15 minutes) a week of vigorous-intensity aerobic physical activity, or an equivalent combination of moderate- and vigorous-intensity aerobic activity. Aerobic activity should be performed in episodes of at least 10 minutes, and preferably, it should be spread throughout the week.  Behavioral modification strategies: meal planning and cooking strategies and better snacking choices.  [name] has agreed to follow-up with our clinic in 4 weeks. He was informed of the importance of frequent follow-up visits to maximize his success with intensive lifestyle modifications for his multiple health conditions.   [name] was informed we would discuss his lab results at his next visit unless there is a critical issue that needs to be addressed sooner. [name] agreed to keep his next visit at the agreed upon time to discuss these results.  Objective:   Blood pressure  135/77, pulse 60, temperature 97.8 F (36.6 C), height 6' (1.829 m), weight 234 lb (106.1 kg), SpO2 97 %. Body mass index is 31.74 kg/m.  General: Cooperative, alert, well developed, in no acute distress. HEENT: Conjunctivae and lids unremarkable. Cardiovascular: Regular rhythm.  Lungs: Normal work of breathing. Neurologic: No focal deficits.   Lab Results  Component Value Date   CREATININE 0.86 07/04/2019   BUN 17 07/04/2019   NA 141 07/04/2019   K 4.3 07/04/2019   CL 105 07/04/2019   CO2 22 07/04/2019   Lab Results  Component Value Date   ALT 31 07/04/2019   AST 19 07/04/2019   ALKPHOS 56 07/04/2019   BILITOT 0.6 07/04/2019   Lab Results  Component Value Date   HGBA1C 5.1 07/04/2019   HGBA1C 5.2 01/27/2019   HGBA1C 5.2 09/07/2018   HGBA1C 5.1 03/15/2018   HGBA1C 5.2 11/02/2017   Lab Results  Component Value Date   INSULIN 6.4 07/04/2019   INSULIN 8.2 01/27/2019   INSULIN 8.9 09/07/2018   INSULIN 6.1 03/15/2018   INSULIN 8.8 11/02/2017   Lab Results  Component Value Date   TSH 3.490 01/27/2019   Lab Results  Component Value Date  CHOL 160 07/04/2019   HDL 55 07/04/2019   LDLCALC 87 07/04/2019   TRIG 97 07/04/2019   Lab Results  Component Value Date   WBC 4.7 06/22/2017   HGB 15.5 06/22/2017   HCT 46.5 06/22/2017   MCV 84 06/22/2017   PLT 173 01/09/2017   No results found for: IRON, TIBC, FERRITIN  Attestation Statements:   Reviewed by clinician on day of visit: allergies, medications, problem list, medical history, surgical history, family history, social history, and previous encounter notes.  IMarianna Payment, am acting as transcriptionist for Alois Cliche, PA-C   I have reviewed the above documentation for accuracy and completeness, and I agree with the above. Alois Cliche, PA-C

## 2020-01-03 LAB — COMPREHENSIVE METABOLIC PANEL
ALT: 48 IU/L — ABNORMAL HIGH (ref 0–44)
AST: 25 IU/L (ref 0–40)
Albumin/Globulin Ratio: 2.1 (ref 1.2–2.2)
Albumin: 5 g/dL — ABNORMAL HIGH (ref 3.8–4.9)
Alkaline Phosphatase: 63 IU/L (ref 44–121)
BUN/Creatinine Ratio: 15 (ref 9–20)
BUN: 15 mg/dL (ref 6–24)
Bilirubin Total: 0.7 mg/dL (ref 0.0–1.2)
CO2: 21 mmol/L (ref 20–29)
Calcium: 9.6 mg/dL (ref 8.7–10.2)
Chloride: 101 mmol/L (ref 96–106)
Creatinine, Ser: 1.01 mg/dL (ref 0.76–1.27)
GFR calc Af Amer: 97 mL/min/{1.73_m2} (ref >59)
GFR calc non Af Amer: 84 mL/min/{1.73_m2} (ref >59)
Globulin, Total: 2.4 g/dL (ref 1.5–4.5)
Glucose: 93 mg/dL (ref 65–99)
Potassium: 4.2 mmol/L (ref 3.5–5.2)
Sodium: 141 mmol/L (ref 134–144)
Total Protein: 7.4 g/dL (ref 6.0–8.5)

## 2020-01-03 LAB — THYROID PANEL WITH TSH
Free Thyroxine Index: 1.9 (ref 1.2–4.9)
T3 Uptake Ratio: 30 % (ref 24–39)
T4, Total: 6.2 ug/dL (ref 4.5–12.0)
TSH: 3.4 u[IU]/mL (ref 0.450–4.500)

## 2020-01-03 LAB — INSULIN, RANDOM: INSULIN: 12.1 u[IU]/mL (ref 2.6–24.9)

## 2020-01-03 LAB — HEMOGLOBIN A1C
Est. average glucose Bld gHb Est-mCnc: 105 mg/dL
Hgb A1c MFr Bld: 5.3 % (ref 4.8–5.6)

## 2020-01-03 LAB — LIPID PANEL
Chol/HDL Ratio: 4.3 ratio (ref 0.0–5.0)
Cholesterol, Total: 181 mg/dL (ref 100–199)
HDL: 42 mg/dL (ref >39)
LDL Chol Calc (NIH): 102 mg/dL — ABNORMAL HIGH (ref 0–99)
Triglycerides: 212 mg/dL — ABNORMAL HIGH (ref 0–149)
VLDL Cholesterol Cal: 37 mg/dL (ref 5–40)

## 2020-01-03 LAB — VITAMIN D 25 HYDROXY (VIT D DEFICIENCY, FRACTURES): Vit D, 25-Hydroxy: 48.3 ng/mL (ref 30.0–100.0)

## 2020-01-30 ENCOUNTER — Ambulatory Visit (INDEPENDENT_AMBULATORY_CARE_PROVIDER_SITE_OTHER): Payer: 59 | Admitting: Physician Assistant

## 2021-07-28 ENCOUNTER — Encounter (HOSPITAL_COMMUNITY): Payer: Self-pay

## 2021-07-28 ENCOUNTER — Emergency Department (HOSPITAL_COMMUNITY)
Admission: EM | Admit: 2021-07-28 | Discharge: 2021-07-28 | Disposition: A | Discharge status: Home / Self Care | Payer: BC Managed Care – PPO | Attending: Emergency Medicine | Admitting: Emergency Medicine

## 2021-07-28 DIAGNOSIS — T782XXA Anaphylactic shock, unspecified, initial encounter: Secondary | ICD-10-CM

## 2021-07-28 DIAGNOSIS — W57XXXA Bitten or stung by nonvenomous insect and other nonvenomous arthropods, initial encounter: Secondary | ICD-10-CM | POA: Insufficient documentation

## 2021-07-28 DIAGNOSIS — T7840XA Allergy, unspecified, initial encounter: Secondary | ICD-10-CM | POA: Diagnosis not present

## 2021-07-28 DIAGNOSIS — R21 Rash and other nonspecific skin eruption: Secondary | ICD-10-CM | POA: Diagnosis present

## 2021-07-28 MED ORDER — SODIUM CHLORIDE 0.9 % IV BOLUS
1000.0000 mL | Freq: Once | INTRAVENOUS | Status: AC
  Administered 2021-07-28: 1000 mL via INTRAVENOUS

## 2021-07-28 MED ORDER — FAMOTIDINE IN NACL 20-0.9 MG/50ML-% IV SOLN
20.0000 mg | Freq: Once | INTRAVENOUS | Status: AC
  Administered 2021-07-28: 20 mg via INTRAVENOUS
  Filled 2021-07-28: qty 50

## 2021-07-28 MED ORDER — EPINEPHRINE 0.3 MG/0.3ML IJ SOAJ: 0.3000 mg | INTRAMUSCULAR | 0 refills | Status: AC | PRN

## 2021-07-28 MED ORDER — SODIUM CHLORIDE 0.9 % IV SOLN: INTRAVENOUS | Status: DC

## 2021-07-28 MED ORDER — PREDNISONE 20 MG PO TABS
40.0000 mg | ORAL_TABLET | Freq: Every day | ORAL | 0 refills | Status: AC
Start: 2021-07-28 — End: 2021-07-31

## 2021-07-28 NOTE — ED Triage Notes (Signed)
PT arrives via EMS. PT was swimming in his pool and felt like his was bitten by something around 1450. Pt began having an allergic reaction, broke out in hives, had sob, and some swelling to his face. Pt went to UC initially. They administered 60mg  of prednisone, 1 albuterol treatment, and 0.3mg  of epi at 1523. Patient's symptoms have improved. EMS additionally administered 50mg  of IV benadryl, 4mg  of zofran, and 125mg  of Solumedrol

## 2021-07-28 NOTE — Discharge Instructions (Signed)
Continue using Benadryl 25 mg every 6 hours as needed.  Take your next dose of prednisone tomorrow which is your steroid.  I have prescribed you epinephrine pens if you develop worsening rash with respiratory symptoms.  Follow-up with allergist and your primary care doctor.

## 2021-07-28 NOTE — ED Provider Notes (Signed)
[facility]-EMERGENCY DEPT Provider Note   CSN: [id number] Arrival date & time: 07/28/21  1622     History  Chief Complaint  Patient presents with   Allergic Reaction    Tray AKSHAJ Harrison is a 56 y.o. male.  Patient here after allergic reaction.  He was swimming in the pool and felt like something bit him on his right side of his chest.  Broke out into hives with swelling of his face.  Went to urgent care and was given prednisone, albuterol, IM epinephrine.  He had improvement of his symptoms.  Still with large rash to his right side of his chest, face and ear.  No difficulty breathing.  No GI symptoms.  Additionally he got IV Benadryl, Zofran, Solu-Medrol with EMS.  No known allergy history.  The history is provided by the patient.       Home Medications Prior to Admission medications   Medication Sig Start Date End Date Taking? Authorizing Provider  EPINEPHrine 0.3 mg/0.3 mL IJ SOAJ injection Inject 0.3 mg into the muscle as needed for anaphylaxis. 07/28/21  Yes Lavaeh Bau, DO  predniSONE (DELTASONE) 20 MG tablet Take 2 tablets (40 mg total) by mouth daily for 3 days. 07/28/21 07/31/21 Yes Dene Nazir, DO  HUMIRA PEN 40 MG/0.8ML PNKT Inject 40 mg into the skin See admin instructions. Twice monthly (1st & 15th) 07/26/14   [provider]  levothyroxine (SYNTHROID) 75 MCG tablet Take 1 tablet (75 mcg total) by mouth daily before breakfast. 01/02/20   Alois Cliche, PA-C  meloxicam (MOBIC) 15 MG tablet Take 15 mg by mouth daily as needed.     [provider]  methocarbamol (ROBAXIN) 750 MG tablet TAKE 1 TABLET DAILY PRN 12/19/16   [provider]  Semaglutide-Weight Management (WEGOVY) 0.25 MG/0.5ML SOAJ Inject 0.25 mg into the skin once a week. 01/02/20   Alois Cliche, PA-C  traMADol (ULTRAM) 50 MG tablet Take by mouth every 6 (six) hours as needed.    [provider]  Vitamin D, Ergocalciferol, (DRISDOL) 1.25 MG (50000  UNIT) CAPS capsule Take 1 capsule (50,000 Units total) by mouth every 7 (seven) days. 01/02/20   Alois Cliche, PA-C      Allergies    Patient has no known allergies.    Review of Systems   Review of Systems  Physical Exam Updated Vital Signs BP (!) 110/55   Pulse 67   Temp (!) 97.4 F (36.3 C) (Oral)   Resp 19   Ht 6' (1.829 m)   Wt 106.1 kg   SpO2 94%   BMI 31.74 kg/m  Physical Exam Vitals and nursing note reviewed.  Constitutional:      General: He is not in acute distress.    Appearance: He is well-developed. He is not ill-appearing.  HENT:     Head: Normocephalic and atraumatic.     Nose: Nose normal.     Mouth/Throat:     Mouth: Mucous membranes are moist.  Eyes:     Extraocular Movements: Extraocular movements intact.     Conjunctiva/sclera: Conjunctivae normal.     Pupils: Pupils are equal, round, and reactive to light.  Cardiovascular:     Rate and Rhythm: Normal rate and regular rhythm.     Pulses: Normal pulses.     Heart sounds: Normal heart sounds. No murmur heard. Pulmonary:     Effort: Pulmonary effort is normal. No respiratory distress.     Breath sounds: Normal breath sounds.  Abdominal:  Palpations: Abdomen is soft.     Tenderness: There is no abdominal tenderness.  Musculoskeletal:        General: No swelling.     Cervical back: Normal range of motion and neck supple.  Skin:    General: Skin is warm and dry.     Capillary Refill: Capillary refill takes less than 2 seconds.     Findings: Rash present.     Comments: Hives throughout the right side of his chest wall, right side of his neck, face, ears, no major swelling of the lips or tongue  Neurological:     General: No focal deficit present.     Mental Status: He is alert.  Psychiatric:        Mood and Affect: Mood normal.     ED Results / Procedures / Treatments   Labs (all labs ordered are listed, but only abnormal results are displayed) Labs Reviewed - No data to  display  EKG None  Radiology No results found.  Procedures Procedures    Medications Ordered in ED Medications  sodium chloride 0.9 % bolus 1,000 mL (0 mLs Intravenous Stopped 07/28/21 1836)  famotidine (PEPCID) IVPB 20 mg premix (0 mg Intravenous Stopped 07/28/21 1718)    ED Course/ Medical Decision Making/ A&P                           Medical Decision Making Risk Prescription drug management.   [name] is here after anaphylactic reaction to bug bite.  Unremarkable vitals here.  Seen by urgent care and given steroids, IM epinephrine.  Given Benadryl with EMS.  Symptoms are improving.  EpiPen was given about 45 minutes ago.  No history of allergies.  He appears to be improving.  We will give him a dose of IV Pepcid and IV fluids and continue to monitor.  On reevaluation patient continued to improve.  Hives have greatly resolved.  He has been prescribed EpiPen, prednisone.  Recommend continued use of Benadryl.  Referred to allergist.  This chart was dictated using voice recognition software.  Despite best efforts to proofread,  errors can occur which can change the documentation meaning.         Final Clinical Impression(s) / ED Diagnoses Final diagnoses:  Allergic reaction, initial encounter  Anaphylaxis, initial encounter    Rx / DC Orders ED Discharge Orders          Ordered    EPINEPHrine 0.3 mg/0.3 mL IJ SOAJ injection  As needed        07/28/21 1843    predniSONE (DELTASONE) 20 MG tablet  Daily        07/28/21 1843              Virgina Norfolk, DO 07/28/21 1918

## 2021-08-21 ENCOUNTER — Encounter (INDEPENDENT_AMBULATORY_CARE_PROVIDER_SITE_OTHER): Payer: Self-pay

## 2021-09-02 NOTE — Progress Notes (Unsigned)
New Patient Note  RE: [name] MRN: [id number] DOB: Dec 11, 1965 Date of Office Visit: 09/03/2021  Consult requested by: Laurann Montana, MD Primary care provider: Laurann Montana, MD  Chief Complaint: No chief complaint on file.  History of Present Illness: I had the pleasure of seeing [name] for initial evaluation at the [facility] on 09/02/2021. He is a 56 y.o. male, who is referred here by Laurann Montana, MD for the evaluation of allergic reaction.  07/28/2021 ER visit: "Patient here after allergic reaction.  He was swimming in the pool and felt like something bit him on his right side of his chest.  Broke out into hives with swelling of his face.  Went to urgent care and was given prednisone, albuterol, IM epinephrine.  He had improvement of his symptoms.  Still with large rash to his right side of his chest, face and ear.  No difficulty breathing.  No GI symptoms.  Additionally he got IV Benadryl, Zofran, Solu-Medrol with EMS.  No known allergy history.  [name] is here after anaphylactic reaction to bug bite.  Unremarkable vitals here.  Seen by urgent care and given steroids, IM epinephrine.  Given Benadryl with EMS.  Symptoms are improving.  EpiPen was given about 45 minutes ago.  No history of allergies.  He appears to be improving.  We will give him a dose of IV Pepcid and IV fluids and continue to monitor.   On reevaluation patient continued to improve.  Hives have greatly resolved.  He has been prescribed EpiPen, prednisone.  Recommend continued use of Benadryl.  Referred to allergist."  Assessment and Plan: [name] is a 56 y.o. male with: No problem-specific Assessment & Plan notes found for this encounter.  No follow-ups on file.  No orders of the defined types were placed in this encounter.  Lab Orders  No laboratory test(s) ordered today    Other allergy screening: Asthma: {Blank single:19197::"yes","no"} Rhino conjunctivitis:  {Blank single:19197::"yes","no"} Food allergy: {Blank single:19197::"yes","no"} Medication allergy: {Blank single:19197::"yes","no"} Hymenoptera allergy: {Blank single:19197::"yes","no"} Urticaria: {Blank single:19197::"yes","no"} Eczema:{Blank single:19197::"yes","no"} History of recurrent infections suggestive of immunodeficency: {Blank single:19197::"yes","no"}  Diagnostics: Spirometry:  Tracings reviewed. His effort: {Blank single:19197::"Good reproducible efforts.","It was hard to get consistent efforts and there is a question as to whether this reflects a maximal maneuver.","Poor effort, data can not be interpreted."} FVC: ***L FEV1: ***L, ***% predicted FEV1/FVC ratio: ***% Interpretation: {Blank single:19197::"Spirometry consistent with mild obstructive disease","Spirometry consistent with moderate obstructive disease","Spirometry consistent with severe obstructive disease","Spirometry consistent with possible restrictive disease","Spirometry consistent with mixed obstructive and restrictive disease","Spirometry uninterpretable due to technique","Spirometry consistent with normal pattern","No overt abnormalities noted given today's efforts"}.  Please see scanned spirometry results for details.  Skin Testing: {Blank single:19197::"Select foods","Environmental allergy panel","Environmental allergy panel and select foods","Food allergy panel","None","Deferred due to recent antihistamines use"}. *** Results discussed with patient/family.   Past Medical History: Patient Active Problem List   Diagnosis Date Noted  . Vitamin D deficiency 03/03/2017  . Insulin resistance 03/03/2017  . Other fatigue 02/16/2017  . NAFLD (nonalcoholic fatty liver disease) 49/44/9675  . Other specified hypothyroidism 02/16/2017  . Difficulty with CPAP use 09/22/2016  . Obstructive sleep apnea syndrome 09/22/2016  . Night terrors, adult 09/20/2014  . Left-sided low back pain with left-sided sciatica  09/20/2014  . ADHD, hyperactive-impulsive type 08/16/2014  . Hypersomnia with sleep apnea 08/16/2014  . OSA on CPAP 08/16/2014  . Adult night terrors 03/29/2014  . Snoring 03/29/2014  . Obesity 03/29/2014  .  Headache, migraine 03/29/2014  . Night terrors    Past Medical History:  Diagnosis Date  . ADD (attention deficit disorder)   . Adult night terrors 03/29/2014  . Alcohol abuse   . Ankylosing spondylitis (HCC)    anklosing spondolitis  . Anxiety   . Asthma    EXERCISE INDUCED  RARELY  USES INHALER  . Back pain   . Depression   . Drug use   . Fatty liver   . Hypothyroidism   . IBS (irritable bowel syndrome)   . Joint pain   . Night terrors   . Sleep apnea    SLEEPS W/ MOUTH GUARD    Past Surgical History: Past Surgical History:  Procedure Laterality Date  . KNEE SURGERY Left   . LUMBAR LAMINECTOMY/DECOMPRESSION MICRODISCECTOMY N/A 01/14/2017   Procedure: LUMBAR 3-4 DECOMPRESSION;  Surgeon: Estill Bamberg, MD;  Location: [facility] OR;  Service: Orthopedics;  Laterality: N/A;  LUMBAR 2-4 DECOMPRESSION / 2 HRS FOR CASE.  Marland Kitchen NASAL SEPTOPLASTY W/ TURBINOPLASTY Bilateral 12/28/2017   Procedure: NASAL SEPTOPLASTY WITH TURBINATE REDUCTION;  Surgeon: Serena Colonel, MD;  Location: [facility];  Service: ENT;  Laterality: Bilateral;  . TONSILLECTOMY AND ADENOIDECTOMY  1992  . WISDOM TOOTH EXTRACTION     Medication List:  Current Outpatient Medications  Medication Sig Dispense Refill  . EPINEPHrine 0.3 mg/0.3 mL IJ SOAJ injection Inject 0.3 mg into the muscle as needed for anaphylaxis. 1 each 0  . HUMIRA PEN 40 MG/0.8ML PNKT Inject 40 mg into the skin See admin instructions. Twice monthly (1st & 15th)  1  . levothyroxine (SYNTHROID) 75 MCG tablet Take 1 tablet (75 mcg total) by mouth daily before breakfast. 90 tablet 0  . meloxicam (MOBIC) 15 MG tablet Take 15 mg by mouth daily as needed.     . methocarbamol (ROBAXIN) 750 MG tablet TAKE 1 TABLET DAILY PRN  2  .  Semaglutide-Weight Management (WEGOVY) 0.25 MG/0.5ML SOAJ Inject 0.25 mg into the skin once a week. 2 mL 0  . traMADol (ULTRAM) 50 MG tablet Take by mouth every 6 (six) hours as needed.    . Vitamin D, Ergocalciferol, (DRISDOL) 1.25 MG (50000 UNIT) CAPS capsule Take 1 capsule (50,000 Units total) by mouth every 7 (seven) days. 4 capsule 0   No current facility-administered medications for this visit.   Allergies: No Known Allergies Social History: Social History   Socioeconomic History  . Marital status: Married    Spouse name: Not on file  . Number of children: 3  . Years of education: 109  . Highest education level: Not on file  Occupational History  . Not on file  Tobacco Use  . Smoking status: Former    Years: 4.00    Types: Cigarettes    Quit date: 01/16/1986    Years since quitting: 35.6  . Smokeless tobacco: Never  Vaping Use  . Vaping Use: Never used  Substance and Sexual Activity  . Alcohol use: Yes    Alcohol/week: 0.0 standard drinks of alcohol    Comment: Two drinks daily  . Drug use: Yes    Types: Marijuana  . Sexual activity: Not on file  Other Topics Concern  . Not on file  Social History Narrative   Patient lives at home with his wife [name]).   Patient works full time [employer].   Education some college.   Right handed.   Caffeine two cups of coffee daily.    Social Determinants of Health  Financial Resource Strain: Not on file  Food Insecurity: Not on file  Transportation Needs: Not on file  Physical Activity: Not on file  Stress: Not on file  Social Connections: Not on file   Lives in a ***. Smoking: *** Occupation: ***  Environmental HistorySurveyor, minerals in the house: Copywriter, advertising in the family room: {Blank single:19197::"yes","no"} Carpet in the bedroom: {Blank single:19197::"yes","no"} Heating: {Blank single:19197::"electric","gas","heat pump"} Cooling: {Blank  single:19197::"central","window","heat pump"} Pet: {Blank single:19197::"yes ***","no"}  Family History: Family History  Problem Relation Age of Onset  . Aneurysm Mother   . Sudden death Mother   . Stroke Father   . Hypertension Father   . Thyroid disease Father   . Alcoholism Father   . Aneurysm Other        brain   Problem                               Relation Asthma                                   *** Eczema                                *** Food allergy                          *** Allergic rhino conjunctivitis     ***  Review of Systems  Constitutional:  Negative for appetite change, chills, fever and unexpected weight change.  HENT:  Negative for congestion and rhinorrhea.   Eyes:  Negative for itching.  Respiratory:  Negative for cough, chest tightness, shortness of breath and wheezing.   Cardiovascular:  Negative for chest pain.  Gastrointestinal:  Negative for abdominal pain.  Genitourinary:  Negative for difficulty urinating.  Skin:  Negative for rash.  Neurological:  Negative for headaches.   Objective: There were no vitals taken for this visit. There is no height or weight on file to calculate BMI. Physical Exam Vitals and nursing note reviewed.  Constitutional:      Appearance: Normal appearance. He is well-developed.  HENT:     Head: Normocephalic and atraumatic.     Right Ear: Tympanic membrane and external ear normal.     Left Ear: Tympanic membrane and external ear normal.     Nose: Nose normal.     Mouth/Throat:     Mouth: Mucous membranes are moist.     Pharynx: Oropharynx is clear.  Eyes:     Conjunctiva/sclera: Conjunctivae normal.  Cardiovascular:     Rate and Rhythm: Normal rate and regular rhythm.     Heart sounds: Normal heart sounds. No murmur heard.    No friction rub. No gallop.  Pulmonary:     Effort: Pulmonary effort is normal.     Breath sounds: Normal breath sounds. No wheezing, rhonchi or rales.  Musculoskeletal:      Cervical back: Neck supple.  Skin:    General: Skin is warm.     Findings: No rash.  Neurological:     Mental Status: He is alert and oriented to person, place, and time.  Psychiatric:        Behavior: Behavior normal.  The plan was reviewed with the patient/family, and all questions/concerned were addressed.  It was  my pleasure to see [name] today and participate in his care. Please feel free to contact me with any questions or concerns.  Sincerely,  Wyline Mood, DO Allergy & Immunology  [facility] office: [phone] [facility] office: [phone]

## 2021-09-03 ENCOUNTER — Ambulatory Visit: Payer: BC Managed Care – PPO | Admitting: Allergy

## 2021-09-03 ENCOUNTER — Encounter: Payer: Self-pay | Admitting: Allergy

## 2021-09-03 VITALS — BP 120/86 | HR 79 | Temp 98.1°F | Ht 71.5 in | Wt 235.0 lb

## 2021-09-03 DIAGNOSIS — J4520 Mild intermittent asthma, uncomplicated: Secondary | ICD-10-CM | POA: Diagnosis not present

## 2021-09-03 DIAGNOSIS — Z91038 Other insect allergy status: Secondary | ICD-10-CM | POA: Diagnosis not present

## 2021-09-03 DIAGNOSIS — L500 Allergic urticaria: Secondary | ICD-10-CM

## 2021-09-03 DIAGNOSIS — T7840XA Allergy, unspecified, initial encounter: Secondary | ICD-10-CM | POA: Insufficient documentation

## 2021-09-03 DIAGNOSIS — Z8709 Personal history of other diseases of the respiratory system: Secondary | ICD-10-CM | POA: Insufficient documentation

## 2021-09-03 DIAGNOSIS — T7840XD Allergy, unspecified, subsequent encounter: Secondary | ICD-10-CM

## 2021-09-03 NOTE — Assessment & Plan Note (Addendum)
Allergic reaction in July while outdoors. Not sure what bit him but developed whole body rash, hives, diaphoresis, labored breathing, hypotension requiring IM epi, albuterol and prednisone in the UC. Transferred to ER via EMS where he was treated with IV benadryl, Zofran and solumedrol. No prior allergic reactions. Usually gets large localized reactions with stings.  . Skin testing negative to fire ant today - we only have fire ant readily available for skin testing. Rest of the hymenoptera panel testing is done at our [facility] office once a month only.  . Based on clinical history and insect bite mark on torso concerning for allergic reaction after insect sting.  . Avoid stinging insects. . Epinephrine injectable device - demonstrated proper use. For mild symptoms you can take over the counter antihistamines such as Benadryl and monitor symptoms closely. If symptoms worsen or if you have severe symptoms including breathing issues, throat closure, significant swelling, whole body hives, severe diarrhea and vomiting, lightheadedness then inject epinephrine and seek immediate medical care afterwards. . Emergency action plan given. . Get bloodwork o If positive will recommend injections. o If negative will need skin testing to hymenoptera panel. This is done at our [facility] office.

## 2021-09-03 NOTE — Patient Instructions (Addendum)
Allergic reaction Skin testing negative to fire ant today. Avoid stinging insects. Epinephrine injectable device - demonstrated proper use. For mild symptoms you can take over the counter antihistamines such as Benadryl and monitor symptoms closely. If symptoms worsen or if you have severe symptoms including breathing issues, throat closure, significant swelling, whole body hives, severe diarrhea and vomiting, lightheadedness then inject epinephrine and seek immediate medical care afterwards. Emergency action plan given.  Get bloodwork If positive will recommend injections. If negative will need to testing to stinging insects at our [name] office.  We are ordering labs, so please allow 1-2 weeks for the results to come back. With the newly implemented Cures Act, the labs might be visible to you at the same time that they become visible to me. However, I will not address the results until all of the results are back, so please be patient.  In the meantime, continue recommendations in your patient instructions, including avoidance measures (if applicable), until you hear from me.  Breathing Normal breathing test today. May use albuterol rescue inhaler 2 puffs every 4 to 6 hours as needed for shortness of breath, chest tightness, coughing, and wheezing. Monitor frequency of use.   Follow up in 1 year or sooner if needed.

## 2021-09-03 NOTE — Assessment & Plan Note (Signed)
Uses albuterol on rare occasions. Main trigger is heavy exertion.  Today's spirometry was normal.   May use albuterol rescue inhaler 2 puffs every 4 to 6 hours as needed for shortness of breath, chest tightness, coughing, and wheezing. Monitor frequency of use.

## 2021-09-07 LAB — ALLERGEN HYMENOPTERA PANEL
Bumblebee: <0.1 kU/L
Honeybee IgE: <0.1 kU/L
Hornet, White Face, IgE: 0.45 kU/L — AB
Hornet, Yellow, IgE: 0.13 kU/L — AB
Paper Wasp IgE: 1.03 kU/L — AB
Yellow Jacket, IgE: 1.32 kU/L — AB

## 2021-09-07 LAB — TRYPTASE: Tryptase: 6.3 ug/L (ref 2.2–13.2)

## 2021-09-07 LAB — ALLERGEN FIRE ANT: I070-IgE Fire Ant (Invicta): <0.1 kU/L

## 2021-09-09 NOTE — Progress Notes (Signed)
Please call patient.  Reviewed bloodwork. Positive to yellow jacket, wasp and borderline to white face hornet. Negative to fire ant and normal tryptase level (this checks for a mast cell disease).  Based on these results and clinical history - concerning if you got stung by one of the above insects that caused the reaction.  Recommend a medical alert bracelet for venom allergy, make sure you have your epipen with you especially when outdoors.  Recommend starting injections for this - you would get 2 shots each time.

## 2022-06-24 ENCOUNTER — Other Ambulatory Visit: Payer: Self-pay | Admitting: Family Medicine

## 2022-06-24 ENCOUNTER — Other Ambulatory Visit (HOSPITAL_BASED_OUTPATIENT_CLINIC_OR_DEPARTMENT_OTHER): Payer: Self-pay | Admitting: Family Medicine

## 2022-06-24 DIAGNOSIS — R0789 Other chest pain: Secondary | ICD-10-CM

## 2022-06-24 DIAGNOSIS — Z8249 Family history of ischemic heart disease and other diseases of the circulatory system: Secondary | ICD-10-CM

## 2022-07-01 ENCOUNTER — Encounter: Payer: Self-pay | Admitting: Family Medicine

## 2022-07-02 ENCOUNTER — Ambulatory Visit
Admission: RE | Admit: 2022-07-02 | Discharge: 2022-07-02 | Disposition: A | Discharge status: Home / Self Care | Payer: BC Managed Care – PPO | Source: Ambulatory Visit | Attending: Family Medicine | Admitting: Family Medicine

## 2022-07-02 DIAGNOSIS — Z8249 Family history of ischemic heart disease and other diseases of the circulatory system: Secondary | ICD-10-CM

## 2022-07-09 ENCOUNTER — Ambulatory Visit (HOSPITAL_COMMUNITY)
Admission: RE | Admit: 2022-07-09 | Discharge: 2022-07-09 | Disposition: A | Discharge status: Home / Self Care | Payer: BC Managed Care – PPO | Source: Ambulatory Visit | Attending: Family Medicine | Admitting: Family Medicine

## 2022-07-09 DIAGNOSIS — R0789 Other chest pain: Secondary | ICD-10-CM | POA: Insufficient documentation
